# Patient Record
Sex: Male | Born: 1945 | Race: Black or African American | Hispanic: No | Marital: Married | Smoking: Former smoker
Health system: Southern US, Community
[De-identification: ages and names within clinical notes are randomized; demographics above are authoritative.]

## PROBLEM LIST (undated history)

## (undated) DIAGNOSIS — R569 Unspecified convulsions: Secondary | ICD-10-CM

## (undated) DIAGNOSIS — R64 Cachexia: Secondary | ICD-10-CM

## (undated) DIAGNOSIS — C3490 Malignant neoplasm of unspecified part of unspecified bronchus or lung: Secondary | ICD-10-CM

## (undated) DIAGNOSIS — Z720 Tobacco use: Secondary | ICD-10-CM

## (undated) DIAGNOSIS — Z77011 Contact with and (suspected) exposure to lead: Secondary | ICD-10-CM

## (undated) DIAGNOSIS — E278 Other specified disorders of adrenal gland: Secondary | ICD-10-CM

## (undated) DIAGNOSIS — E236 Other disorders of pituitary gland: Secondary | ICD-10-CM

## (undated) DIAGNOSIS — N183 Chronic kidney disease, stage 3 unspecified: Secondary | ICD-10-CM

## (undated) DIAGNOSIS — E871 Hypo-osmolality and hyponatremia: Secondary | ICD-10-CM

## (undated) DIAGNOSIS — J449 Chronic obstructive pulmonary disease, unspecified: Secondary | ICD-10-CM

## (undated) DIAGNOSIS — N400 Enlarged prostate without lower urinary tract symptoms: Secondary | ICD-10-CM

## (undated) DIAGNOSIS — N529 Male erectile dysfunction, unspecified: Secondary | ICD-10-CM

## (undated) DIAGNOSIS — I214 Non-ST elevation (NSTEMI) myocardial infarction: Secondary | ICD-10-CM

## (undated) DIAGNOSIS — D696 Thrombocytopenia, unspecified: Secondary | ICD-10-CM

## (undated) DIAGNOSIS — I10 Essential (primary) hypertension: Secondary | ICD-10-CM

## (undated) DIAGNOSIS — I200 Unstable angina: Secondary | ICD-10-CM

## (undated) DIAGNOSIS — I2510 Atherosclerotic heart disease of native coronary artery without angina pectoris: Secondary | ICD-10-CM

## (undated) HISTORY — PX: FINGER AMPUTATION: SHX636

---

## 1998-08-25 ENCOUNTER — Emergency Department (HOSPITAL_COMMUNITY): Admission: EM | Admit: 1998-08-25 | Discharge: 1998-08-25 | Payer: Self-pay | Admitting: Emergency Medicine

## 2004-06-19 ENCOUNTER — Ambulatory Visit: Payer: Self-pay | Admitting: Internal Medicine

## 2004-06-27 ENCOUNTER — Ambulatory Visit: Payer: Self-pay | Admitting: Internal Medicine

## 2006-08-14 ENCOUNTER — Emergency Department (HOSPITAL_COMMUNITY): Admission: EM | Admit: 2006-08-14 | Discharge: 2006-08-14 | Payer: Self-pay | Admitting: Emergency Medicine

## 2007-01-13 ENCOUNTER — Encounter: Admission: RE | Admit: 2007-01-13 | Discharge: 2007-01-13 | Payer: Self-pay | Admitting: Otolaryngology

## 2009-01-17 ENCOUNTER — Encounter: Admission: RE | Admit: 2009-01-17 | Discharge: 2009-01-17 | Payer: Self-pay | Admitting: Endocrinology

## 2009-07-20 ENCOUNTER — Inpatient Hospital Stay (HOSPITAL_COMMUNITY): Admission: EM | Admit: 2009-07-20 | Discharge: 2009-07-25 | Payer: Self-pay | Admitting: Internal Medicine

## 2009-09-21 ENCOUNTER — Emergency Department (HOSPITAL_COMMUNITY): Admission: EM | Admit: 2009-09-21 | Discharge: 2009-09-22 | Payer: Self-pay | Admitting: Emergency Medicine

## 2009-10-24 ENCOUNTER — Emergency Department (HOSPITAL_COMMUNITY): Admission: EM | Admit: 2009-10-24 | Discharge: 2009-10-24 | Payer: Self-pay | Admitting: Emergency Medicine

## 2009-11-24 DIAGNOSIS — I10 Essential (primary) hypertension: Secondary | ICD-10-CM | POA: Insufficient documentation

## 2009-11-24 DIAGNOSIS — N529 Male erectile dysfunction, unspecified: Secondary | ICD-10-CM | POA: Insufficient documentation

## 2009-11-24 HISTORY — DX: Male erectile dysfunction, unspecified: N52.9

## 2009-11-25 ENCOUNTER — Ambulatory Visit: Payer: Self-pay | Admitting: Pulmonary Disease

## 2009-11-25 DIAGNOSIS — G40909 Epilepsy, unspecified, not intractable, without status epilepticus: Secondary | ICD-10-CM

## 2009-11-25 DIAGNOSIS — R0602 Shortness of breath: Secondary | ICD-10-CM | POA: Insufficient documentation

## 2010-01-09 ENCOUNTER — Encounter: Payer: Self-pay | Admitting: Pulmonary Disease

## 2010-01-20 ENCOUNTER — Ambulatory Visit: Payer: Self-pay | Admitting: Pulmonary Disease

## 2010-02-08 ENCOUNTER — Ambulatory Visit: Payer: Self-pay | Admitting: Pulmonary Disease

## 2010-02-08 DIAGNOSIS — J438 Other emphysema: Secondary | ICD-10-CM

## 2010-04-16 ENCOUNTER — Encounter: Payer: Self-pay | Admitting: Otolaryngology

## 2010-04-27 NOTE — Letter (Signed)
Summary: Registered letter  Registered letter   Imported By: Lester Haughton 01/31/2010 07:29:28  _____________________________________________________________________  External Attachment:    Type:   Image     Comment:   External Document

## 2010-04-27 NOTE — Assessment & Plan Note (Signed)
Summary: consult for dyspnea   Copy to:  Cristian Moss Primary Provider/Referring Provider:  Adrian Moss  CC:  Pulmonary Consult.  History of Present Illness: The pt is a 65y/o male who I have been asked to see for dyspnea.  He is a lifelong smoker, and was hospitalized in April of this year with a LLL infiltrate most likely due to CAP.  He had a f/u cxr in June which showed clearing.  He has had chest congestion and doe for the last one year, and feels it may be getting worse.  He describes a 2 block doe on flat ground at a moderate pace, but denies getting winded bringing groceries in from the car or trash cans down to the street.  He has chronic am cough of white foamy mucus, and clears as the day goes on.  He denies any cardiac history, and has no issues with LE edema.    Preventive Screening-Counseling & Management  Alcohol-Tobacco     Smoking Status: current     Smoking Cessation Counseling: yes     Packs/Day: 1.0     Year Started: 65 y/o     Tobacco Counseling: to quit use of tobacco products  Current Medications (verified): 1)  Levetiracetam 750 Mg Tabs (Levetiracetam) .... Take 2 Tabs By Mouth Two Times A Day 2)  Depakote 500 Mg Tbec (Divalproex Sodium) .... Take 1 Tab By Mouth Each Morning and 2 Tabs By Mouth Each Evening 3)  Cabergoline 0.5 Mg Tabs (Cabergoline) .... Take 1 Tab By Mouth Twice Weekly 4)  Lasix 20 Mg Tabs (Furosemide) .... Take 1 Tablet By Mouth Once A Day As Needed 5)  Topamax 25 Mg Tabs (Topiramate) .... Take As Directed  Allergies (verified): No Known Drug Allergies  Past History:  Past Medical History:  SEIZURE DISORDER (ICD-780.39) HYPERTENSION (ICD-401.9) ERECTILE DYSFUNCTION, ORGANIC (ICD-607.84)    Past Surgical History: 5th finger surgery on L hand approx 1986  Family History: Reviewed history and no changes required. asthma: daughter  Social History: Reviewed history from 11/24/2009 and no changes required. pt is married pt has 3  children. pt is retired.  prev built batteries pt smoking 1 ppd since age 47.Smoking Status:  current Packs/Day:  1.0  Review of Systems       The patient complains of productive cough, nasal congestion/difficulty breathing through nose, sneezing, and hand/feet swelling.  The patient denies shortness of breath with activity, shortness of breath at rest, non-productive cough, coughing up blood, chest pain, irregular heartbeats, acid heartburn, indigestion, loss of appetite, weight change, abdominal pain, difficulty swallowing, sore throat, tooth/dental problems, headaches, itching, ear ache, anxiety, depression, joint stiffness or pain, rash, change in color of mucus, and fever.    Vital Signs:  Patient profile:   65 year old male Weight:      163.38 pounds O2 Sat:      93 % on Room air Temp:     98.6 degrees F oral Pulse rate:   79 / minute BP sitting:   118 / 62  (left arm) Cuff size:   regular  Vitals Entered By: Arman Filter LPN (November 25, 2009 11:27 AM)  O2 Flow:  Room air CC: Pulmonary Consult Comments Medications reviewed with patient Arman Filter LPN  November 25, 2009 11:27 AM    Physical Exam  General:  wd male in nad Eyes:  PERRLA and EOMI.   Nose:  patent without discharge Mouth:  clear  Neck:  no jvd, or  palpable LN Lungs:  decreased bs throughout, no wheezing or rhonchi  Heart:  rrr, no mrg Abdomen:  soft and nontender, bs+ Extremities:  mild ankle edema, no cyanosis  pulses intact distally Neurologic:  alert and oriented, moves all 4.   Impression & Recommendations:  Problem # 1:  DYSPNEA (ICD-786.05) the pt has doe that I suspect is secondary to underlying obstructive lung disease, but he will need pfts for documentation.  I have stressed to him the importance of smoking cessation, and how that will also help his other airway symptoms such as cough and congestion.  Will see him back after pfts to review.  Medications Added to Medication List This  Visit: 1)  Levetiracetam 750 Mg Tabs (Levetiracetam) .... Take 2 tabs by mouth two times a day 2)  Lasix 20 Mg Tabs (Furosemide) .... Take 1 tablet by mouth once a day as needed 3)  Topamax 25 Mg Tabs (Topiramate) .... Take as directed  Other Orders: Consultation Level IV (45409) Pulmonary Referral (Pulmonary) Tobacco use cessation intermediate 3-10 minutes (81191)  Patient Instructions: 1)  will schedule for breathing studies, and see you back in followup on the same day. 2)  stop smoking, it is the one thing that will make your breathing better.  Appended Document: consult for dyspnea pt needs ov to review pfts.Marland Kitchen.he was supposed to see me on same day as pfts.  Appended Document: consult for dyspnea pt coming in on 11/15/ at 10:15

## 2010-04-27 NOTE — Assessment & Plan Note (Signed)
Summary: Cristian Moss for emphysema   Copy to:  Adrian Prince Primary Provider/Referring Provider:  Adrian Prince  CC:  pt here to discuss pft results. pt c/o wheezing at night and cough with ocass clear phlem. pt states currently smokes 1ppd. .  History of Present Illness: the pt comes in today for f/u of his recent pfts.  He was found to have moderate to severe airflow obstruction, but a 23% improvement in FEV1 with bronchodilators.  He had no restriction, but did have airtrapping on lung volumes.  His DLCO was moderately reduced.  I have reviewed the study with him and his family, and answered all of his questions.  He continues to smoke excessively, and has ongoing breathing issues.  Preventive Screening-Counseling & Management  Alcohol-Tobacco     Smoking Status: current     Smoking Cessation Counseling: yes     Packs/Day: 1.0     Tobacco Counseling: to quit use of tobacco products  Comments: pt unable to be on wellbutrin due to seizure risk, or chantix due to ?med interaction.  Current Medications (verified): 1)  Depakote 500 Mg Tbec (Divalproex Sodium) .... Take 2 Tab By Mouth Each Morning and 2 Tabs By Mouth Each Evening 2)  Cabergoline 0.5 Mg Tabs (Cabergoline) .... Take 1 Tab By Mouth Twice Weekly 3)  Lasix 20 Mg Tabs (Furosemide) .... Take 1 Tablet By Mouth Once A Day As Needed 4)  Topamax 25 Mg Tabs (Topiramate) .... 2 Two Times A Day 5)  Mucinex 600 Mg Xr12h-Tab (Guaifenesin) .Marland Kitchen.. 1 Tablet Every 12 Hrs As Needed 6)  Tylenol Extra Strength 500 Mg Tabs (Acetaminophen) .Marland Kitchen.. 1-2 Tablets Every 6 Hrs As Needed  Allergies (verified): No Known Drug Allergies  Past History:  Past medical, surgical, family and social histories (including risk factors) reviewed, and no changes noted (except as noted below).  Past Medical History: Reviewed history from 11/25/2009 and no changes required.  SEIZURE DISORDER (ICD-780.39) HYPERTENSION (ICD-401.9) ERECTILE DYSFUNCTION, ORGANIC  (ICD-607.84)    Past Surgical History: Reviewed history from 11/25/2009 and no changes required. 5th finger surgery on L hand approx 1986  Family History: Reviewed history from 11/25/2009 and no changes required. asthma: daughter  Social History: Reviewed history from 11/25/2009 and no changes required. pt is married pt has 3 children. 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  Review of Systems       The patient complains of non-productive cough and nasal congestion/difficulty breathing through nose.  The patient denies shortness of breath with activity, shortness of breath at rest, productive cough, coughing up blood, chest pain, irregular heartbeats, acid heartburn, indigestion, loss of appetite, weight change, abdominal pain, difficulty swallowing, sore throat, tooth/dental problems, headaches, sneezing, itching, ear ache, anxiety, depression, hand/feet swelling, joint stiffness or pain, rash, change in color of mucus, and fever.    Vital Signs:  Patient profile:   65 year old male Height:      69 inches Weight:      164.13 pounds BMI:     24.33 O2 Sat:      99 % on Room air Pulse rate:   65 / minute BP sitting:   122 / 82  (left arm) Cuff size:   regular  Vitals Entered By: Carver Fila (February 08, 2010 4:05 PM)  O2 Flow:  Room air CC: pt here to discuss pft results. pt c/o wheezing at night, cough with ocass clear phlem. pt states currently smokes 1ppd.  Comments  meds and allergies updated Phone number updated Carver Fila  February 08, 2010 4:04 PM    Physical Exam  General:  wd male in nad Nose:  no purulence or discharge noted. Lungs:  rhonchi throughout with no wheezing Heart:  rrr, no mrg Extremities:  no significant edema or cyanosis Neurologic:  alert and oriented, moves all 4.   Impression & Recommendations:  Problem # 1:  EMPHYSEMA (ICD-492.8)  the pt has moderate to severe airflow obstruction on his pfts, but does have  a 23% improvement with bronchodilators.  I think he does have significant emphysema, but there is some reversibility related to airway inflammation from ongoing smoking.  I would like to start the pt on an aggressive bronchodilator regimen, and also have had a long discussion with him about smoking cessation.  He understands that if he does not quit smoking, he will not get better.  I have also given him the number to call about smoking cessation classes at the cancer center.  Medications Added to Medication List This Visit: 1)  Depakote 500 Mg Tbec (Divalproex sodium) .... Take 2 tab by mouth each morning and 2 tabs by mouth each evening 2)  Topamax 25 Mg Tabs (Topiramate) .... 2 two times a day 3)  Mucinex 600 Mg Xr12h-tab (Guaifenesin) .Marland Kitchen.. 1 tablet every 12 hrs as needed 4)  Tylenol Extra Strength 500 Mg Tabs (Acetaminophen) .Marland Kitchen.. 1-2 tablets every 6 hrs as needed 5)  Spiriva Handihaler 18 Mcg Caps (Tiotropium bromide monohydrate) .... One puff  in handihaler daily 6)  Symbicort 160-4.5 Mcg/act Aero (Budesonide-formoterol fumarate) .... Two puffs twice daily 7)  Proair Hfa 108 (90 Base) Mcg/act Aers (Albuterol sulfate) .... 2 puffs every 4-6 hours as needed  Other Orders: Est. Patient Level III (16109)  Patient Instructions: 1)  will start on a trial of spiriva one inhalation each am. 2)  trial of symbicort 160/4.5  2 inhalations am and pm....rinse mouth well 3)  albuterol inhaler for rescue only...2 puffs up to every 6hrs if needed for rescue. 4)  stop smoking 100%. 5)  followup with me in 3 mos.  Prescriptions: PROAIR HFA 108 (90 BASE) MCG/ACT  AERS (ALBUTEROL SULFATE) 2 puffs every 4-6 hours as needed  #1 x 6   Entered and Authorized by:   Barbaraann Share MD   Signed by:   Barbaraann Share MD on 02/08/2010   Method used:   Print then Give to Patient   RxID:   [id number] SYMBICORT 160-4.5 MCG/ACT  AERO (BUDESONIDE-FORMOTEROL FUMARATE) Two puffs twice daily  #1 x 6   Entered and  Authorized by:   Barbaraann Share MD   Signed by:   Barbaraann Share MD on 02/08/2010   Method used:   Print then Give to Patient   RxID:   [id number] SPIRIVA HANDIHALER 18 MCG  CAPS (TIOTROPIUM BROMIDE MONOHYDRATE) one puff  in handihaler daily  #30 x 6   Entered and Authorized by:   Barbaraann Share MD   Signed by:   Barbaraann Share MD on 02/08/2010   Method used:   Print then Give to Patient   RxID:   [id number]    Immunization History:  Influenza Immunization History:    Influenza:  historical (12/24/2008)

## 2010-04-27 NOTE — Letter (Signed)
SummaryScience writer Pulmonary Care Appointment Letter  [facility]  [address]   [place], [place] [address]   Phone: [phone]  Fax: [phone]    01/09/2010 MRN: [id number]  Cristian Moss [address] [place], [place]  [address]  Dear [name],   Our office is attempting to contact you about an appointment.  Please call our office at [phone] to schedule this appointment with Dr.________________.  Our registration staff is prepared to assist you with any questions you may have.    Thank you,   Nature conservation officer Pulmonary Division

## 2010-04-27 NOTE — Miscellaneous (Signed)
Summary: Orders Update pft charges  Clinical Lists Changes  Orders: Added new Service order of Carbon Monoxide diffusing w/capacity (94720) - Signed Added new Service order of Lung Volumes (94240) - Signed Added new Service order of Spirometry (Pre & Post) (94060) - Signed 

## 2010-05-10 ENCOUNTER — Ambulatory Visit: Payer: Self-pay | Admitting: Pulmonary Disease

## 2010-06-09 LAB — COMPREHENSIVE METABOLIC PANEL
Albumin: 3.2 g/dL — ABNORMAL LOW (ref 3.5–5.2)
Alkaline Phosphatase: 46 U/L (ref 39–117)
CO2: 28 mEq/L (ref 19–32)
Chloride: 94 mEq/L — ABNORMAL LOW (ref 96–112)
GFR calc Af Amer: >60 mL/min (ref >60)
GFR calc non Af Amer: >60 mL/min (ref >60)
Total Bilirubin: 0.5 mg/dL (ref 0.3–1.2)
Total Protein: 7 g/dL (ref 6.0–8.3)

## 2010-06-09 LAB — CBC
HCT: 31.7 % — ABNORMAL LOW (ref 39.0–52.0)
Hemoglobin: 11 g/dL — ABNORMAL LOW (ref 13.0–17.0)
MCH: 32.1 pg (ref 26.0–34.0)
WBC: 5.4 10*3/uL (ref 4.0–10.5)

## 2010-06-09 LAB — DIFFERENTIAL
Basophils Relative: 0 % (ref 0–1)
Eosinophils Absolute: 0.1 10*3/uL (ref 0.0–0.7)
Monocytes Absolute: 0.5 10*3/uL (ref 0.1–1.0)
Monocytes Relative: 9 % (ref 3–12)
Neutro Abs: 2.7 10*3/uL (ref 1.7–7.7)
Neutrophils Relative %: 51 % (ref 43–77)

## 2010-06-11 LAB — DIFFERENTIAL
Basophils Absolute: 0.1 10*3/uL (ref 0.0–0.1)
Basophils Relative: 1 % (ref 0–1)
Eosinophils Absolute: 0.1 10*3/uL (ref 0.0–0.7)
Eosinophils Relative: 2 % (ref 0–5)
Lymphocytes Relative: 31 % (ref 12–46)
Neutro Abs: 3.1 10*3/uL (ref 1.7–7.7)

## 2010-06-11 LAB — COMPREHENSIVE METABOLIC PANEL
AST: 18 U/L (ref 0–37)
Albumin: 3.2 g/dL — ABNORMAL LOW (ref 3.5–5.2)
Alkaline Phosphatase: 42 U/L (ref 39–117)
BUN: 15 mg/dL (ref 6–23)
CO2: 28 mEq/L (ref 19–32)
Calcium: 8.9 mg/dL (ref 8.4–10.5)
Chloride: 87 mEq/L — ABNORMAL LOW (ref 96–112)
GFR calc Af Amer: >60 mL/min (ref >60)
GFR calc non Af Amer: >60 mL/min (ref >60)
Glucose, Bld: 85 mg/dL (ref 70–99)
Total Bilirubin: 0.5 mg/dL (ref 0.3–1.2)
Total Protein: 6.8 g/dL (ref 6.0–8.3)

## 2010-06-11 LAB — CBC
Hemoglobin: 10.4 g/dL — ABNORMAL LOW (ref 13.0–17.0)
MCH: 32.5 pg (ref 26.0–34.0)
MCV: 94.1 fL (ref 78.0–100.0)

## 2010-06-11 LAB — URINALYSIS, ROUTINE W REFLEX MICROSCOPIC
Bilirubin Urine: NEGATIVE
Glucose, UA: NEGATIVE mg/dL
Hgb urine dipstick: NEGATIVE
Nitrite: NEGATIVE
Protein, ur: NEGATIVE mg/dL
Urobilinogen, UA: 1 mg/dL (ref 0.0–1.0)
pH: 6 (ref 5.0–8.0)

## 2010-06-11 LAB — VALPROIC ACID LEVEL: Valproic Acid Lvl: 65.2 ug/mL (ref 50.0–100.0)

## 2010-06-13 LAB — CLOSTRIDIUM DIFFICILE EIA: C difficile Toxins A+B, EIA: NEGATIVE

## 2010-06-13 LAB — CBC
HCT: 28.7 % — ABNORMAL LOW (ref 39.0–52.0)
HCT: 28.6 % — ABNORMAL LOW (ref 39.0–52.0)
HCT: 28.8 % — ABNORMAL LOW (ref 39.0–52.0)
HCT: 32.2 % — ABNORMAL LOW (ref 39.0–52.0)
Hemoglobin: 9.7 g/dL — ABNORMAL LOW (ref 13.0–17.0)
Hemoglobin: 9.5 g/dL — ABNORMAL LOW (ref 13.0–17.0)
MCHC: 33.7 g/dL (ref 30.0–36.0)
MCHC: 33.7 g/dL (ref 30.0–36.0)
MCV: 96.2 fL (ref 78.0–100.0)
Platelets: 157 10*3/uL (ref 150–400)
Platelets: 123 10*3/uL — ABNORMAL LOW (ref 150–400)
Platelets: 126 10*3/uL — ABNORMAL LOW (ref 150–400)
Platelets: 135 10*3/uL — ABNORMAL LOW (ref 150–400)
RBC: 2.93 MIL/uL — ABNORMAL LOW (ref 4.22–5.81)
RBC: 3.35 MIL/uL — ABNORMAL LOW (ref 4.22–5.81)
RBC: 3.85 MIL/uL — ABNORMAL LOW (ref 4.22–5.81)
RDW: 15.8 % — ABNORMAL HIGH (ref 11.5–15.5)
RDW: 16 % — ABNORMAL HIGH (ref 11.5–15.5)
RDW: 16.7 % — ABNORMAL HIGH (ref 11.5–15.5)
WBC: 8.1 10*3/uL (ref 4.0–10.5)
WBC: 7.3 10*3/uL (ref 4.0–10.5)
WBC: 9.3 10*3/uL (ref 4.0–10.5)

## 2010-06-13 LAB — LIPASE, BLOOD: Lipase: 24 U/L (ref 11–59)

## 2010-06-13 LAB — HEPATIC FUNCTION PANEL
Alkaline Phosphatase: 56 U/L (ref 39–117)
Bilirubin, Direct: 0.2 mg/dL (ref 0.0–0.3)
Indirect Bilirubin: 0.3 mg/dL (ref 0.3–0.9)
Total Protein: 6.1 g/dL (ref 6.0–8.3)

## 2010-06-13 LAB — COMPREHENSIVE METABOLIC PANEL
ALT: 15 U/L (ref 0–53)
ALT: 11 U/L (ref 0–53)
ALT: 13 U/L (ref 0–53)
ALT: 8 U/L (ref 0–53)
AST: 17 U/L (ref 0–37)
AST: 20 U/L (ref 0–37)
Albumin: 2.1 g/dL — ABNORMAL LOW (ref 3.5–5.2)
Albumin: 2 g/dL — ABNORMAL LOW (ref 3.5–5.2)
Albumin: 2.9 g/dL — ABNORMAL LOW (ref 3.5–5.2)
Alkaline Phosphatase: 53 U/L (ref 39–117)
Alkaline Phosphatase: 51 U/L (ref 39–117)
Alkaline Phosphatase: 58 U/L (ref 39–117)
BUN: 4 mg/dL — ABNORMAL LOW (ref 6–23)
BUN: 10 mg/dL (ref 6–23)
BUN: 16 mg/dL (ref 6–23)
CO2: 21 mEq/L (ref 19–32)
CO2: 27 mEq/L (ref 19–32)
Calcium: 9.2 mg/dL (ref 8.4–10.5)
Chloride: 110 mEq/L (ref 96–112)
Chloride: 114 mEq/L — ABNORMAL HIGH (ref 96–112)
Chloride: 116 mEq/L — ABNORMAL HIGH (ref 96–112)
GFR calc Af Amer: 45 mL/min — ABNORMAL LOW (ref >60)
Glucose, Bld: 84 mg/dL (ref 70–99)
Potassium: 4.5 mEq/L (ref 3.5–5.1)
Potassium: 4.8 mEq/L (ref 3.5–5.1)
Potassium: 5.1 mEq/L (ref 3.5–5.1)
Sodium: 138 mEq/L (ref 135–145)
Sodium: 141 mEq/L (ref 135–145)
Sodium: 144 mEq/L (ref 135–145)
Total Bilirubin: 0.5 mg/dL (ref 0.3–1.2)
Total Bilirubin: 0.3 mg/dL (ref 0.3–1.2)
Total Bilirubin: 0.4 mg/dL (ref 0.3–1.2)
Total Protein: 5.6 g/dL — ABNORMAL LOW (ref 6.0–8.3)
Total Protein: 5.6 g/dL — ABNORMAL LOW (ref 6.0–8.3)
Total Protein: 7.1 g/dL (ref 6.0–8.3)

## 2010-06-13 LAB — BASIC METABOLIC PANEL
BUN: 34 mg/dL — ABNORMAL HIGH (ref 6–23)
CO2: 33 mEq/L — ABNORMAL HIGH (ref 19–32)
Chloride: 109 mEq/L (ref 96–112)
Creatinine, Ser: 1.54 mg/dL — ABNORMAL HIGH (ref 0.4–1.5)
Potassium: 4.1 mEq/L (ref 3.5–5.1)

## 2010-06-13 LAB — DIFFERENTIAL
Eosinophils Absolute: 0 10*3/uL (ref 0.0–0.7)
Eosinophils Relative: 0 % (ref 0–5)
Lymphocytes Relative: 13 % (ref 12–46)
Lymphs Abs: 1.3 10*3/uL (ref 0.7–4.0)
Monocytes Absolute: 1.5 10*3/uL — ABNORMAL HIGH (ref 0.1–1.0)
Monocytes Relative: 14 % — ABNORMAL HIGH (ref 3–12)

## 2010-06-13 LAB — AMYLASE: Amylase: 64 U/L (ref 0–105)

## 2010-12-08 ENCOUNTER — Emergency Department (HOSPITAL_COMMUNITY)
Admission: EM | Admit: 2010-12-08 | Discharge: 2010-12-08 | Disposition: A | Discharge status: Home / Self Care | Payer: Medicare Other | Attending: Emergency Medicine | Admitting: Emergency Medicine

## 2010-12-08 DIAGNOSIS — I498 Other specified cardiac arrhythmias: Secondary | ICD-10-CM | POA: Insufficient documentation

## 2010-12-08 DIAGNOSIS — I10 Essential (primary) hypertension: Secondary | ICD-10-CM | POA: Insufficient documentation

## 2010-12-08 DIAGNOSIS — Z79899 Other long term (current) drug therapy: Secondary | ICD-10-CM | POA: Insufficient documentation

## 2010-12-08 DIAGNOSIS — G40909 Epilepsy, unspecified, not intractable, without status epilepticus: Secondary | ICD-10-CM | POA: Insufficient documentation

## 2010-12-08 LAB — CBC
HCT: 39.9 % (ref 39.0–52.0)
Hemoglobin: 13.2 g/dL (ref 13.0–17.0)
MCH: 29.2 pg (ref 26.0–34.0)
MCHC: 33.1 g/dL (ref 30.0–36.0)
MCV: 88.3 fL (ref 78.0–100.0)
Platelets: 140 10*3/uL — ABNORMAL LOW (ref 150–400)
RBC: 4.52 MIL/uL (ref 4.22–5.81)
RDW: 13.7 % (ref 11.5–15.5)
WBC: 5.6 10*3/uL (ref 4.0–10.5)

## 2010-12-08 LAB — BASIC METABOLIC PANEL
BUN: 12 mg/dL (ref 6–23)
CO2: 24 mEq/L (ref 19–32)
Calcium: 9.9 mg/dL (ref 8.4–10.5)
Chloride: 105 mEq/L (ref 96–112)
Creatinine, Ser: 1.01 mg/dL (ref 0.50–1.35)
GFR calc Af Amer: >60 mL/min (ref >60)
GFR calc non Af Amer: >60 mL/min (ref >60)
Glucose, Bld: 88 mg/dL (ref 70–99)
Potassium: 3.8 mEq/L (ref 3.5–5.1)
Sodium: 138 mEq/L (ref 135–145)

## 2010-12-08 LAB — URINALYSIS, ROUTINE W REFLEX MICROSCOPIC
Bilirubin Urine: NEGATIVE
Glucose, UA: NEGATIVE mg/dL
Hgb urine dipstick: NEGATIVE
Ketones, ur: NEGATIVE mg/dL
Leukocytes, UA: NEGATIVE
Nitrite: NEGATIVE
Protein, ur: NEGATIVE mg/dL
Specific Gravity, Urine: 1.014 (ref 1.005–1.030)
Urobilinogen, UA: 1 mg/dL (ref 0.0–1.0)
pH: 7 (ref 5.0–8.0)

## 2010-12-08 LAB — DIFFERENTIAL
Basophils Absolute: 0 10*3/uL (ref 0.0–0.1)
Basophils Relative: 0 % (ref 0–1)
Eosinophils Absolute: 0.1 10*3/uL (ref 0.0–0.7)
Eosinophils Relative: 2 % (ref 0–5)
Lymphocytes Relative: 40 % (ref 12–46)
Lymphs Abs: 2.2 10*3/uL (ref 0.7–4.0)
Monocytes Absolute: 0.5 10*3/uL (ref 0.1–1.0)
Monocytes Relative: 9 % (ref 3–12)
Neutro Abs: 2.8 10*3/uL (ref 1.7–7.7)
Neutrophils Relative %: 49 % (ref 43–77)

## 2010-12-14 ENCOUNTER — Other Ambulatory Visit: Payer: Self-pay | Admitting: Endocrinology

## 2010-12-14 DIAGNOSIS — G40909 Epilepsy, unspecified, not intractable, without status epilepticus: Secondary | ICD-10-CM

## 2010-12-19 ENCOUNTER — Ambulatory Visit
Admission: RE | Admit: 2010-12-19 | Discharge: 2010-12-19 | Disposition: A | Discharge status: Home / Self Care | Payer: Medicare Other | Source: Ambulatory Visit | Attending: Endocrinology | Admitting: Endocrinology

## 2010-12-19 DIAGNOSIS — G40909 Epilepsy, unspecified, not intractable, without status epilepticus: Secondary | ICD-10-CM

## 2010-12-19 MED ORDER — GADOBENATE DIMEGLUMINE 529 MG/ML IV SOLN
16.0000 mL | Freq: Once | INTRAVENOUS | Status: AC | PRN
  Administered 2010-12-19: 16 mL via INTRAVENOUS

## 2011-05-15 ENCOUNTER — Other Ambulatory Visit: Payer: Self-pay

## 2011-05-15 ENCOUNTER — Emergency Department (HOSPITAL_COMMUNITY)
Admission: EM | Admit: 2011-05-15 | Discharge: 2011-05-15 | Disposition: A | Discharge status: Home / Self Care | Payer: Medicare Other | Source: Home / Self Care | Attending: Emergency Medicine | Admitting: Emergency Medicine

## 2011-05-15 ENCOUNTER — Emergency Department (HOSPITAL_COMMUNITY): Payer: Medicare Other

## 2011-05-15 ENCOUNTER — Encounter (HOSPITAL_COMMUNITY): Payer: Self-pay | Admitting: *Deleted

## 2011-05-15 ENCOUNTER — Inpatient Hospital Stay (HOSPITAL_COMMUNITY)
Admission: EM | Admit: 2011-05-15 | Discharge: 2011-05-17 | DRG: 101 | Disposition: A | Discharge status: Home / Self Care | Payer: Medicare Other | Attending: Neurology | Admitting: Neurology

## 2011-05-15 DIAGNOSIS — G40909 Epilepsy, unspecified, not intractable, without status epilepticus: Secondary | ICD-10-CM | POA: Insufficient documentation

## 2011-05-15 DIAGNOSIS — R569 Unspecified convulsions: Secondary | ICD-10-CM

## 2011-05-15 DIAGNOSIS — R059 Cough, unspecified: Secondary | ICD-10-CM | POA: Insufficient documentation

## 2011-05-15 DIAGNOSIS — J3489 Other specified disorders of nose and nasal sinuses: Secondary | ICD-10-CM | POA: Insufficient documentation

## 2011-05-15 DIAGNOSIS — N400 Enlarged prostate without lower urinary tract symptoms: Secondary | ICD-10-CM | POA: Diagnosis present

## 2011-05-15 DIAGNOSIS — J4489 Other specified chronic obstructive pulmonary disease: Secondary | ICD-10-CM | POA: Diagnosis present

## 2011-05-15 DIAGNOSIS — J449 Chronic obstructive pulmonary disease, unspecified: Secondary | ICD-10-CM | POA: Diagnosis present

## 2011-05-15 DIAGNOSIS — R05 Cough: Secondary | ICD-10-CM | POA: Insufficient documentation

## 2011-05-15 DIAGNOSIS — Z79899 Other long term (current) drug therapy: Secondary | ICD-10-CM | POA: Insufficient documentation

## 2011-05-15 DIAGNOSIS — N189 Chronic kidney disease, unspecified: Secondary | ICD-10-CM | POA: Diagnosis present

## 2011-05-15 HISTORY — DX: Thrombocytopenia, unspecified: D69.6

## 2011-05-15 HISTORY — DX: Male erectile dysfunction, unspecified: N52.9

## 2011-05-15 HISTORY — DX: Unspecified convulsions: R56.9

## 2011-05-15 HISTORY — DX: Other disorders of pituitary gland: E23.6

## 2011-05-15 HISTORY — DX: Contact with and (suspected) exposure to lead: Z77.011

## 2011-05-15 HISTORY — DX: Hypo-osmolality and hyponatremia: E87.1

## 2011-05-15 HISTORY — DX: Benign prostatic hyperplasia without lower urinary tract symptoms: N40.0

## 2011-05-15 HISTORY — DX: Chronic obstructive pulmonary disease, unspecified: J44.9

## 2011-05-15 LAB — CBC
Hemoglobin: 12.9 g/dL — ABNORMAL LOW (ref 13.0–17.0)
MCH: 29.4 pg (ref 26.0–34.0)
MCHC: 32.7 g/dL (ref 30.0–36.0)
Platelets: 149 10*3/uL — ABNORMAL LOW (ref 150–400)
RBC: 4.39 MIL/uL (ref 4.22–5.81)

## 2011-05-15 LAB — POCT I-STAT, CHEM 8
BUN: 16 mg/dL (ref 6–23)
Creatinine, Ser: 1.1 mg/dL (ref 0.50–1.35)
Hemoglobin: 13.6 g/dL (ref 13.0–17.0)
Potassium: 3.9 mEq/L (ref 3.5–5.1)
Sodium: 141 mEq/L (ref 135–145)
TCO2: 22 mmol/L (ref 0–100)

## 2011-05-15 LAB — URINALYSIS, ROUTINE W REFLEX MICROSCOPIC
Hgb urine dipstick: NEGATIVE
Leukocytes, UA: NEGATIVE
Protein, ur: NEGATIVE mg/dL
Specific Gravity, Urine: 1.021 (ref 1.005–1.030)
Urobilinogen, UA: 1 mg/dL (ref 0.0–1.0)

## 2011-05-15 MED ORDER — TOPIRAMATE 100 MG PO TABS
100.0000 mg | ORAL_TABLET | Freq: Two times a day (BID) | ORAL | Status: DC
  Administered 2011-05-15 – 2011-05-16 (×2): 100 mg via ORAL
  Filled 2011-05-15 (×5): qty 1

## 2011-05-15 MED ORDER — OLMESARTAN MEDOXOMIL 20 MG PO TABS
20.0000 mg | ORAL_TABLET | Freq: Every day | ORAL | Status: DC
  Administered 2011-05-16 – 2011-05-17 (×2): 20 mg via ORAL
  Filled 2011-05-15 (×3): qty 1

## 2011-05-15 MED ORDER — LEVETIRACETAM 750 MG PO TABS
2000.0000 mg | ORAL_TABLET | Freq: Two times a day (BID) | ORAL | Status: DC
  Administered 2011-05-16 – 2011-05-17 (×3): 2000 mg via ORAL
  Filled 2011-05-15 (×6): qty 1

## 2011-05-15 MED ORDER — LEVETIRACETAM 750 MG PO TABS: 2000.0000 mg | ORAL_TABLET | Freq: Two times a day (BID) | ORAL | Status: DC

## 2011-05-15 MED ORDER — LORAZEPAM 2 MG/ML IJ SOLN
1.0000 mg | Freq: Once | INTRAMUSCULAR | Status: AC
  Administered 2011-05-15: 1 mg via INTRAVENOUS
  Filled 2011-05-15: qty 1

## 2011-05-15 MED ORDER — BROMOCRIPTINE MESYLATE 2.5 MG PO TABS
2.5000 mg | ORAL_TABLET | Freq: Every day | ORAL | Status: DC
  Administered 2011-05-17: 2.5 mg via ORAL
  Filled 2011-05-15 (×3): qty 1

## 2011-05-15 MED ORDER — LEVETIRACETAM 500 MG/5ML IV SOLN
2000.0000 mg | Freq: Once | INTRAVENOUS | Status: AC
  Administered 2011-05-15: 2000 mg via INTRAVENOUS
  Filled 2011-05-15: qty 20

## 2011-05-15 NOTE — H&P (Signed)
Reason for Consult: "seizure"  HPI: Cristian Moss is an 66 y.o. male with history of complex partial seizures who comes in with 4 seizures since 12:00 am this last night.  Patient's wife says that he has been compliant with his meds. Topamax 100 mg PO bid, Keppra 1500 mg PO bid. Each episode is his stereotypical event with staring with repetitive movements that is followed by confusion.  Past Medical History   Diagnosis  Date   .  Hypertension    .  Seizures    Medications: I have reviewed the patient's current medications.  History reviewed. No pertinent past surgical history.  No family history on file.  Social History: does not have a smoking history on file. He does not have any smokeless tobacco history on file. His alcohol and drug histories not on file.  Allergies: No Known Allergies  ROS: as above  Blood pressure 136/86, pulse 73, temperature 98.7 F (37.1 C), temperature source Oral, resp. rate 18.  Neurological exam: AAO*3. No aphasia. Followed complex commands. Cranial nerves: EOMI, PERRL. Visual fields were full. Sensation to V1 through V3 areas of the face was intact and symmetric throughout. There was no facial asymmetry. Shoulder shrug was 5/5 and symmetric bilaterally. Head rotation was 5/5 bilaterally. There was no dysarthria or palatal deviation. Motor: strength was 5/5 and symmetric throughout. Sensory: was intact throughout to light touch, pinprick. Coordination: finger-to-nose were intact and symmetric bilaterally. Reflexes: were 2+ in upper extremities and 2+ at the knees and 1+ at the ankles. Plantar response was downgoing bilaterally. Gait: deferred  Results for orders placed during the hospital encounter of 05/15/11 (from the past 48 hour(s))   CBC Status: Abnormal    Collection Time    05/15/11 3:13 AM   Component  Value  Range  Comment    WBC  4.9  4.0 - 10.5 (K/uL)     RBC  4.39  4.22 - 5.81 (MIL/uL)     Hemoglobin  12.9 (*)  13.0 - 17.0 (g/dL)     HCT  62.9  52.8 -  52.0 (%)     MCV  89.7  78.0 - 100.0 (fL)     MCH  29.4  26.0 - 34.0 (pg)     MCHC  32.7  30.0 - 36.0 (g/dL)     RDW  41.3  24.4 - 15.5 (%)     Platelets  149 (*)  150 - 400 (K/uL)    POCT I-STAT, CHEM 8 Status: Abnormal    Collection Time    05/15/11 3:30 AM   Component  Value  Range  Comment    Sodium  141  135 - 145 (mEq/L)     Potassium  3.9  3.5 - 5.1 (mEq/L)     Chloride  110  96 - 112 (mEq/L)     BUN  16  6 - 23 (mg/dL)     Creatinine, Ser  0.10  0.50 - 1.35 (mg/dL)     Glucose, Bld  272 (*)  70 - 99 (mg/dL)     Calcium, Ion  5.36  1.12 - 1.32 (mmol/L)     TCO2  22  0 - 100 (mmol/L)     Hemoglobin  13.6  13.0 - 17.0 (g/dL)     HCT  64.4  03.4 - 52.0 (%)    Dg Chest 2 View  05/15/2011 *RADIOLOGY REPORT* Clinical Data: Cough; seizures. CHEST - 2 VIEW Comparison: Chest radiograph performed 09/21/2009 Findings: The lungs are  well-aerated. Minimal lower lobe density on the lateral view is stable from 2011 and likely reflects atelectasis or scarring. An 8 mm nodule at the left midlung is stable in appearance from 2010. There is no evidence of pleural effusion or pneumothorax. Bilateral nipple shadows are noted. The heart is normal in size; the mediastinal contour is within normal limits. No acute osseous abnormalities are seen. The unusual appearance of the left hemidiaphragm is thought to reflect the superior position of the antrum of the stomach, as noted on prior CT of the chest from 01/17/2009. IMPRESSION: No acute cardiopulmonary process seen; chronic changes as described above. Original Report Authenticated By: Tonia Ghent, M.D.  Ct Head Wo Contrast  05/15/2011 *RADIOLOGY REPORT* Clinical Data: Possible seizures; apparent recent diagnosis of pituitary tumor. CT HEAD WITHOUT CONTRAST Technique: Contiguous axial images were obtained from the base of the skull through the vertex without contrast. Comparison: CT of the head performed 10/24/2009, and MRI of the brain performed 12/19/2010  Findings: There is no evidence of acute infarction, mass lesion, or intra- or extra-axial hemorrhage on CT. 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 Prominence of the sulci reflects mild cortical volume loss. Mild cerebellar atrophy is noted. Scattered periventricular white matter change likely reflects small vessel ischemic microangiopathy. 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 The posterior fossa, including the cerebellum, brainstem and fourth ventricle, is within normal limits. The cerebral hemispheres demonstrate grossly normal gray-white differentiation. No mass effect or midline shift is seen. There is no evidence of fracture; visualized osseous structures are unremarkable in appearance. The orbits are within normal limits. There is partial opacification of the left maxillary sinus; the remaining paranasal sinuses and mastoid air cells are well-aerated. No significant soft tissue abnormalities are seen. IMPRESSION: 1. No acute intracranial pathology seen on CT. 2. Mild cortical volume loss and scattered small vessel ischemic microangiopathy. 3. 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 4. 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 5. Partial opacification of the left maxillary sinus. Original Report Authenticated By: Tonia Ghent, M.D.  Assessment/Plan:  66 years old man who comes in with 4 partial complex seizures since 12 am today - patient is compliant with medication according to his wife  1) Will admit patient for observation  2) Increase Keppra to 2G bid  3) If patient has no significant side-effects from Keppra, will increase Topamax to 150 mg PO bid  4) Seizure precautions  5) Fall precautions  Yolonda Purtle  05/15/2011, 2:05 PM

## 2011-05-15 NOTE — ED Provider Notes (Addendum)
History     CSN: [id number]  Arrival date & time 05/15/11  1156   First MD Initiated Contact with Patient 05/15/11 1235      Chief Complaint  Patient presents with  . Seizures    (Consider location/radiation/quality/duration/timing/severity/associated sxs/prior treatment) Patient is a 66 y.o. male presenting with seizures. The history is provided by the patient and a relative.  Seizures  This is a recurrent problem. Pertinent negatives include no headaches, no chest pain, no nausea, no vomiting and no diarrhea.   patient returns for seizures. He was seen earlier today for same. He has episodes where he sits and becomes unresponsive. No tonic-clonic activities. Family states he sometimes scoots on the floor pushes away. States it lasted about 5 minutes and has had more episodes today. Family states they were at home calling the neurologist when he had another episode. No urinary incontinence. No postictal phase. Patient states does not remember any of this. She's had previous workup for the seizures and is on Keppra. He is on 1500 mg twice a day. He is also on bromocriptine. 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 They state that the nurse at the neurologist's office stated that he need to be admitted to the hospital and get an MRI.  Past Medical History  Diagnosis Date  . Hypertension   . Seizures   . COPD (chronic obstructive pulmonary disease)   . Thrombocytopenia   . 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 BPH (benign prostatic hypertrophy)   . Erectile dysfunction   . Empty sella syndrome     History of  . Chronic kidney disease   . Hyponatremia     history of    Past Surgical History  Procedure Date  . 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 No pertinent family history.  History  Substance Use Topics  . Smoking status: Current Everyday Smoker -- 1.0 packs/day for 47 years    Types: Cigarettes  . Smokeless tobacco: Never Used  . Alcohol Use: No      Review of  Systems  Constitutional: Negative for activity change and appetite change.  HENT: Negative for neck stiffness.   Eyes: Negative for pain.  Respiratory: Negative for chest tightness and shortness of breath.   Cardiovascular: Negative for chest pain and leg swelling.  Gastrointestinal: Negative for nausea, vomiting, abdominal pain and diarrhea.  Genitourinary: Negative for flank pain.  Musculoskeletal: Negative for back pain.  Skin: Negative for rash.  Neurological: Positive for seizures. Negative for syncope, weakness, numbness and headaches.  Psychiatric/Behavioral: Negative for behavioral problems.    Allergies  Review of patient's allergies indicates no known allergies.  Home Medications   Current Outpatient Rx  Name Route Sig Dispense Refill  . BROMOCRIPTINE MESYLATE 2.5 MG PO TABS Oral Take 2.5 mg by mouth daily.    Marland Kitchen LEVETIRACETAM 750 MG PO TABS Oral Take 1,500 mg by mouth 2 (two) times daily.     Marland Kitchen OLMESARTAN MEDOXOMIL 20 MG PO TABS Oral Take 20 mg by mouth daily.    . TOPIRAMATE 50 MG PO TABS Oral Take 100 mg by mouth 2 (two) times daily.      BP 136/86  Pulse 73  Temp(Src) 98.7 F (37.1 C) (Oral)  Resp 18  Physical Exam  Nursing note and vitals reviewed. Constitutional: He is oriented to person, place, and time. He appears well-developed and well-nourished.  HENT:  Head: Normocephalic and atraumatic.  Neck: Normal range of motion. Neck  supple.  Cardiovascular: Normal rate, regular rhythm and normal heart sounds.   No murmur heard. Pulmonary/Chest: Effort normal and breath sounds normal.  Abdominal: Soft. Bowel sounds are normal. He exhibits no distension and no mass. There is no tenderness. There is no rebound and no guarding.  Musculoskeletal: Normal range of motion. He exhibits no edema.  Neurological: He is alert and oriented to person, place, and time. No cranial nerve deficit.  Skin: Skin is warm and dry.  Psychiatric: He has a normal mood and affect.     ED Course  Procedures (including critical care time)   Labs Reviewed  URINALYSIS, ROUTINE W REFLEX MICROSCOPIC   Dg Chest 2 View  05/15/2011  *RADIOLOGY REPORT*  Clinical Data: Cough; seizures.  CHEST - 2 VIEW  Comparison: Chest radiograph performed 09/21/2009  Findings: The lungs are well-aerated.  Minimal lower lobe density on the lateral view is stable from 2011 and likely reflects atelectasis or scarring.  An 8 mm nodule at the left midlung is stable in appearance from 2010.  There is no evidence of pleural effusion or pneumothorax.  Bilateral nipple shadows are noted.  The heart is normal in size; the mediastinal contour is within normal limits.  No acute osseous abnormalities are seen.  The unusual appearance of the left hemidiaphragm is thought to reflect the superior position of the antrum of the stomach, as noted on prior CT of the chest from 01/17/2009.  IMPRESSION: No acute cardiopulmonary process seen; chronic changes as described above.  Original Report Authenticated By: Tonia Ghent, M.D.   Ct Head Wo Contrast  05/15/2011  *RADIOLOGY REPORT*  Clinical Data: Possible seizures; apparent recent diagnosis of pituitary tumor.  CT HEAD WITHOUT CONTRAST  Technique:  Contiguous axial images were obtained from the base of the skull through the vertex without contrast.  Comparison: CT of the head performed 10/24/2009, and MRI of the brain performed 12/19/2010  Findings: There is no evidence of acute infarction, mass lesion, or intra- or extra-axial hemorrhage on CT.  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  Prominence of the sulci reflects mild cortical volume loss. Mild cerebellar atrophy is noted.  Scattered periventricular white matter change likely reflects small vessel ischemic microangiopathy.  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  The posterior fossa,  including the cerebellum, brainstem and fourth ventricle, is within normal limits.  The cerebral hemispheres demonstrate grossly normal gray-white differentiation.  No mass effect or midline shift is seen.  There is no evidence of fracture; visualized osseous structures are unremarkable in appearance.  The orbits are within normal limits. There is partial opacification of the left maxillary sinus; the remaining paranasal sinuses and mastoid air cells are well-aerated. No significant soft tissue abnormalities are seen.  IMPRESSION:  1.  No acute intracranial pathology seen on CT. 2.  Mild cortical volume loss and scattered small vessel ischemic microangiopathy. 3.  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 4.  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 5.  Partial opacification of the left maxillary sinus.  Original Report Authenticated By: Tonia Ghent, M.D.     1. 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 Seen earlier today for the same. Already on Keppra and Topamax. Seen by Dr. Lyman Speller in the ED and he. He recommended medicine admission. The patient's primary care physician requested a neurology admission. He will be transferred to [facility] and admitted to Dr Lyman Speller.  Juliet Rude. Rubin Payor, MD 05/15/11 1544  Juliet Rude. Rubin Payor, MD 05/15/11 1545

## 2011-05-15 NOTE — ED Provider Notes (Signed)
History     CSN: [id number]  Arrival date & time 05/15/11  0221   First MD Initiated Contact with Patient 05/15/11 [phone]      Chief Complaint  Patient presents with  . Seizures     HPI  History provided by the patient and spouse. Patient is a 66 year old male with history of hypertension seizure disorder presents with symptoms of seizure earlier this evening. His wife reports that patient had 2 episodes of seizure activity while at home. She she reports first occurred while sitting and watching TV. Patient had typical seizure symptoms of unresponsiveness and a blank stare with no symptoms of shaking or tonic-clonic movements. There was no tongue biting or urinary or fecal incontinence. Symptoms lasted a short time. Patient's wife states she did call 911 and EMS checked over patient and his vital signs found to be normal. Patient was feeling well and went to bed. She is wife states that while in bed preparing for sleep patient had another episode that appeared to be another seizure but this time had associated tonic movements of upper arms.  Symptoms again lasted a brief moment. There was no associated incontinence. There was no postictal phase. She normally takes Keppra for seizures and has been taking this regularly as prescribed. She does report having some URI symptoms recently with nasal congestion and cough. He denies any fevers.    Past Medical History  Diagnosis Date  . Hypertension   . Seizures     History reviewed. No pertinent past surgical history.  History reviewed. No pertinent family history.  History  Substance Use Topics  . Smoking status: Not on file  . Smokeless tobacco: Not on file  . Alcohol Use:       Review of Systems  Constitutional: Negative for fever and chills.  HENT: Positive for congestion and rhinorrhea.   Respiratory: Positive for cough.   All other systems reviewed and are negative.    Allergies  Review of patient's allergies indicates no  known allergies.  Home Medications   Current Outpatient Rx  Name Route Sig Dispense Refill  . BROMOCRIPTINE MESYLATE 2.5 MG PO TABS Oral Take 2.5 mg by mouth daily.    Marland Kitchen LEVETIRACETAM 750 MG PO TABS Oral Take 750 mg by mouth 2 (two) times daily.    Marland Kitchen OLMESARTAN MEDOXOMIL 20 MG PO TABS Oral Take 20 mg by mouth daily.    . TOPIRAMATE 50 MG PO TABS Oral Take 100 mg by mouth 2 (two) times daily.      BP 143/77  Pulse 73  Temp(Src) 98.7 F (37.1 C) (Oral)  Resp 18  SpO2 100%  Physical Exam  Nursing note and vitals reviewed. Constitutional: He is oriented to person, place, and time. He appears well-developed and well-nourished. No distress.  HENT:  Head: Normocephalic and atraumatic.  Mouth/Throat: Oropharynx is clear and moist.       No bite marks to tongue  Neck: Normal range of motion. Neck supple.  Cardiovascular: Normal rate and regular rhythm.   Pulmonary/Chest: Effort normal and breath sounds normal. No respiratory distress.  Abdominal: Soft. He exhibits no distension. There is no tenderness. There is no rebound.  Neurological: He is alert and oriented to person, place, and time. He has normal strength. No cranial nerve deficit or sensory deficit. Coordination normal.  Skin: Skin is warm. No rash noted.  Psychiatric: He has a normal mood and affect. His behavior is normal.    ED Course  Procedures  Results for orders placed during the hospital encounter of 05/15/11  CBC      Component Value Range   WBC 4.9  4.0 - 10.5 (K/uL)   RBC 4.39  4.22 - 5.81 (MIL/uL)   Hemoglobin 12.9 (*) 13.0 - 17.0 (g/dL)   HCT 96.0  45.4 - 09.8 (%)   MCV 89.7  78.0 - 100.0 (fL)   MCH 29.4  26.0 - 34.0 (pg)   MCHC 32.7  30.0 - 36.0 (g/dL)   RDW 11.9  14.7 - 82.9 (%)   Platelets 149 (*) 150 - 400 (K/uL)  POCT I-STAT, CHEM 8      Component Value Range   Sodium 141  135 - 145 (mEq/L)   Potassium 3.9  3.5 - 5.1 (mEq/L)   Chloride 110  96 - 112 (mEq/L)   BUN 16  6 - 23 (mg/dL)    Creatinine, Ser 5.62  0.50 - 1.35 (mg/dL)   Glucose, Bld 130 (*) 70 - 99 (mg/dL)   Calcium, Ion 8.65  7.84 - 1.32 (mmol/L)   TCO2 22  0 - 100 (mmol/L)   Hemoglobin 13.6  13.0 - 17.0 (g/dL)   HCT 69.6  29.5 - 28.4 (%)      Dg Chest 2 View  05/15/2011  *RADIOLOGY REPORT*  Clinical Data: Cough; seizures.  CHEST - 2 VIEW  Comparison: Chest radiograph performed 09/21/2009  Findings: The lungs are well-aerated.  Minimal lower lobe density on the lateral view is stable from 2011 and likely reflects atelectasis or scarring.  An 8 mm nodule at the left midlung is stable in appearance from 2010.  There is no evidence of pleural effusion or pneumothorax.  Bilateral nipple shadows are noted.  The heart is normal in size; the mediastinal contour is within normal limits.  No acute osseous abnormalities are seen.  The unusual appearance of the left hemidiaphragm is thought to reflect the superior position of the antrum of the stomach, as noted on prior CT of the chest from 01/17/2009.  IMPRESSION: No acute cardiopulmonary process seen; chronic changes as described above.  Original Report Authenticated By: Tonia Ghent, M.D.   Ct Head Wo Contrast  05/15/2011  *RADIOLOGY REPORT*  Clinical Data: Possible seizures; apparent recent diagnosis of pituitary tumor.  CT HEAD WITHOUT CONTRAST  Technique:  Contiguous axial images were obtained from the base of the skull through the vertex without contrast.  Comparison: CT of the head performed 10/24/2009, and MRI of the brain performed 12/19/2010  Findings: There is no evidence of acute infarction, mass lesion, or intra- or extra-axial hemorrhage on CT.  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  Prominence of the sulci reflects mild cortical volume loss. Mild cerebellar atrophy is noted.  Scattered periventricular white matter change likely reflects small vessel ischemic  microangiopathy.  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  The posterior fossa, including the cerebellum, brainstem and fourth ventricle, is within normal limits.  The cerebral hemispheres demonstrate grossly normal gray-white differentiation.  No mass effect or midline shift is seen.  There is no evidence of fracture; visualized osseous structures are unremarkable in appearance.  The orbits are within normal limits. There is partial opacification of the left maxillary sinus; the remaining paranasal sinuses and mastoid air cells are well-aerated. No significant soft tissue abnormalities are seen.  IMPRESSION:  1.  No acute intracranial pathology  seen on CT. 2.  Mild cortical volume loss and scattered small vessel ischemic microangiopathy. 3.  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 4.  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 5.  Partial opacification of the left maxillary sinus.  Original Report Authenticated By: Tonia Ghent, M.D.     1. Seizure       MDM  3:40 AM patient seen and evaluated. Patient in no acute distress. Patient awake and alert with no focal neuro deficits.   Patient discussed with attending physician. He agrees with workup and treatment plan. Patient with unremarkable labs CT scan and chest x-ray. He has been compliant on meds. Patient will call neurology specialist later today for a followup appointment.       Angus Seller, [place] 05/15/11 [phone]  Medical screening examination/treatment/procedure(s) were conducted as a shared visit with non-physician practitioner(s) and myself.  I personally evaluated the patient during the encounter. No neuro deficits on exam and returning to baseline. No clinical meningismus or symptoms of infection. Plan close neurology followup no seizures in the emergency department.  Sunnie Nielsen,  MD 05/15/11 [phone]

## 2011-05-15 NOTE — ED Notes (Signed)
Pt back from Ct

## 2011-05-15 NOTE — ED Notes (Signed)
Tried to obtain urine sample. Patient unable at this time.

## 2011-05-15 NOTE — Discharge Instructions (Signed)
You were seen and evaluated today for your seizure symptoms. This time your lab tests have not shown any concerning cause her symptoms. Please continue to take your medications as prescribed. Please contact your neurology specialist later today for continued evaluation and treatment of your symptoms. Do not drive or operate machinery or put yourself in a place may be harmful should you have additional seizures.  Epilepsy A seizure (convulsion) is a sudden change in brain function that causes a change in behavior, muscle activity, or ability to remain awake and alert. If a person has recurring seizures, this is called epilepsy. CAUSES  Epilepsy is a disorder with many possible causes. Anything that disturbs the normal pattern of brain cell activity can lead to seizures. Seizure can be caused from illness to brain damage to abnormal brain development. Epilepsy may develop because of:  An abnormality in brain wiring.   An imbalance of nerve signaling chemicals (neurotransmitters).   Some combination of these factors.  Scientists are learning an increasing amount about genetic causes of seizures. SYMPTOMS  The symptoms of a seizure can vary greatly from one person to another. These may include:  An aura, or warning that tells a person they are about to have a seizure.   Abnormal sensations, such as abnormal smell or seeing flashing lights.   Sudden, general body stiffness.   Rhythmic jerking of the face, arm, or leg - on one or both sides.   Sudden change in consciousness.   The person may appear to be awake but not responding.   They may appear to be asleep but cannot be awakened.   Grimacing, chewing, lip smacking, or drooling.   Often there is a period of sleepiness after a seizure.  DIAGNOSIS  The description you give to your caregiver about what you experienced will help them understand your problems. Equally important is the description by any witnesses to your seizure. A physical  exam, including a detailed neurological exam, is necessary. An EEG (electroencephalogram) is a painless test of your brain waves. In this test a diagram is created of your brain waves. These diagrams can be interpreted by a specialist. Pictures of your brain are usually taken with:  An MRI.   A CT scan.  Lab tests may be done to look for:  Signs of infection.   Abnormal blood chemistry.  PREVENTION  There is no way to prevent the development of epilepsy. If you have seizures that are typically triggered by an event (such as flashing lights), try to avoid the trigger. This can help you avoid a seizure.  PROGNOSIS  Most people with epilepsy lead outwardly normal lives. While epilepsy cannot currently be cured, for some people it does eventually go away. Most seizures do not cause brain damage. It is not uncommon for people with epilepsy, especially children, to develop behavioral and emotional problems. These problems are sometimes the consequence of medicine for seizures or social stress. For some people with epilepsy, the risk of seizures restricts their independence and recreational activities. For example, some states refuse drivers licenses to people with epilepsy. Most women with epilepsy can become pregnant. They should discuss their epilepsy and the medicine they are taking with their caregivers. Women with epilepsy have a 90 percent or better chance of having a normal, healthy baby. RISKS AND COMPLICATIONS  People with epilepsy are at increased risk of falls, accidents, and injuries. People with epilepsy are at special risk for two life-threatening conditions. These are status epilepticus and sudden  unexplained death (extremely rare). Status epilepticus is a long lasting, continuous seizure that is a medical emergency. TREATMENT  Once epilepsy is diagnosed, it is important to begin treatment as soon as possible. For about 80 percent of those diagnosed with epilepsy, seizures can be  controlled with modern medicines and surgical techniques. Some antiepileptic drugs can interfere with the effectiveness of oral contraceptives. In 1997, the FDA approved a pacemaker for the brain the (vagus nerve stimulator). This stimulator can be used for people with seizures that are not well-controlled by medicine. Studies have shown that in some cases, children may experience fewer seizures if they maintain a strict diet. The strict diet is called the ketogenic diet. This diet is rich in fats and low in carbohydrates. HOME CARE INSTRUCTIONS   Your caregiver will make recommendations about driving and safety in normal activities. Follow these carefully.   Take any medicine prescribed exactly as directed.   Do any blood tests requested to monitor the levels of your medicine.   The people you live and work with should know that you are prone to seizures. They should receive instructions on how to help you. In general, a witness to a seizure should:   Cushion your head and body.   Turn you on your side.   Avoid unnecessarily restraining you.   Not place anything inside your mouth.   Call for local emergency medical help if there is any question about what has occurred.   Keep a seizure diary. Record what you recall about any seizure, especially any possible trigger.   If your caregiver has given you a follow-up appointment, it is very important to keep that appointment. Not keeping the appointment could result in permanent injury and disability. If there is any problem keeping the appointment, you must call back to this facility for assistance.  SEEK MEDICAL CARE IF:   You develop signs of infection or other illness. This might increase the risk of a seizure.   You seem to be having more frequent seizures.   Your seizure pattern is changing.  SEEK IMMEDIATE MEDICAL CARE IF:   A seizure does not stop after a few moments.   A seizure causes any difficulty in breathing.   A seizure  results in a very severe headache.   A seizure leaves you with the inability to speak or use a part of your body.  MAKE SURE YOU:   Understand these instructions.   Will watch your condition.   Will get help right away if you are not doing well or get worse.  Document Released: 03/12/2005 Document Revised: 11/22/2010 Document Reviewed: 10/17/2007 [facility] Patient Information 2012 [place], [place].

## 2011-05-15 NOTE — ED Notes (Signed)
WUX:[id number]> Expected date:<BR> Expected time:<BR> Means of arrival:<BR> Comments:<BR> EMS/seizure

## 2011-05-15 NOTE — ED Notes (Signed)
Pt to be admitted to 3016. Bed is ready.

## 2011-05-15 NOTE — ED Notes (Signed)
Neurology at bedside seeing pt.

## 2011-05-15 NOTE — ED Notes (Signed)
EMS called to pt's home twice by pt's wife. Wife states he "goes off into space." EMS states pt is A&O x4, grips equal bilaterally, good stregnth in both arms and legs. EMS reports all neuro assessments negative. cbg within normal limits. Pt has recent dx of a brain mass. Unknown if mass is malignant or benign.

## 2011-05-15 NOTE — ED Notes (Signed)
Patient transported to CT 

## 2011-05-15 NOTE — Consult Note (Signed)
Reason for Consult: "seizure"  HPI: Cristian Moss is an 65 y.o. male with history of complex partial seizures who comes in with 4 seizures since 12:00 am this last night.  Patient's wife says that he has been compliant with his meds. Topamax 100 mg PO bid, Keppra 1500 mg PO bid. Each episode is his stereotypical event with staring with repetitive movements that is followed by confusion.  Past Medical History   Diagnosis  Date   .  Hypertension    .  Seizures    Medications: I have reviewed the patient's current medications.  History reviewed. No pertinent past surgical history.  No family history on file.  Social History: does not have a smoking history on file. He does not have any smokeless tobacco history on file. His alcohol and drug histories not on file.  Allergies: No Known Allergies  ROS: as above  Blood pressure 136/86, pulse 73, temperature 98.7 F (37.1 C), temperature source Oral, resp. rate 18.  Neurological exam: AAO*3. No aphasia. Followed complex commands. Cranial nerves: EOMI, PERRL. Visual fields were full. Sensation to V1 through V3 areas of the face was intact and symmetric throughout. There was no facial asymmetry. Shoulder shrug was 5/5 and symmetric bilaterally. Head rotation was 5/5 bilaterally. There was no dysarthria or palatal deviation. Motor: strength was 5/5 and symmetric throughout. Sensory: was intact throughout to light touch, pinprick. Coordination: finger-to-nose were intact and symmetric bilaterally. Reflexes: were 2+ in upper extremities and 2+ at the knees and 1+ at the ankles. Plantar response was downgoing bilaterally. Gait: deferred  Results for orders placed during the hospital encounter of 05/15/11 (from the past 48 hour(s))   CBC Status: Abnormal    Collection Time    05/15/11 3:13 AM   Component  Value  Range  Comment    WBC  4.9  4.0 - 10.5 (K/uL)     RBC  4.39  4.22 - 5.81 (MIL/uL)     Hemoglobin  12.9 (*)  13.0 - 17.0 (g/dL)     HCT  39.4  39.0 -  52.0 (%)     MCV  89.7  78.0 - 100.0 (fL)     MCH  29.4  26.0 - 34.0 (pg)     MCHC  32.7  30.0 - 36.0 (g/dL)     RDW  14.2  11.5 - 15.5 (%)     Platelets  149 (*)  150 - 400 (K/uL)    POCT I-STAT, CHEM 8 Status: Abnormal    Collection Time    05/15/11 3:30 AM   Component  Value  Range  Comment    Sodium  141  135 - 145 (mEq/L)     Potassium  3.9  3.5 - 5.1 (mEq/L)     Chloride  110  96 - 112 (mEq/L)     BUN  16  6 - 23 (mg/dL)     Creatinine, Ser  1.10  0.50 - 1.35 (mg/dL)     Glucose, Bld  100 (*)  70 - 99 (mg/dL)     Calcium, Ion  1.26  1.12 - 1.32 (mmol/L)     TCO2  22  0 - 100 (mmol/L)     Hemoglobin  13.6  13.0 - 17.0 (g/dL)     HCT  40.0  39.0 - 52.0 (%)    Dg Chest 2 View  05/15/2011 *RADIOLOGY REPORT* Clinical Data: Cough; seizures. CHEST - 2 VIEW Comparison: Chest radiograph performed 09/21/2009 Findings: The lungs are   well-aerated. Minimal lower lobe density on the lateral view is stable from 2011 and likely reflects atelectasis or scarring. An 8 mm nodule at the left midlung is stable in appearance from 2010. There is no evidence of pleural effusion or pneumothorax. Bilateral nipple shadows are noted. The heart is normal in size; the mediastinal contour is within normal limits. No acute osseous abnormalities are seen. The unusual appearance of the left hemidiaphragm is thought to reflect the superior position of the antrum of the stomach, as noted on prior CT of the chest from 01/17/2009. IMPRESSION: No acute cardiopulmonary process seen; chronic changes as described above. Original Report Authenticated By: JEFFREY CHANG, M.D.  Ct Head Wo Contrast  05/15/2011 *RADIOLOGY REPORT* Clinical Data: Possible seizures; apparent recent diagnosis of pituitary tumor. CT HEAD WITHOUT CONTRAST Technique: Contiguous axial images were obtained from the base of the skull through the vertex without contrast. Comparison: CT of the head performed 10/24/2009, and MRI of the brain performed 12/19/2010  Findings: There is no evidence of acute infarction, mass lesion, or intra- or extra-axial hemorrhage on CT. 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 Prominence of the sulci reflects mild cortical volume loss. Mild cerebellar atrophy is noted. Scattered periventricular white matter change likely reflects small vessel ischemic microangiopathy. 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 The posterior fossa, including the cerebellum, brainstem and fourth ventricle, is within normal limits. The cerebral hemispheres demonstrate grossly normal gray-white differentiation. No mass effect or midline shift is seen. There is no evidence of fracture; visualized osseous structures are unremarkable in appearance. The orbits are within normal limits. There is partial opacification of the left maxillary sinus; the remaining paranasal sinuses and mastoid air cells are well-aerated. No significant soft tissue abnormalities are seen. IMPRESSION: 1. No acute intracranial pathology seen on CT. 2. Mild cortical volume loss and scattered small vessel ischemic microangiopathy. 3. 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 4. 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 5. Partial opacification of the left maxillary sinus. Original Report Authenticated By: JEFFREY CHANG, M.D.  Assessment/Plan:  65-years-old man who comes in with 4 partial complex seizures since 12 am today - patient is compliant with medication according to his wife  1) Will admit patient for observation  2) Increase Keppra to 2G bid  3) If patient has no significant side-effects from Keppra, will increase Topamax to 150 mg PO bid  4) Seizure precautions  5) Fall precautions  Cristian Moss  05/15/2011, 2:05 PM     

## 2011-05-15 NOTE — ED Notes (Signed)
Pt and family report pt was discharged this am from [facility] ED for seizures and was told to follow up with MD and neurologist. Family states pt had seizure after leaving ED this am and was instructed by MD to come back to ED. Pt alert and oriented at this time x 4. Family states pt had blank stared and was "unresponsive", for approx 5 minutes. Pt has been taking meds as prescribed.

## 2011-05-15 NOTE — ED Notes (Signed)
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

## 2011-05-16 ENCOUNTER — Inpatient Hospital Stay (HOSPITAL_COMMUNITY): Payer: Medicare Other

## 2011-05-16 MED ORDER — GADOBENATE DIMEGLUMINE 529 MG/ML IV SOLN
15.0000 mL | Freq: Once | INTRAVENOUS | Status: AC
  Administered 2011-05-16: 15 mL via INTRAVENOUS

## 2011-05-16 MED ORDER — LORAZEPAM 2 MG/ML IJ SOLN
INTRAMUSCULAR | Status: AC
  Administered 2011-05-16: 1 mg via INTRAVENOUS
  Filled 2011-05-16: qty 1

## 2011-05-16 MED ORDER — PHENYTOIN SODIUM EXTENDED 100 MG PO CAPS
300.0000 mg | ORAL_CAPSULE | Freq: Every day | ORAL | Status: DC
  Administered 2011-05-17: 300 mg via ORAL
  Filled 2011-05-16: qty 3

## 2011-05-16 MED ORDER — POTASSIUM CHLORIDE 20 MEQ PO PACK: 40.0000 meq | PACK | Freq: Once | ORAL | Status: DC

## 2011-05-16 MED ORDER — SODIUM CHLORIDE 0.9 % IV SOLN
1200.0000 mg | Freq: Once | INTRAVENOUS | Status: AC
  Administered 2011-05-16: 1200 mg via INTRAVENOUS
  Filled 2011-05-16 (×2): qty 24

## 2011-05-16 MED ORDER — LORAZEPAM 2 MG/ML IJ SOLN
INTRAMUSCULAR | Status: AC
  Administered 2011-05-16: 1 mg
  Filled 2011-05-16: qty 1

## 2011-05-16 MED ORDER — POTASSIUM CHLORIDE CRYS ER 20 MEQ PO TBCR
40.0000 meq | EXTENDED_RELEASE_TABLET | Freq: Once | ORAL | Status: AC
  Administered 2011-05-16: 40 meq via ORAL
  Filled 2011-05-16: qty 2

## 2011-05-16 MED ORDER — PHENYTOIN SODIUM EXTENDED 100 MG PO CAPS: 300.0000 mg | ORAL_CAPSULE | Freq: Every day | ORAL | Status: DC

## 2011-05-16 MED ORDER — LORAZEPAM 2 MG/ML IJ SOLN
2.0000 mg | Freq: Once | INTRAMUSCULAR | Status: AC
  Administered 2011-05-16: 1 mg via INTRAVENOUS

## 2011-05-16 MED ORDER — SODIUM CHLORIDE 0.9 % IV SOLN: 1000.0000 mg | Freq: Once | INTRAVENOUS | Status: DC

## 2011-05-16 MED ORDER — PHENYTOIN SODIUM EXTENDED 100 MG PO CAPS
300.0000 mg | ORAL_CAPSULE | Freq: Every day | ORAL | Status: AC
  Administered 2011-05-16: 300 mg via ORAL
  Filled 2011-05-16: qty 3

## 2011-05-16 NOTE — Progress Notes (Signed)
EEG completed at bedside w/ video. 

## 2011-05-16 NOTE — Consult Note (Signed)
Subjective: Patient had urinary incontinence last night that he does not recall. It may have been another seizure event.   Objective: Vital signs in last 24 hours: Temp:  [97.3 F (36.3 C)-99 F (37.2 C)] 97.3 F (36.3 C) (02/20 0600) Pulse Rate:  [59-80] 80  (02/20 0600) Resp:  [15-20] 20  (02/20 0600) BP: (103-154)/(67-89) 103/76 mmHg (02/20 0600) SpO2:  [97 %-100 %] 97 % (02/20 0600) FiO2 (%):  [2 %] 2 % (02/19 1732) Weight:  [74.449 kg (164 lb 2.1 oz)] 74.449 kg (164 lb 2.1 oz) (02/19 1900)   Nutritional status: Cardiac  Neurological exam: AAO*3. No aphasia. Followed complex commands. Cranial nerves: EOMI, PERRL. Visual fields were full. Sensation to V1 through V3 areas of the face was intact and symmetric throughout. There was no facial asymmetry. Shoulder shrug was 5/5 and symmetric bilaterally. Head rotation was 5/5 bilaterally. There was no dysarthria or palatal deviation. Motor: strength was 5/5 and symmetric throughout. Sensory: was intact throughout to light touch, pinprick. Coordination: finger-to-nose were intact and symmetric bilaterally. Reflexes: were 2+ in upper extremities and 2+ at the knees and 1+ at the ankles. Plantar response was downgoing bilaterally. Gait: deferred   Lab Results:  Basename 05/15/11 0330 05/15/11 0313  WBC -- 4.9  HGB 13.6 12.9*  HCT 40.0 39.4  PLT -- 149*  NA 141 --  K 3.9 --  CL 110 --  CO2 -- --  GLUCOSE 100* --  BUN 16 --  CREATININE 1.10 --  CALCIUM -- --  LABA1C -- --   Dg Chest 2 View  05/15/2011  *RADIOLOGY REPORT*  Clinical Data: Cough; seizures.  CHEST - 2 VIEW  Comparison: Chest radiograph performed 09/21/2009  Findings: The lungs are well-aerated.  Minimal lower lobe density on the lateral view is stable from 2011 and likely reflects atelectasis or scarring.  An 8 mm nodule at the left midlung is stable in appearance from 2010.  There is no evidence of pleural effusion or pneumothorax.  Bilateral nipple shadows are noted.  The  heart is normal in size; the mediastinal contour is within normal limits.  No acute osseous abnormalities are seen.  The unusual appearance of the left hemidiaphragm is thought to reflect the superior position of the antrum of the stomach, as noted on prior CT of the chest from 01/17/2009.  IMPRESSION: No acute cardiopulmonary process seen; chronic changes as described above.  Original Report Authenticated By: Tonia Ghent, M.D.   Ct Head Wo Contrast  05/15/2011  *RADIOLOGY REPORT*  Clinical Data: Possible seizures; apparent recent diagnosis of pituitary tumor.  CT HEAD WITHOUT CONTRAST  Technique:  Contiguous axial images were obtained from the base of the skull through the vertex without contrast.  Comparison: CT of the head performed 10/24/2009, and MRI of the brain performed 12/19/2010  Findings: There is no evidence of acute infarction, mass lesion, or intra- or extra-axial hemorrhage on CT.  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  Prominence of the sulci reflects mild cortical volume loss. Mild cerebellar atrophy is noted.  Scattered periventricular white matter change likely reflects small vessel ischemic microangiopathy.  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  The posterior fossa, including the cerebellum, brainstem and fourth ventricle, is within normal limits.  The cerebral hemispheres demonstrate grossly normal gray-white differentiation.  No mass effect or midline shift is seen.  There  is no evidence of fracture; visualized osseous structures are unremarkable in appearance.  The orbits are within normal limits. There is partial opacification of the left maxillary sinus; the remaining paranasal sinuses and mastoid air cells are well-aerated. No significant soft tissue abnormalities are seen.  IMPRESSION:  1.  No acute intracranial pathology seen on CT. 2.  Mild  cortical volume loss and scattered small vessel ischemic microangiopathy. 3.  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 4.  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 5.  Partial opacification of the left maxillary sinus.  Original Report Authenticated By: Tonia Ghent, M.D.   Medications: I have reviewed the patient's current medications.  Assessment/Plan: 66 years old man with complex partial seizures that comes in with 5 seizures in the last 36 hours. 1) Will stop Topamax and switch to Dilantin with load of 1200 mg IV * 1 followed by 300 ER tonight and 300 ER daily from tomorrow 2) Cont. Keppra at 2000 mg PO bid 3) EEG 4) MRI brain with contrast  LOS: 1 day   Gerhart Ruggieri

## 2011-05-17 LAB — BASIC METABOLIC PANEL
BUN: 13 mg/dL (ref 6–23)
Creatinine, Ser: 1.08 mg/dL (ref 0.50–1.35)
GFR calc Af Amer: 80 mL/min — ABNORMAL LOW (ref >90)
GFR calc non Af Amer: 69 mL/min — ABNORMAL LOW (ref >90)
Potassium: 3.7 mEq/L (ref 3.5–5.1)

## 2011-05-17 LAB — CBC
HCT: 35.4 % — ABNORMAL LOW (ref 39.0–52.0)
MCHC: 32.8 g/dL (ref 30.0–36.0)
Platelets: 131 10*3/uL — ABNORMAL LOW (ref 150–400)
RDW: 14.2 % (ref 11.5–15.5)

## 2011-05-17 MED ORDER — PHENYTOIN SODIUM EXTENDED 300 MG PO CAPS: 300.0000 mg | ORAL_CAPSULE | Freq: Every day | ORAL | Status: DC

## 2011-05-17 MED ORDER — LEVETIRACETAM 1000 MG PO TABS: 2000.0000 mg | ORAL_TABLET | Freq: Two times a day (BID) | ORAL | Status: DC

## 2011-05-17 NOTE — Procedures (Signed)
EEG NUMBER:  [id number].  HISTORY:  This is a 66 year old man with history of partial complex seizures.  MEDICATIONS:  Topamax, Keppra, and Dilantin.  CONDITION OF RECORDING:  This 16-lead EEG was recorded with the patient in an awake and drowsy states.  Background rhythms; background patterns in wakefulness were well organized with a well-sustained posterior dominant rhythm of 12 Hz, symmetrical and reactive to eye opening and closing. Drowsiness was associated with mild attenuation of voltage and slowing Of frequencies.  Abnormal potentials: no epileptiform activity or focal slowing was noted.  ACTIVATION PROCEDURES:  Hyperventilation was not performed.  Photic stimulation did not activate tracing.  EKG:  Single-channel of EKG monitoring was unremarkable.  IMPRESSION:  This is a normal awake and drowsy EEG.  A normal EEG does not rule out the clinical diagnosis of epilepsy.  If clinically warranted, a repeat extended EEG or ambulatory recording may be obtained for prolonged recording times and sleep capture, which may increase The diagnostic yield.  Clinical correlation is suggested.          ______________________________ Carmell Austria, MD    [id number] D:  05/16/2011 13:39:10  T:  05/16/2011 21:05:41  Job #:  [id number]

## 2011-05-17 NOTE — Discharge Summary (Signed)
Physician Discharge Summary   Patient ID: Cristian Moss [id number] 65 y.o. 01/09/1946  Admit date: 05/15/2011  Discharge date and time: 05/17/2011, 6:07 PM  Admitting Physician: Carmell Austria, MD   Discharge Physician: Thana Farr, MD  Admission Diagnoses: Seizure [780.39] SEZIURE  Discharge Diagnoses: Seizure  Admission Condition: fair  Discharged Condition: good  Indication for Admission: No return to baseline status  Hospital Course: Patient admitted secondary to recurrent seizures without return to baseline mental status.  Was on Topamax and Keppra.  Reportedly was compliant.  Topamax discontinued and patient started on Dilantin.  IV load given.  Patient maintained on 300mg  a day.  Discharge level of 14.8.  Patient without complaints and seizure free for greater than 24 hours.    Consults: None  Significant Diagnostic Studies: MRI HEAD WITHOUT AND WITH CONTRAST-IMPRESSION:  Markedly motion degraded exam without acute infarct or obvious  intracranial mass.      Treatments: IV hydration  Discharge Exam:   Disposition: 01-Home or Self Care  Patient Instructions:  Medication List  As of 05/17/2011  6:07 PM   STOP taking these medications         topiramate 50 MG tablet         TAKE these medications         bromocriptine 2.5 MG tablet   Commonly known as: PARLODEL   Take 2.5 mg by mouth daily.      levETIRAcetam 1000 MG tablet   Commonly known as: KEPPRA   Take 2 tablets (2,000 mg total) by mouth every 12 (twelve) hours.      olmesartan 20 MG tablet   Commonly known as: BENICAR   Take 20 mg by mouth daily.      phenytoin 300 MG ER capsule   Commonly known as: DILANTIN   Take 1 capsule (300 mg total) by mouth daily.           Activity: Patient unable to drive, operate heavy machinery, perform activities at heights and participate in water activities until release by outpatient physician. Diet: regular diet Wound Care: none needed  Follow-up  with outpatient neurologist in 2 weeks.  Patient to make appointment.  Signed: Thana Farr, MD [facility] [phone] 05/17/2011 6:07 PM

## 2011-05-17 NOTE — Progress Notes (Signed)
Utilization review completed. Paisley Grajeda, RN, BSN. 05/17/11 

## 2011-05-17 NOTE — Progress Notes (Addendum)
[facility] PROGRESS NOTE    SUBJECTIVE   No further seizure activity over night. Doing well on Dilantin and Keppra.   OBJECTIVE   Vital signs in last 24 hours: Temp:  [97.5 F (36.4 C)-99.4 F (37.4 C)] 98.3 F (36.8 C) (02/21 0531) Pulse Rate:  [74-78] 75  (02/21 0531) Resp:  [18] 18  (02/21 0531) BP: (105-144)/(63-95) 116/77 mmHg (02/21 0531) SpO2:  [96 %-100 %] 100 % (02/21 0531)  Intake/Output from previous day: 02/20 0701 - 02/21 0700 In: 250 [P.O.:250] Out: -  Intake/Output this shift:   Nutritional status: Cardiac  Past Medical History  Diagnosis Date  . Hypertension   . Seizures   . COPD (chronic obstructive pulmonary disease)   . Thrombocytopenia   . 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 BPH (benign prostatic hypertrophy)   . Erectile dysfunction   . Empty sella syndrome     History of  . Chronic kidney disease   . Hyponatremia     history of    Neurologic Exam:  Mental Status: Alert, oriented, thought content appropriate.  Speech fluent without evidence of aphasia. Able to follow 3 step commands without difficulty. Cranial Nerves: II-Visual fields grossly intact. III/IV/VI-Extraocular movements intact.  Pupils reactive bilaterally. V/VII-Smile symmetric VIII-grossly intact IX/X-normal gag XI-bilateral shoulder shrug XII-midline tongue extension Motor: 5/5 bilaterally with normal tone and bulk Sensory: Pinprick and light touch intact throughout, bilaterally Deep Tendon Reflexes: 2+ and symmetric throughout Plantars: Downgoing bilaterally Cerebellar: Normal finger-to-nose,normal heel-to-shin test.    Lab Results: No results found for this basename: cbc, bmp, coags, chol, tri, ldl, hga1c   Lipid Panel No results found for this basename: CHOL,TRIG,HDL,CHOLHDL,VLDL,LDLCALC in the last 72 hours  Studies/Results: Mr Cristian Moss Contrast  05/16/2011  *RADIOLOGY REPORT*  Clinical Data: History of seizures.   Altered mental status questionable history of pituitary tumor.  MRI HEAD WITHOUT AND WITH CONTRAST  Technique:  Multiplanar, multiecho pulse sequences of the brain and surrounding structures were obtained according to standard protocol without and with intravenous contrast  Contrast: 15mL MULTIHANCE GADOBENATE DIMEGLUMINE 529 MG/ML IV SOLN  Comparison: 05/15/2011 head CT.  12/19/2010 brain MR.  Findings: Markedly motion degraded exam. The patient refused complete imaging.  Partially empty sella once again noted.  No discrete pituitary mass identified on this motion degraded exam.  No acute infarct.  Blood breakdown products superior right lenticular nucleus/anterior right corona radiata unchanged.  This may be related to prior hemorrhagic infarct or bleed.  No other evidence of intracranial hemorrhage.  Cerebrospinal fluid spaces with bony remodeling anterior right middle cranial fossa unchanged from 2008 CT.  Etiology/significance indeterminate.  Prominent small vessel disease type changes.  Global atrophy without hydrocephalus.  No obvious mass detected on the significantly motion degraded postcontrast images.  Poor delineation of the right vertebral artery.  Ectatic left vertebral artery and basilar artery.  Internal carotid arteries are patent.  Major dural sinuses appear patent.  Mild exophthalmos.  Opacification left maxillary sinus which may contain central inspissated material or fungal disease.  IMPRESSION: Markedly motion degraded exam without acute infarct or obvious intracranial mass.  Please see above.  Original Report Authenticated By: Fuller Canada, M.D.    Medications:     Scheduled:   . bromocriptine  2.5  mg Oral Daily  . gadobenate dimeglumine  15 mL Intravenous Once  . levETIRAcetam  2,000 mg Oral Q12H  . LORazepam      . LORazepam  2 mg Intravenous Once  . olmesartan  20 mg Oral Daily  . phenytoin (DILANTIN) IV  1,200 mg Intravenous Once  . phenytoin  300 mg Oral QHS  .  phenytoin  300 mg Oral Daily  . potassium chloride  40 mEq Oral Once    Assessment/Plan:    66 years old man with complex partial seizures that comes in with 5 seizures 1) Stopped Topamax and switched to Dilantin 300 ER daily with recent level 14.8 doing well on Dilantin 2) Cont. Keppra at 2000 mg PO bid  3) EEG showed normal awake and drowsy no seizure activity 4) MRI brain with contrast showed Blood breakdown products superior right lenticular nucleus/anterior  right corona radiata unchanged. No acute infarct. 5) I have had discussion with family about switched medication, SE of Dilantin, and that he is to Follow up with his out patient neurologist after D/C    Felicie Morn PA-C [facility] [phone]  05/17/2011, 9:55 AM    I agree with the above.  Thana Farr, MD [facility] [phone]  05/17/2011  2:43 PM

## 2012-02-11 ENCOUNTER — Emergency Department (HOSPITAL_COMMUNITY)
Admission: EM | Admit: 2012-02-11 | Discharge: 2012-02-11 | Disposition: A | Discharge status: Home / Self Care | Payer: Medicare Other | Attending: Emergency Medicine | Admitting: Emergency Medicine

## 2012-02-11 ENCOUNTER — Encounter (HOSPITAL_COMMUNITY): Payer: Self-pay | Admitting: *Deleted

## 2012-02-11 DIAGNOSIS — E871 Hypo-osmolality and hyponatremia: Secondary | ICD-10-CM | POA: Insufficient documentation

## 2012-02-11 DIAGNOSIS — I10 Essential (primary) hypertension: Secondary | ICD-10-CM | POA: Insufficient documentation

## 2012-02-11 DIAGNOSIS — N189 Chronic kidney disease, unspecified: Secondary | ICD-10-CM | POA: Insufficient documentation

## 2012-02-11 DIAGNOSIS — I129 Hypertensive chronic kidney disease with stage 1 through stage 4 chronic kidney disease, or unspecified chronic kidney disease: Secondary | ICD-10-CM | POA: Insufficient documentation

## 2012-02-11 DIAGNOSIS — F17200 Nicotine dependence, unspecified, uncomplicated: Secondary | ICD-10-CM | POA: Insufficient documentation

## 2012-02-11 DIAGNOSIS — Z87448 Personal history of other diseases of urinary system: Secondary | ICD-10-CM | POA: Insufficient documentation

## 2012-02-11 DIAGNOSIS — R569 Unspecified convulsions: Secondary | ICD-10-CM

## 2012-02-11 DIAGNOSIS — Z862 Personal history of diseases of the blood and blood-forming organs and certain disorders involving the immune mechanism: Secondary | ICD-10-CM | POA: Insufficient documentation

## 2012-02-11 DIAGNOSIS — Z79899 Other long term (current) drug therapy: Secondary | ICD-10-CM | POA: Insufficient documentation

## 2012-02-11 LAB — CBC WITH DIFFERENTIAL/PLATELET
Basophils Relative: 0 % (ref 0–1)
Eosinophils Absolute: 0.1 10*3/uL (ref 0.0–0.7)
HCT: 33.9 % — ABNORMAL LOW (ref 39.0–52.0)
Hemoglobin: 11.4 g/dL — ABNORMAL LOW (ref 13.0–17.0)
MCH: 30.4 pg (ref 26.0–34.0)
MCHC: 33.6 g/dL (ref 30.0–36.0)
Monocytes Absolute: 0.9 10*3/uL (ref 0.1–1.0)
Neutro Abs: 4.4 10*3/uL (ref 1.7–7.7)

## 2012-02-11 LAB — BASIC METABOLIC PANEL
BUN: 16 mg/dL (ref 6–23)
Calcium: 9.5 mg/dL (ref 8.4–10.5)
GFR calc non Af Amer: 62 mL/min — ABNORMAL LOW (ref >90)
Glucose, Bld: 91 mg/dL (ref 70–99)

## 2012-02-11 LAB — URINALYSIS, ROUTINE W REFLEX MICROSCOPIC
Glucose, UA: NEGATIVE mg/dL
Hgb urine dipstick: NEGATIVE
Ketones, ur: NEGATIVE mg/dL
Protein, ur: NEGATIVE mg/dL

## 2012-02-11 MED ORDER — SODIUM CHLORIDE 0.9 % IV SOLN
1000.0000 mg | Freq: Once | INTRAVENOUS | Status: AC
  Administered 2012-02-11: 1000 mg via INTRAVENOUS
  Filled 2012-02-11: qty 20

## 2012-02-11 NOTE — ED Notes (Signed)
Pt c/o 2/10 headache with "ringing in his ears" that is constant. Hx of seizures since 5 years ago. Pt comes to ER d/t seizure x3 since yesterday. Pt A&Ox4, ambulatory.

## 2012-02-11 NOTE — ED Notes (Signed)
Pt has history of seizures and takes dilantin and states that seizures have been controlled until last nite.  Pt has had 3 seizures since last nite.  Think that stress has precipitated the seizures

## 2012-02-11 NOTE — ED Provider Notes (Signed)
History     CSN: [id number]  Arrival date & time 02/11/12  1520   First MD Initiated Contact with Patient 02/11/12 1743      Chief Complaint  Patient presents with  . Seizures    (Consider location/radiation/quality/duration/timing/severity/associated sxs/prior treatment) HPI Comments: Patient with a history of Seizure Disorder presents today with a chief complaint of seizure.  He reports that he had three seizures since last evening.  Seizures witnessed by his wife.  Each seizure lasted 1-2 minutes and then resolved.  First seizure occurred while patient was in bed and the other two seizures occurred while the patient was in a recliner.  Patient did not fall or hit his head when he seized.   No bowel or bladder incontinence.  Patient is currently on Dilantin for his seizures and reports that he has been taking his medication as directed.  He denies any recent illnesses.  No fever or chills.  Wife states that he has been under increased stress recently, which has been a trigger for his seizures in the past.  He denies headache or confusion at this time.  He reports that he is back to baseline at this time.  He states that he is feeling fine and is requesting to be discharged.  His Neurologist is Dr. Antonietta Barcelona with [facility].    Patient is a 66 y.o. male presenting with seizures. The history is provided by the patient.  Seizures  Pertinent negatives include no confusion, no headaches, no visual disturbance, no chest pain, no nausea and no vomiting.    Past Medical History  Diagnosis Date  . Hypertension   . Seizures   . COPD (chronic obstructive pulmonary disease)   . Thrombocytopenia   . 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 BPH (benign prostatic hypertrophy)   . Erectile dysfunction   . Empty sella syndrome     History of  . Chronic kidney disease   . Hyponatremia     history of    Past Surgical History  Procedure Date  . 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  History  Substance Use Topics  . Smoking status: Current Every Day Smoker -- 1.0 packs/day for 47 years    Types: Cigarettes  . Smokeless tobacco: Never Used  . Alcohol Use: No      Review of Systems  Constitutional: Negative for fever and chills.  Eyes: Negative for visual disturbance.  Respiratory: Negative for shortness of breath.   Cardiovascular: Negative for chest pain.  Gastrointestinal: Negative for nausea and vomiting.  Neurological: Positive for seizures. Negative for dizziness, weakness, light-headedness and headaches.  Psychiatric/Behavioral: Negative for confusion.  All other systems reviewed and are negative.    Allergies  Review of patient's allergies indicates no known allergies.  Home Medications   Current Outpatient Rx  Name  Route  Sig  Dispense  Refill  . BROMOCRIPTINE MESYLATE 2.5 MG PO TABS   Oral   Take 2.5 mg by mouth daily.         Marland Kitchen LEVETIRACETAM 1000 MG PO TABS   Oral   Take 2 tablets (2,000 mg total) by mouth every 12 (twelve) hours.   120 tablet   2   . OLMESARTAN MEDOXOMIL 20 MG PO TABS   Oral   Take 20 mg by mouth daily.         Marland Kitchen PHENYTOIN SODIUM EXTENDED 300 MG PO CAPS   Oral  Take 1 capsule (300 mg total) by mouth daily.   30 capsule   2     BP 142/72  Pulse 70  Temp 99.3 F (37.4 C) (Oral)  Resp 18  SpO2 99%  Physical Exam  Nursing note and vitals reviewed. Constitutional: He is oriented to person, place, and time. He appears well-developed and well-nourished. No distress.  HENT:  Head: Normocephalic and atraumatic.  Mouth/Throat: Oropharynx is clear and moist.  Eyes: EOM are normal. Pupils are equal, round, and reactive to light.  Neck: Normal range of motion. Neck supple.  Cardiovascular: Normal rate, regular rhythm, normal heart sounds and intact distal pulses.   No murmur heard. Pulmonary/Chest: Effort normal and breath sounds normal. No respiratory distress. He has no wheezes.  Abdominal:  Soft. There is no tenderness.  Musculoskeletal: Normal range of motion.  Neurological: He is alert and oriented to person, place, and time. He has normal strength. No cranial nerve deficit or sensory deficit. Gait normal.  Skin: Skin is warm and dry. No rash noted. He is not diaphoretic.  Psychiatric: He has a normal mood and affect.    ED Course  Procedures (including critical care time)  Labs Reviewed  PHENYTOIN LEVEL, TOTAL - Abnormal; Notable for the following:    Phenytoin Lvl 4.1 (*)     All other components within normal limits  CBC WITH DIFFERENTIAL - Abnormal; Notable for the following:    RBC 3.75 (*)     Hemoglobin 11.4 (*)     HCT 33.9 (*)     All other components within normal limits  BASIC METABOLIC PANEL - Abnormal; Notable for the following:    GFR calc non Af Amer 62 (*)     GFR calc Af Amer 71 (*)     All other components within normal limits  URINALYSIS, ROUTINE W REFLEX MICROSCOPIC   No results found.   No diagnosis found.    MDM  Patient with a history of seizure disorder presents today with a chief complaint of seizure.  Labs unremarkable.  He was back to baseline upon arrival in the ED.  Phenytoin level was found to be low at 4.1.  Therefore, patient given IV Dilantin while in the ED.  Patient then discharged home.  Patient instructed to take medications as prescribed and to follow up with his Neurologist.          Magnus Sinning, PA-C 02/12/12 1404

## 2012-02-12 NOTE — ED Provider Notes (Signed)
Medical screening examination/treatment/procedure(s) were performed by non-physician practitioner and as supervising physician I was immediately available for consultation/collaboration.  Chirsty Armistead T Omaira Mellen, MD 02/12/12 2240 

## 2012-06-25 ENCOUNTER — Ambulatory Visit: Payer: Medicare Other | Admitting: Neurology

## 2012-07-28 ENCOUNTER — Encounter: Payer: Self-pay | Admitting: Neurology

## 2012-07-28 ENCOUNTER — Ambulatory Visit (INDEPENDENT_AMBULATORY_CARE_PROVIDER_SITE_OTHER): Payer: Medicare Other | Admitting: Neurology

## 2012-07-28 VITALS — BP 100/60 | HR 70 | Temp 97.8°F | Resp 12 | Ht 70.0 in | Wt 186.0 lb

## 2012-07-28 DIAGNOSIS — G40909 Epilepsy, unspecified, not intractable, without status epilepticus: Secondary | ICD-10-CM

## 2012-07-28 NOTE — Patient Instructions (Addendum)
Follow up in 4 months in our office.

## 2012-07-28 NOTE — Progress Notes (Signed)
Cristian Moss is a 67 year old male with a history of seizure disorder for the past 4 or 5 years.  His wife reports that he used to work from midnight until the early morning.  He came home after work and got into bed and then experienced a seizure.  He went stiff in his body and clenched his teeth and had some saliva at the mouth.  He did not bite his tongue and he did not have clonic jerking activity.  On that occasion, he did not lose continence of urine, but subsequently he has had some episodes where he did.  For the time his seizures were hard to control and she estimates that he may have had 30 seizures overall.  He had some while sitting in a chair and he fell onto the floor and he sometimes had him on the couch and he would go stiff but he was laying down he would not fall off the couch.  Initially he was given Dilantin and at one point he was put on Keppra. Is not clear to me if this was a switched to Keppra ORIF was added to the Dilantin. Most recently, he had a seizure in October which they state was his last seizure ( this complex slightly with a record from [facility] which may or may not be correct this as he had a seizure about a month before his last visit in February) and at that time his Dilantin was increased back to 3 pills at night and he was continuing on the Keppra 1500 mg twice a day.  He is not sure if he has any side effects but he wonders about the ringing in his ears.  He doesn't like taking that much medicine for the seizure disorder, but he is reluctantly willing to continue on his current regimen.  He also has restless leg syndrome is taking opening agonists for that. He has high blood pressure. 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  He sees Dr. Evlyn Kanner about every 6 months for medical checkups.  At this point he is not very active and leads a somewhat sedentary lifestyle. He smokes and he watches a  lot of TV he and his wife takes care of the cooking and cleaning.  As far the review of systems, he states that he feels pretty well, he sleeps well, has a good appetite, he doesn't complain of body pains or aches or digestive trouble. Review of systems is basically unremarkable other than the ringing in the ears and fatigue.  Past Medical History  Diagnosis Date  . Hypertension   . Seizures   . COPD (chronic obstructive pulmonary disease)   . Thrombocytopenia   . 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 BPH (benign prostatic hypertrophy)   . Erectile dysfunction   . Empty sella syndrome     History of  . Chronic kidney disease   . Hyponatremia     history of    Current Outpatient Prescriptions on File Prior to Visit  Medication Sig Dispense Refill  . bromocriptine (PARLODEL) 2.5 MG tablet Take 2.5 mg by mouth daily.      . clopidogrel (PLAVIX) 75 MG tablet Take 75 mg by mouth daily.      Marland Kitchen levETIRAcetam (KEPPRA) 750 MG tablet Take 1,500 mg by mouth every 12 (twelve) hours.      Marland Kitchen olmesartan (BENICAR) 20 MG tablet Take 20 mg by mouth  daily.      . phenytoin (DILANTIN) 100 MG ER capsule Take 300 mg by mouth at bedtime.        No current facility-administered medications on file prior to visit.   Review of patient's allergies indicates no known allergies.  History   Social History  . Marital Status: Married    Spouse Name: N/A    Number of Children: N/A  . Years of Education: N/A   Occupational History  . Not on file.   Social History Main Topics  . Smoking status: Current Every Day Smoker -- 1.00 packs/day for 47 years    Types: Cigarettes  . Smokeless tobacco: Never Used  . Alcohol Use: No  . Drug Use: No  . Sexually Active: No   Other Topics Concern  . Not on file   Social History Narrative  . No narrative on file   No family history on file.  BP 100/60  Pulse 70  Temp(Src) 97.8 F (36.6 C)  Resp 12  Ht 5\' 10"  (1.778 m)  Wt 186 lb (84.369 kg)  BMI 26.69 kg/m2    Alert and oriented x 3.  Memory function appears to be intact.  Concentration and attention are normal for educational level and background.  Speech is fluent and without significant word finding difficulty.  Is aware of current events.  No carotid bruits detected.  Cranial nerve II through XII are within normal limits.  This includes normal optic discs and acuity, EOMI, PERLA, facial movement and sensation intact, hearing grossly intact, gag intact,Uvula raises symmetrically and tongue protrudes evenly. Motor strength is 5 over 5 throughout all limbs.  No atrophy, abnormal tone or tremors. Reflexes are 2+ and symmetric in the upper and lower extremities Sensory exam is intact. Coordination is intact for fine movements and rapid alternating movements in all limbs Gait and station are normal.   Impression: 1. Generalized tonic seizures for the past 4-5 years with improved control on 2 drug regimen of Keppra 1500 b.i.d. Plus Dilantin generic 300 h.s..  Last recorded seizure about 6 months ago and they report that this is the only seizure he has had in the last 12 months. 2. Restless leg syndrome on medications.  Plan: As his seizure control as is good now as a time since the onset of seizures, I would recommend that they continue the current seizure drug regimen.  If he should develop increased side effects or increased seizures, then we can discuss changing his regimen. Return here in 4-6 months for followup.

## 2012-12-02 ENCOUNTER — Telehealth: Payer: Self-pay | Admitting: Neurology

## 2012-12-02 NOTE — Telephone Encounter (Signed)
I called the attorney, and I left a message. I have not seen this patient since August of 2011. The patient was seen more recently by the [facility] neurology group. If they still need to contact me, they are to call back.

## 2012-12-19 ENCOUNTER — Ambulatory Visit (INDEPENDENT_AMBULATORY_CARE_PROVIDER_SITE_OTHER): Payer: Medicare Other | Admitting: Neurology

## 2012-12-19 ENCOUNTER — Encounter: Payer: Self-pay | Admitting: Neurology

## 2012-12-19 VITALS — BP 126/76 | HR 80 | Temp 98.3°F | Ht 70.5 in | Wt 188.0 lb

## 2012-12-19 DIAGNOSIS — R569 Unspecified convulsions: Secondary | ICD-10-CM

## 2012-12-19 LAB — CBC WITH DIFFERENTIAL/PLATELET
Basophils Absolute: 0 10*3/uL (ref 0.0–0.1)
Lymphocytes Relative: 30.4 % (ref 12.0–46.0)
Monocytes Relative: 10.4 % (ref 3.0–12.0)
Platelets: 185 10*3/uL (ref 150.0–400.0)
RDW: 14.7 % — ABNORMAL HIGH (ref 11.5–14.6)

## 2012-12-19 LAB — COMPREHENSIVE METABOLIC PANEL
ALT: 22 U/L (ref 0–53)
CO2: 28 mEq/L (ref 19–32)
Chloride: 105 mEq/L (ref 96–112)
GFR: 77.23 mL/min (ref >60.00)
Sodium: 138 mEq/L (ref 135–145)
Total Bilirubin: 0.4 mg/dL (ref 0.3–1.2)
Total Protein: 7 g/dL (ref 6.0–8.3)

## 2012-12-19 NOTE — Progress Notes (Signed)
NEUROLOGY FOLLOW UP OFFICE NOTE  Lucion Dilger [id number]  HISTORY OF PRESENT ILLNESS: Cristian Moss is a 67 year old right-handed man with restless leg syndrome, hypertension, COPD, chronic kidney disease, and history of lead poisoning and thrombocytopenia who presents for follow up regarding seizure disorder.  He is accompanied by his wife.  He was previously followed by Dr. Murriel Hopper, who has since left [facility].  Records and images were personally reviewed where available.  Smitty Ackerley is a 67 year old right-handed man with restless leg syndrome, hypertension, COPD, chronic kidney disease, and history of lead poisoning and thrombocytopenia who presents for follow up regarding seizure disorder.  He is accompanied by his wife.  He was previously followed by Dr. Murriel Hopper, who has since left [facility].  Onset was 5 years ago.  His seizures are described as body stiffening, clenching of teeth, saliva at mouth and urinary incontinence.  They last up to a couple of minutes with a postictal period of lethargy for one day (he is responsive and alert).  There were no associated tongue biting or clonic activity.  They have been difficult to control and perhaps had about 30 seizures over the last 5 years.    He takes Dilantin and Keppra.  As per the chart, he has also taken Depakote and Topamax in the past.  His last seizure was last October 2013, when he was started on his current regimen.  His wife notes that at times, he will moan in his sleep and he seems restless.  However, he does not exhibit any posturing or urinary incontinence.  He saw Dr. Smiley Houseman in May, it was recommended to continue current regimen of Keppra 1500mg  BID and Dilantin ER 300mg  qhs, as his seizure control had been good.  02/11/12: phenytoin level 4.1, WBC 7.1, Hgb 11.4, Hct 33.9, MCV 90.4, PLT 165, Na 139, K 4.1, Cl 104, CO2 27, glucose 91, BUN 16, Cr 1.18, Ca 9.5 05/16/11 MRI Brain w/wo (limited due to motion  artifact) partially empty sella, global atrophy, small vessel disease 04/2011 EEG normal.  PAST MEDICAL HISTORY: Past Medical History  Diagnosis Date  . Hypertension   . Seizures   . COPD (chronic obstructive pulmonary disease)   . Thrombocytopenia   . 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 BPH (benign prostatic hypertrophy)   . Erectile dysfunction   . Empty sella syndrome     History of  . Chronic kidney disease   . Hyponatremia     history of    MEDICATIONS: Current Outpatient Prescriptions on File Prior to Visit  Medication Sig Dispense Refill  . Azilsartan Medoxomil (EDARBI) 40 MG TABS Take 40 mg by mouth daily.      . bromocriptine (PARLODEL) 2.5 MG tablet Take 2.5 mg by mouth daily.      . clopidogrel (PLAVIX) 75 MG tablet Take 75 mg by mouth daily.      Marland Kitchen guaiFENesin (MUCINEX) 600 MG 12 hr tablet Take 1,200 mg by mouth 2 (two) times daily.      Marland Kitchen levETIRAcetam (KEPPRA) 750 MG tablet Take 1,500 mg by mouth every 12 (twelve) hours.      Marland Kitchen olmesartan (BENICAR) 20 MG tablet Take 20 mg by mouth daily.      . phenytoin (DILANTIN) 100 MG ER capsule Take 300 mg by mouth at bedtime.        No current facility-administered medications on file prior to visit.    ALLERGIES: No Known Allergies  FAMILY  HISTORY: No family history on file.  SOCIAL HISTORY: History   Social History  . Marital Status: Married    Spouse Name: N/A    Number of Children: N/A  . Years of Education: N/A   Occupational History  . Not on file.   Social History Main Topics  . Smoking status: Current Every Day Smoker -- 1.00 packs/day for 47 years    Types: Cigarettes  . Smokeless tobacco: Never Used  . Alcohol Use: No  . Drug Use: No  . Sexual Activity: No   Other Topics Concern  . Not on file   Social History Narrative  . No narrative on file    PHYSICAL EXAM: Filed Vitals:   12/19/12 0917  BP: 126/76  Pulse: 80  Temp: 98.3 F (36.8 C)   General: No acute distress Head:   Normocephalic/atraumatic Neck: supple, no paraspinal tenderness, full range of motion Back: No paraspinal tenderness Neurological Exam: alert and oriented to person, place, and time, speech fluent and not dysarthric, language intact.  CN II-XII intact, bulk and tone normal, muscle strength 5/5 throughout, sensation to light touch, temperature and vibration intact, deep tendon reflexes 2+ throughout, toes downgoing, finger to nose and heel to shin testing intact, gait without ataxia, difficulty walking in tandem, Romberg negative.  IMPRESSION & PLAN: Seizure disorder.  Doing well.  The spells in his sleep do not clearly seem like seizure activity.  No changes to management at this time. 1.  Continue Dilantin ER 300mg  qhs and Keppra 1500mg  BID 2.  Check baseline phenytoin level, CBC and CMP 3.  Follow up in 6 months.  30 minutes spent with patient, over 50% spent counseling and coordinating care.  Shon Millet, DO  CC:  Adrian Prince, MD

## 2012-12-19 NOTE — Patient Instructions (Addendum)
1.  Continue Dilantin ER 300mg  at bedtime, as well as the Keppra 1500mg  twice daily 2.  We will check dilantin level, and other blood work 3.  Follow up in 6 months.

## 2013-01-09 ENCOUNTER — Other Ambulatory Visit: Payer: Self-pay | Admitting: Endocrinology

## 2013-01-09 DIAGNOSIS — E230 Hypopituitarism: Secondary | ICD-10-CM

## 2013-01-13 ENCOUNTER — Ambulatory Visit
Admission: RE | Admit: 2013-01-13 | Discharge: 2013-01-13 | Disposition: A | Discharge status: Home / Self Care | Payer: Medicare Other | Source: Ambulatory Visit | Attending: Endocrinology | Admitting: Endocrinology

## 2013-01-13 DIAGNOSIS — E049 Nontoxic goiter, unspecified: Secondary | ICD-10-CM

## 2013-01-13 DIAGNOSIS — E230 Hypopituitarism: Secondary | ICD-10-CM

## 2013-06-19 ENCOUNTER — Ambulatory Visit: Payer: Medicare Other | Admitting: Neurology

## 2013-06-23 ENCOUNTER — Encounter: Payer: Self-pay | Admitting: Neurology

## 2013-06-23 ENCOUNTER — Telehealth: Payer: Self-pay | Admitting: Neurology

## 2013-06-23 NOTE — Telephone Encounter (Signed)
Pt no showed 6 month follow up appt with Dr. Metta Clines 06/19/13. No show letter mailed to pt / Sherri S.

## 2013-12-22 ENCOUNTER — Ambulatory Visit (INDEPENDENT_AMBULATORY_CARE_PROVIDER_SITE_OTHER): Payer: Medicare HMO | Admitting: Neurology

## 2013-12-22 ENCOUNTER — Encounter: Payer: Self-pay | Admitting: Neurology

## 2013-12-22 VITALS — BP 118/70 | HR 100 | Resp 20 | Wt 166.4 lb

## 2013-12-22 DIAGNOSIS — G4089 Other seizures: Secondary | ICD-10-CM

## 2013-12-22 DIAGNOSIS — Z79899 Other long term (current) drug therapy: Secondary | ICD-10-CM

## 2013-12-22 DIAGNOSIS — R569 Unspecified convulsions: Secondary | ICD-10-CM

## 2013-12-22 NOTE — Progress Notes (Signed)
NEUROLOGY FOLLOW UP OFFICE NOTE  Cristian Moss [id number]  HISTORY OF PRESENT ILLNESS: Cristian Moss is a 68 year old right-handed man with restless leg syndrome, hypertension, COPD, chronic kidney disease, and history of lead poisoning and thrombocytopenia who presents for follow up regarding seizure disorder.  He is accompanied by his wife.  Records and images were personally reviewed where available.   UPDATE: 12/19/12:  WBC 7.4, HGB 12.1, HCT 35.4, PLT 185, Na 138, K 4.1, BUN 13, Cr 1.2, AP 93, TB 0.4, AST 28, ALT 22, Phenytoin level 20.5.  He had a seizure at the beginning of September.  That was his first seizure in 6 months.  He had 3 seizures over the weekend.  They were his typical seizures. He reports compliance with medication, but apparently he missed his morning dose of Keppra and ran out of the Dilantin two nights ago.  He denies any new medication or recent illness.  He has been under increased stress since having to adjust to his daughter moving back in the house.   HISTORY: Onset was 6 and a half years ago.  They started after he developed lead poisoning.  His seizures are described as body stiffening, clenching of teeth, saliva at mouth and urinary incontinence.  They last up to a couple of minutes with a postictal period of lethargy for one day (he is responsive and alert).  There were no associated tongue biting or clonic activity.  They have been difficult to control and perhaps had about 30 seizures over the last 5 years.    He takes Dilantin and Keppra.  As per the chart, he has also taken Depakote and Topamax in the past.  He has history of thrombocytopenia.  His wife notes that at times, he will moan in his sleep and he seems restless.  However, he does not exhibit any posturing or urinary incontinence.  He does not drive.  His current regimen of Keppra 1500mg  BID and Dilantin ER 300mg  qhs.  02/11/12: phenytoin level 4.1, WBC 7.1, Hgb 11.4, Hct 33.9, MCV 90.4, PLT 165,  Na 139, K 4.1, Cl 104, CO2 27, glucose 91, BUN 16, Cr 1.18, Ca 9.5 05/16/11 MRI Brain w/wo (limited due to motion artifact) partially empty sella, global atrophy, small vessel disease 04/2011 EEG normal.  PAST MEDICAL HISTORY: Past Medical History  Diagnosis Date  . Hypertension   . Seizures   . COPD (chronic obstructive pulmonary disease)   . Thrombocytopenia   . 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 BPH (benign prostatic hypertrophy)   . Erectile dysfunction   . Empty sella syndrome     History of  . Chronic kidney disease   . Hyponatremia     history of    MEDICATIONS: Current Outpatient Prescriptions on File Prior to Visit  Medication Sig Dispense Refill  . Azilsartan Medoxomil (EDARBI) 40 MG TABS Take 40 mg by mouth daily.      . bromocriptine (PARLODEL) 2.5 MG tablet Take 2.5 mg by mouth daily.      . clopidogrel (PLAVIX) 75 MG tablet Take 75 mg by mouth daily.      Marland Kitchen guaiFENesin (MUCINEX) 600 MG 12 hr tablet Take 1,200 mg by mouth 2 (two) times daily.      Marland Kitchen levETIRAcetam (KEPPRA) 750 MG tablet Take 1,500 mg by mouth every 12 (twelve) hours.      Marland Kitchen olmesartan (BENICAR) 20 MG tablet Take 20 mg by mouth daily.      . phenytoin (  DILANTIN) 100 MG ER capsule Take 300 mg by mouth at bedtime.        No current facility-administered medications on file prior to visit.    ALLERGIES: No Known Allergies  FAMILY HISTORY: No family history on file.  SOCIAL HISTORY: History   Social History  . Marital Status: Married    Spouse Name: N/A    Number of Children: N/A  . Years of Education: N/A   Occupational History  . Not on file.   Social History Main Topics  . Smoking status: Current Every Day Smoker -- 1.00 packs/day for 47 years    Types: Cigarettes  . Smokeless tobacco: Never Used  . Alcohol Use: No  . Drug Use: No  . Sexual Activity: No   Other Topics Concern  . Not on file   Social History Narrative  . No narrative on file    REVIEW OF SYSTEMS: Constitutional:  No fevers, chills, or sweats, no generalized fatigue, change in appetite Eyes: No visual changes, double vision, eye pain Ear, nose and throat: No hearing loss, ear pain, nasal congestion, sore throat Cardiovascular: No chest pain, palpitations Respiratory:  No shortness of breath at rest or with exertion, wheezes GastrointestinaI: No nausea, vomiting, diarrhea, abdominal pain, fecal incontinence Genitourinary:  No dysuria, urinary retention or frequency Musculoskeletal:  No neck pain, back pain Integumentary: No rash, pruritus, skin lesions Neurological: as above Psychiatric: No depression, insomnia, anxiety Endocrine: No palpitations, fatigue, diaphoresis, mood swings, change in appetite, change in weight, increased thirst Hematologic/Lymphatic:  No anemia, purpura, petechiae. Allergic/Immunologic: no itchy/runny eyes, nasal congestion, recent allergic reactions, rashes  PHYSICAL EXAM: Filed Vitals:   12/22/13 1329  BP: 118/70  Pulse: 100  Resp: 20   General: No acute distress Head:  Normocephalic/atraumatic Neck: supple, no paraspinal tenderness, full range of motion Heart:  Regular rate and rhythm Lungs:  Clear to auscultation bilaterally Back: No paraspinal tenderness Neurological Exam: alert and oriented to person, place, and time. Attention span and concentration intact, recent and remote memory intact, fund of knowledge intact.  Speech fluent and not dysarthric, language intact.  CN II-XII intact, bulk and tone normal, muscle strength 5/5 throughout, sensation to light touch, temperature and vibration intact, deep tendon reflexes 2+ throughout, toes downgoing, finger to nose and heel to shin testing intact, gait without ataxia, difficulty walking in tandem, Romberg negative.  IMPRESSION: Generalized tonic seizures.  I do question compliance.  PLAN: 1.  We will restart him on Keppra 1500mg  twice daily and phenytoin ER 300mg  daily.  He will take in as directed for 1 week and  then we will check trough levels of both.  Medication adjustment will be made accordingly. 2.  Follow up in 3 months  Metta Clines, DO  CC:  Cristian Bowen, MD

## 2013-12-22 NOTE — Patient Instructions (Signed)
1.  Start the phenytoin 300mg  at bedtime and levetiracetam 1500mg  twice daily for one week.  At that time, we will check blood levels for the medications.  We will adjust the medications as needed. 2.  Follow up in 3 months.

## 2013-12-29 LAB — PHENYTOIN LEVEL, TOTAL: PHENYTOIN LVL: 12.6 ug/mL (ref 10.0–20.0)

## 2014-01-01 ENCOUNTER — Telehealth: Payer: Self-pay | Admitting: Neurology

## 2014-01-01 LAB — LEVETIRACETAM LEVEL: KEPPRA (LEVETIRACETAM): 82.8 ug/mL

## 2014-01-01 MED ORDER — PHENYTOIN SODIUM EXTENDED 100 MG PO CAPS: 400.0000 mg | ORAL_CAPSULE | Freq: Every day | ORAL | Status: DC

## 2014-01-01 NOTE — Telephone Encounter (Signed)
Patient made aware to increase Dilantin to 400 mg. Refill sent to pharmacy to reflect new dosage.

## 2014-01-01 NOTE — Telephone Encounter (Signed)
Message copied by Annamaria Helling on Fri Jan 01, 2014  9:56 AM ------      Message from: JAFFE, ADAM R      Created: Fri Jan 01, 2014  7:36 AM       Based on medication levels, I would increase the Dilantin ER to 400mg  at bedtime.      ----- Message -----         From: [facility] Interface         Sent: 01/01/2014   7:14 AM           To: Dudley Major, DO                   ------

## 2014-01-06 ENCOUNTER — Telehealth: Payer: Self-pay | Admitting: Neurology

## 2014-01-06 NOTE — Telephone Encounter (Signed)
request for records recv'd by fax from M.Denman George, Brooke Bonito. Attorney at [employer]. Request forwarded to HIM @ [facility] via courier for processing / Sherri

## 2014-01-12 ENCOUNTER — Emergency Department (HOSPITAL_COMMUNITY)
Admission: EM | Admit: 2014-01-12 | Discharge: 2014-01-12 | Disposition: A | Discharge status: Home / Self Care | Payer: Medicare HMO | Attending: Emergency Medicine | Admitting: Emergency Medicine

## 2014-01-12 ENCOUNTER — Emergency Department (HOSPITAL_COMMUNITY): Payer: Medicare HMO

## 2014-01-12 ENCOUNTER — Encounter (HOSPITAL_COMMUNITY): Payer: Self-pay | Admitting: Emergency Medicine

## 2014-01-12 DIAGNOSIS — R42 Dizziness and giddiness: Secondary | ICD-10-CM | POA: Insufficient documentation

## 2014-01-12 DIAGNOSIS — Z8673 Personal history of transient ischemic attack (TIA), and cerebral infarction without residual deficits: Secondary | ICD-10-CM | POA: Insufficient documentation

## 2014-01-12 DIAGNOSIS — J449 Chronic obstructive pulmonary disease, unspecified: Secondary | ICD-10-CM | POA: Insufficient documentation

## 2014-01-12 DIAGNOSIS — Z79899 Other long term (current) drug therapy: Secondary | ICD-10-CM | POA: Insufficient documentation

## 2014-01-12 DIAGNOSIS — T420X5A Adverse effect of hydantoin derivatives, initial encounter: Secondary | ICD-10-CM

## 2014-01-12 DIAGNOSIS — G40909 Epilepsy, unspecified, not intractable, without status epilepticus: Secondary | ICD-10-CM | POA: Diagnosis not present

## 2014-01-12 DIAGNOSIS — I129 Hypertensive chronic kidney disease with stage 1 through stage 4 chronic kidney disease, or unspecified chronic kidney disease: Secondary | ICD-10-CM | POA: Diagnosis not present

## 2014-01-12 DIAGNOSIS — Z862 Personal history of diseases of the blood and blood-forming organs and certain disorders involving the immune mechanism: Secondary | ICD-10-CM | POA: Diagnosis not present

## 2014-01-12 DIAGNOSIS — N189 Chronic kidney disease, unspecified: Secondary | ICD-10-CM | POA: Insufficient documentation

## 2014-01-12 DIAGNOSIS — Z8639 Personal history of other endocrine, nutritional and metabolic disease: Secondary | ICD-10-CM | POA: Diagnosis not present

## 2014-01-12 DIAGNOSIS — Z720 Tobacco use: Secondary | ICD-10-CM | POA: Diagnosis not present

## 2014-01-12 DIAGNOSIS — Z87438 Personal history of other diseases of male genital organs: Secondary | ICD-10-CM | POA: Insufficient documentation

## 2014-01-12 LAB — URINALYSIS, ROUTINE W REFLEX MICROSCOPIC
Bilirubin Urine: NEGATIVE
Glucose, UA: NEGATIVE mg/dL
Hgb urine dipstick: NEGATIVE
KETONES UR: NEGATIVE mg/dL
LEUKOCYTES UA: NEGATIVE
NITRITE: NEGATIVE
PROTEIN: NEGATIVE mg/dL
Specific Gravity, Urine: 1.019 (ref 1.005–1.030)
UROBILINOGEN UA: 0.2 mg/dL (ref 0.0–1.0)
pH: 5.5 (ref 5.0–8.0)

## 2014-01-12 LAB — CBC
HCT: 36.5 % — ABNORMAL LOW (ref 39.0–52.0)
Hemoglobin: 12.2 g/dL — ABNORMAL LOW (ref 13.0–17.0)
MCH: 30.4 pg (ref 26.0–34.0)
MCHC: 33.4 g/dL (ref 30.0–36.0)
MCV: 91 fL (ref 78.0–100.0)
PLATELETS: 190 10*3/uL (ref 150–400)
RBC: 4.01 MIL/uL — AB (ref 4.22–5.81)
RDW: 13.8 % (ref 11.5–15.5)
WBC: 5.3 10*3/uL (ref 4.0–10.5)

## 2014-01-12 LAB — BASIC METABOLIC PANEL
ANION GAP: 10 (ref 5–15)
BUN: 11 mg/dL (ref 6–23)
CALCIUM: 9.1 mg/dL (ref 8.4–10.5)
CO2: 25 mEq/L (ref 19–32)
Chloride: 100 mEq/L (ref 96–112)
Creatinine, Ser: 0.98 mg/dL (ref 0.50–1.35)
GFR calc Af Amer: >90 mL/min (ref >90)
GFR, EST NON AFRICAN AMERICAN: 82 mL/min — AB (ref >90)
Glucose, Bld: 81 mg/dL (ref 70–99)
POTASSIUM: 4.3 meq/L (ref 3.7–5.3)
SODIUM: 135 meq/L — AB (ref 137–147)

## 2014-01-12 LAB — RAPID URINE DRUG SCREEN, HOSP PERFORMED
AMPHETAMINES: NOT DETECTED
BARBITURATES: NOT DETECTED
BENZODIAZEPINES: NOT DETECTED
Cocaine: NOT DETECTED
Opiates: NOT DETECTED
TETRAHYDROCANNABINOL: POSITIVE — AB

## 2014-01-12 LAB — PROTIME-INR
INR: 1.17 (ref 0.00–1.49)
Prothrombin Time: 15 seconds (ref 11.6–15.2)

## 2014-01-12 LAB — I-STAT TROPONIN, ED: TROPONIN I, POC: 0.01 ng/mL (ref 0.00–0.08)

## 2014-01-12 LAB — APTT: aPTT: 34 seconds (ref 24–37)

## 2014-01-12 LAB — PHENYTOIN LEVEL, TOTAL: Phenytoin Lvl: 27.3 ug/mL — ABNORMAL HIGH (ref 10.0–20.0)

## 2014-01-12 MED ORDER — LORAZEPAM 2 MG/ML IJ SOLN
2.0000 mg | Freq: Once | INTRAMUSCULAR | Status: AC
  Administered 2014-01-12: 2 mg via INTRAVENOUS
  Filled 2014-01-12: qty 1

## 2014-01-12 NOTE — Discharge Instructions (Signed)
Your symptoms are likely due to your Dilantin level being too high. Skip your Dilantin today. Please get your Dilantin level checked tomorrow morning at your primary care physician office. Discussed with your primary care physician possibly reducing the dose of your Dilantin. Please return to the emergency department with new or worsening symptoms.  Dizziness  Dizziness means you feel unsteady or lightheaded. You might feel like you are going to pass out (faint). HOME CARE   Drink enough fluids to keep your pee (urine) clear or pale yellow.  Take your medicines exactly as told by your doctor. If you take blood pressure medicine, always stand up slowly from the lying or sitting position. Hold on to something to steady yourself.  If you need to stand in one place for a long time, move your legs often. Tighten and relax your leg muscles.  Have someone stay with you until you feel okay.  Do not drive or use heavy machinery if you feel dizzy.  Do not drink alcohol. GET HELP RIGHT AWAY IF:   You feel dizzy or lightheaded and it gets worse.  You feel sick to your stomach (nauseous), or you throw up (vomit).  You have trouble talking or walking.  You feel weak or have trouble using your arms, hands, or legs.  You cannot think clearly or have trouble forming sentences.  You have chest pain, belly (abdominal) pain, sweating, or you are short of breath.  Your vision changes.  You are bleeding.  You have problems from your medicine that seem to be getting worse. MAKE SURE YOU:   Understand these instructions.  Will watch your condition.  Will get help right away if you are not doing well or get worse. Document Released: 03/01/2011 Document Revised: 06/04/2011 Document Reviewed: 03/01/2011 [facility] Patient Information 2015 [place], [place]. This information is not intended to replace advice given to you by your health care provider. Make sure you discuss any questions you have with your  health care provider.  Phenytoin Toxicity Phenytoin is a medicine that is used to help control many seizure problems. Phenytoin toxicity means the amount of phenytoin in the blood is high enough to cause problems. Your caregiver will carefully and frequently monitor the amount of phenytoin in the blood to make sure that just the right dose is given. How you feel and your blood test information help guide your caregiver in making sure that a specific dose is best for you.  CAUSES  Changes in other prescription medicines can directly influence the amount of phenytoin in the blood, even though the amount of phenytoin taken remains unchanged. The following medicines may raise the level of phenytoin in the blood:   Amiodarone.  Chlordiazepoxide.  Diazepam.  Dicumarol (blood thinner).  Disulfiram.  Estrogens (hormone replacement).  Aspirin and medicines containing salicylates.  Sulfonamides.  Tolbutamide (used for diabetes management).  Famotidine (used for ulcer disease).  Isoniazid (antibiotic).  Methylphenidate (used for attention deficit disorders).  Phenothiazines (used for nausea).  Trazodone (used for depression and sleep problems).  Primidone.  Fluconazole (used for yeast infections).  Valproic acid (used for seizures). Patients taking many medicines for a variety of ailments may accidentally take too much phenytoin. A change in metabolism or heavy, binge drinking of alcohol can also raise the level of phenytoin. In rare cases, problems develop when phenytoin is given directly into a vein, and it is given too fast. Emergency department and hospital staff giving phenytoin this way avoid this problem by the use  of intravenous (IV) pumps and careful monitoring. SYMPTOMS  In mild cases, symptoms may not be pronounced. They may develop slowly over weeks or months. They can include:   Poor balance or weakness when walking.  Slow movement with all activity.  Tremors or  shaking.  Irritability.  Confusion.  Loss of control of urine.  Trouble speaking or swallowing.  Double vision.  Tender and swollen gums.  Unusual pains in the arms or legs.  Abnormal hair growth.  Nausea or vomiting.  Problems with functioning of the thyroid gland. In severe cases there may be:  Disorientation.  Severe confusion.  Coma.  Uncontrolled movement of arms or legs.  Trouble breathing.  Yellowing of the skin or eyes (jaundice).  Skin rashes.  Swelling of the face.  Abdominal pain with possible enlargement of the liver or spleen. In some cases, too much phenytoin is taken intentionally in a suicide attempt. This can be very serious and requires emergency care and hospitalization. DIAGNOSIS  Your caregiver will ask for several blood tests, such as:  A blood count.  Kidney or liver tests.  Levels of glucose (sugar) and normal salts in the blood.  Phenytoin and other medicine levels.  Pregnancy test, when appropriate. Other tests may also be needed such as:  Urine test (urinalysis).  A test that records the electrical activity of the heart (electrocardiography, ECG).  X-rays. If you are experiencing confusion or disorientation, a computerized X-ray scan (CT or CAT scan) of the head may be needed.  TREATMENT  In mild cases, the phenytoin is stopped for a period of time as directed by your caregiver. The time period might be as short as only 1 or 2 days and will be followed by careful checking of symptoms and repeat blood tests. A review of all medicines (prescription, nonprescription, and herbal) should be done at this time. In more severe cases, hospitalization is mandatory and may include monitoring in an intensive care unit. If there is severe disorientation or coma, or if problems are found in the liver or heart, help is obtained from medical specialists. HOME CARE INSTRUCTIONS   Be careful when taking your daily medicines. It is very  important to ask for help if you need it. The same problems can develop again if medicine dosages change.  Follow through with blood tests and office visits as directed by your caregiver. SEEK MEDICAL CARE IF:   You are confused about how to take your medicines.  You plan to adjust dosages of any of your prescription or nonprescription medicines. This may affect the level of phenytoin in your blood.  You develop any of the "mild case" symptoms described above.  You have episodes of falling or have problems keeping your balance. SEEK IMMEDIATE MEDICAL CARE IF:   You fall and are injured.  You develop any of the "severe case" symptoms described above. MAKE SURE YOU:   Understand these instructions.  Will watch your condition.  Will get help right away if you are not doing well or get worse. Document Released: 07/12/2006 Document Revised: 07/27/2013 Document Reviewed: 02/28/2007 [facility] Patient Information 2015 [place], [place]. This information is not intended to replace advice given to you by your health care provider. Make sure you discuss any questions you have with your health care provider.

## 2014-01-12 NOTE — ED Notes (Signed)
Pt returned from MRI at present time.

## 2014-01-12 NOTE — ED Notes (Addendum)
Pt c/o dizziness w/ ambulation and unsteady gait x 2 days.  Denies pain and weakness.  Pt's wife reports Hx of seizures and TIAs.

## 2014-01-12 NOTE — ED Provider Notes (Signed)
CSN: [id number]     Arrival date & time 01/12/14  1303 History   First MD Initiated Contact with Patient 01/12/14 1457     Chief Complaint  Patient presents with  . Dizziness  . Gait Problem     (Consider location/radiation/quality/duration/timing/severity/associated sxs/prior Treatment) HPI Comments: Patient is a 68 year old male with history of hypertension, seizures, COPD, thrombocytopenia, BPH, erectile dysfunction, empty sella syndrome, chronic kidney disease, hyponatremia who presents to the ED today for evaluation of dizziness. He reports that he has been intermittently dizzy while he is walking around for the past 3 days. He reports that he can sit down and feel better. Today while he was in [place] he needed to hold on to his wife so he would not fall. He has history of TIA and seizure. He takes Keppra and Dilantin and has been compliant with his meds. He denies numbness, weakness, speech difficulty, chest pain, shortness of breath.   Patient is a 68 y.o. male presenting with dizziness. The history is provided by the patient. No language interpreter was used.  Dizziness Associated symptoms: no chest pain, no nausea, no shortness of breath and no vomiting     Past Medical History  Diagnosis Date  . Hypertension   . Seizures   . COPD (chronic obstructive pulmonary disease)   . Thrombocytopenia   . 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 BPH (benign prostatic hypertrophy)   . Erectile dysfunction   . Empty sella syndrome     History of  . Chronic kidney disease   . Hyponatremia     history of   Past Surgical History  Procedure Laterality Date  . 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 No pertinent family history. History  Substance Use Topics  . Smoking status: Current Every Day Smoker -- 1.00 packs/day for 47 years    Types: Cigarettes  . Smokeless tobacco: Never Used  . Alcohol Use: No    Review of Systems  Constitutional: Negative for fever and chills.   Respiratory: Negative for shortness of breath.   Cardiovascular: Negative for chest pain.  Gastrointestinal: Negative for nausea, vomiting and abdominal pain.  Musculoskeletal: Positive for gait problem.  Neurological: Positive for dizziness.  All other systems reviewed and are negative.     Allergies  Review of patient's allergies indicates no known allergies.  Home Medications   Prior to Admission medications   Medication Sig Start Date End Date Taking? Authorizing Provider  bromocriptine (PARLODEL) 2.5 MG tablet Take 2.5 mg by mouth 2 (two) times daily.    Yes Historical Provider, MD  levETIRAcetam (KEPPRA) 750 MG tablet Take 1,500 mg by mouth every 12 (twelve) hours.   Yes Historical Provider, MD  olmesartan (BENICAR) 20 MG tablet Take 10 mg by mouth every other day.    Yes Historical Provider, MD  phenytoin (DILANTIN) 100 MG ER capsule Take 4 capsules (400 mg total) by mouth at bedtime. 01/01/14  Yes Adam Melvern Sample, DO   BP 177/89  Pulse 66  Temp(Src) 97.6 F (36.4 C) (Oral)  Resp 16  SpO2 97% Physical Exam  Nursing note and vitals reviewed. Constitutional: He is oriented to person, place, and time. He appears well-developed and well-nourished. No distress.  HENT:  Head: Normocephalic and atraumatic.  Right Ear: Tympanic membrane, external ear and ear canal normal.  Left Ear: Tympanic membrane, external ear and ear canal normal.  Nose: Nose normal.  No facial droop  Eyes: Conjunctivae and  EOM are normal. Pupils are equal, round, and reactive to light.  Neck: Normal range of motion. No spinous process tenderness and no muscular tenderness present. No tracheal deviation present.  Cardiovascular: Normal rate, regular rhythm, normal heart sounds, intact distal pulses and normal pulses.   Pulses:      Radial pulses are 2+ on the right side, and 2+ on the left side.       Posterior tibial pulses are 2+ on the right side, and 2+ on the left side.  Pulmonary/Chest: Effort  normal and breath sounds normal. No stridor.  Abdominal: Soft. He exhibits no distension. There is no tenderness.  Musculoskeletal: Normal range of motion.  Neurological: He is alert and oriented to person, place, and time. He has normal strength. No sensory deficit. Gait abnormal. GCS eye subscore is 4. GCS verbal subscore is 5. GCS motor subscore is 6.  Ataxic gait Finger nose finger normal, rapid alternating movements normal, heel knee shin normal Strength 5/5 in all extremities  Skin: Skin is warm and dry. He is not diaphoretic.  Psychiatric: He has a normal mood and affect. His behavior is normal.    ED Course  Procedures (including critical care time) Labs Review Labs Reviewed  CBC - Abnormal; Notable for the following:    RBC 4.01 (*)    Hemoglobin 12.2 (*)    HCT 36.5 (*)    All other components within normal limits  BASIC METABOLIC PANEL - Abnormal; Notable for the following:    Sodium 135 (*)    GFR calc non Af Amer 82 (*)    All other components within normal limits  URINE RAPID DRUG SCREEN (HOSP PERFORMED) - Abnormal; Notable for the following:    Tetrahydrocannabinol POSITIVE (*)    All other components within normal limits  URINALYSIS, ROUTINE W REFLEX MICROSCOPIC - Abnormal; Notable for the following:    APPearance CLOUDY (*)    All other components within normal limits  PHENYTOIN LEVEL, TOTAL - Abnormal; Notable for the following:    Phenytoin Lvl 27.3 (*)    All other components within normal limits  PROTIME-INR  APTT  I-STAT TROPOININ, ED    Imaging Review Mr Brain Wo Contrast  01/12/2014   CLINICAL DATA:  Dizziness with ambulation and unsteady gait for 2 days. History of seizures, altered mental status, and lead poisoning.  EXAM: MRI HEAD WITHOUT CONTRAST  TECHNIQUE: Multiplanar, multiecho pulse sequences of the brain and surrounding structures were obtained without intravenous contrast.  COMPARISON:  05/16/2011  FINDINGS: Images are mildly to moderately  degraded by motion artifact.  Incidental note is again made of a partially empty sella, unchanged. There is no evidence of acute infarct, mass, midline shift, or extra-axial fluid collection. Old hemorrhage is again noted in the right basal ganglia. Dedicated thin section coronal oblique images through the temporal lobes are moderately to severely degraded by motion artifact, precluding detailed evaluation of the hippocampi. Patchy and confluent T2 hyperintensities in the subcortical and deep cerebral white matter have mildly progressed from the prior study and are nonspecific but compatible with moderate chronic small vessel ischemic disease. There is moderate cerebral atrophy, unchanged. Mildly prominent CSF space in the anterior right middle cranial fossa is unchanged. 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  Orbits are unremarkable. Left maxillary sinus is hypoplastic largely opacified, unchanged. Mastoid air cells are clear. Right vertebral artery flow void is again not well seen. Other major intracranial vascular flow voids are  grossly preserved.  IMPRESSION: 1. No evidence of acute intracranial abnormality or mass. 2. Moderate chronic small vessel ischemic disease, mildly progressed from prior.   Electronically Signed   By: Logan Bores   On: 01/12/2014 17:28     EKG Interpretation   Date/Time:  Tuesday January 12 2014 13:21:01 EDT Ventricular Rate:  69 PR Interval:  216 QRS Duration: 120 QT Interval:  418 QTC Calculation: 447 R Axis:   -64 Text Interpretation:  Sinus rhythm with 1st degree A-V block Left axis  deviation Septal infarct , age undetermined Abnormal ECG No significant  change since 2013 Confirmed by GOLDSTON  MD, SCOTT ([id number]) on 01/12/2014  3:52:50 PM      MDM   Final diagnoses:  Dizziness  Phenytoin adverse reaction, initial encounter   6:30 PM Discussed case with Dr. Aram Beecham who recommends skipping Dilantin today and following up with  PCP for recheck of Dilantin level tomorrow morning. Restart tomorrow, likely needing lower dose.   Patient presents to ED for evaluation of dizziness and ataxic gait. Initial concern for stroke. MRI brain shows no acute intracranial abnormality or mass. Phenytoin level elevated. Patient did recently increase his Dilantin from 300 mg to 400 mg at bedtime. Discussed case with Dr. Aram Beecham who reports patient's sx could be attributed to this supra therapeutic dilantin level. Explained skipping Dilantin tonight and f/u with Dr. Forde Dandy. Patient and family voice understanding. Discussed reasons to return to ED immediately. Vital signs stable for discharge. Dr. Regenia Skeeter evaluated patient and agrees with plan. Patient / Family / Caregiver informed of clinical course, understand medical decision-making process, and agree with plan.   Elwyn Lade, PA-C 01/13/14 1224

## 2014-01-12 NOTE — ED Notes (Signed)
Pt reports unsteady on feet. Pt denies CP, SOB, dizziness, or pain. Pt hx of TIA and seizures. Pt reports takes all medication as prescribed.

## 2014-01-13 ENCOUNTER — Telehealth: Payer: Self-pay | Admitting: Neurology

## 2014-01-13 ENCOUNTER — Telehealth: Payer: Self-pay | Admitting: *Deleted

## 2014-01-13 ENCOUNTER — Other Ambulatory Visit: Payer: Self-pay | Admitting: *Deleted

## 2014-01-13 NOTE — Telephone Encounter (Signed)
Hold dose for 2 days, then Lets do Dilantin 300mg  with additional 30mg  at bedtime then. Recheck level on Monday. Thanks!

## 2014-01-13 NOTE — Telephone Encounter (Signed)
30 mg dilantin ER # 30 1 po with 300 mg qd until levels are in normal range patient's wife is aware

## 2014-01-13 NOTE — Telephone Encounter (Signed)
Mardene Celeste, pt's spouse called requesting to speak to a nurse regarding pt's meds. She states that pt went to the hospital and  Did blood work and it showed that pt's meds dosage was too high. Please call Mardene Celeste C/B [phone]

## 2014-01-13 NOTE — Telephone Encounter (Signed)
Pharmacy does not have a 50 mg Dilantin at all they have a 30 ER they no longer make 50 ER please advise

## 2014-01-13 NOTE — Telephone Encounter (Signed)
Pt wife called and really wants to speak with someone please call her at home patricia is his wifes name

## 2014-01-14 NOTE — ED Provider Notes (Signed)
Medical screening examination/treatment/procedure(s) were conducted as a shared visit with non-physician practitioner(s) and myself.  I personally evaluated the patient during the encounter.   EKG Interpretation   Date/Time:  Tuesday January 12 2014 13:21:01 EDT Ventricular Rate:  69 PR Interval:  216 QRS Duration: 120 QT Interval:  418 QTC Calculation: 447 R Axis:   -64 Text Interpretation:  Sinus rhythm with 1st degree A-V block Left axis  deviation Septal infarct , age undetermined Abnormal ECG No significant  change since 2013 Confirmed by Solmon Bohr  MD, Kadelyn Dimascio ([id number]) on 01/12/2014  3:52:50 PM       Patient with ataxia. No AMS. MRI negative. Dilantin moderately elevated, could be causing his symptoms. Neuro recommends med changes and will d/c home  Ephraim Hamburger, MD 01/14/14 1505

## 2014-03-15 ENCOUNTER — Ambulatory Visit: Payer: Medicare HMO | Admitting: Neurology

## 2014-03-15 ENCOUNTER — Telehealth: Payer: Self-pay | Admitting: Neurology

## 2014-03-15 NOTE — Telephone Encounter (Signed)
Pt resch from 04-20-14 to 03-31-14

## 2014-03-16 NOTE — Telephone Encounter (Signed)
Pt called the day of his 03/15/14 appt to r/s as he did not have a ride. Appt has been r/s but 03/15/14 appt is considered a no show since the patient called the day of. A no show letter does not need to be sent / Sherri S.

## 2014-03-23 ENCOUNTER — Ambulatory Visit: Payer: Medicare HMO | Admitting: Neurology

## 2014-03-31 ENCOUNTER — Ambulatory Visit (INDEPENDENT_AMBULATORY_CARE_PROVIDER_SITE_OTHER): Payer: PPO | Admitting: Neurology

## 2014-03-31 ENCOUNTER — Encounter: Payer: Self-pay | Admitting: Neurology

## 2014-03-31 ENCOUNTER — Ambulatory Visit: Payer: Medicare HMO | Admitting: Neurology

## 2014-03-31 VITALS — BP 132/80 | HR 92 | Ht 70.0 in | Wt 175.0 lb

## 2014-03-31 DIAGNOSIS — G40409 Other generalized epilepsy and epileptic syndromes, not intractable, without status epilepticus: Secondary | ICD-10-CM

## 2014-03-31 DIAGNOSIS — Z5181 Encounter for therapeutic drug level monitoring: Secondary | ICD-10-CM

## 2014-03-31 MED ORDER — LACOSAMIDE 50 MG PO TABS: 50.0000 mg | ORAL_TABLET | Freq: Two times a day (BID) | ORAL | Status: DC

## 2014-03-31 NOTE — Patient Instructions (Signed)
1.  Continue the phenytoin ER 300mg  at bedtime and levetiracetam 1500mg  twice daily. 2.  We will start you on a third medication called Vimpat.  We will give you a starter back.  Take as directed.  Take 50mg  twice daily for 7 days, then 100mg  twice daily.  Call next week and we can send in a refill. 3.  We will check phenytoin level 4.  Take calcium 600mg  twice daily and vitamin D 800 units daily 5.  No driving 6.  Follow up in 3 months.

## 2014-03-31 NOTE — Progress Notes (Addendum)
NEUROLOGY FOLLOW UP OFFICE NOTE  Cristian Moss [id number]  HISTORY OF PRESENT ILLNESS: Cristian Moss is a 69 year old right-handed man with restless leg syndrome, hypertension, COPD, chronic kidney disease, and history of lead poisoning and thrombocytopenia who presents for follow up regarding seizure disorder.  He is accompanied by his wife who provides some history.  ED records and MRI of brain were personally reviewed.   UPDATE: He had a seizure in September.  Phenytoin level was 12, so Dilantin ER was increased to 400mg .  He developed dizziness and went to the ED on 01/12/14.  MRI of the brain revealed no acute abnormality or mass.  Phenytoin level was 27.3.  He held the Dilantin for 2 days and then decreased the dose to 330mg  at bedtime, however he reports that he has been taking only 300mg  at bedtime.  He continues to take Keppra 1500mg  twice daily as well.    He reports that he had a seizure last month.  He woke up having wet the bed.  He said that his wife said he had a seizure in his sleep.  01/12/14 WBC 5.3, HGB 12.2, HCT 36.5, PLT 190, Na 135, K 4.3, BUN 11, Cr 0.98.  Urine drug screen was positive for THC.  HISTORY: Onset was 6 and a half years ago.  They started after he developed lead poisoning.  His seizures are described as body stiffening, clenching of teeth, saliva at mouth and urinary incontinence.  They last up to a couple of minutes with a postictal period of lethargy for one day (he is responsive and alert).  There were no associated tongue biting or clonic activity.  They have been difficult to control and perhaps had about 30 seizures over the last 5 years.    He takes Dilantin and Keppra.  As per the chart, he has also taken Depakote and Topamax in the past.  He has history of thrombocytopenia.  His wife notes that at times, he will moan in his sleep and he seems restless.  However, he does not exhibit any posturing or urinary incontinence.  He does not drive.  05/16/11  MRI Brain w/wo (limited due to motion artifact) partially empty sella, global atrophy, small vessel disease 04/2011 EEG normal.  PAST MEDICAL HISTORY: Past Medical History  Diagnosis Date  . Hypertension   . Seizures   . COPD (chronic obstructive pulmonary disease)   . Thrombocytopenia   . 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 BPH (benign prostatic hypertrophy)   . Erectile dysfunction   . Empty sella syndrome     History of  . Chronic kidney disease   . Hyponatremia     history of    MEDICATIONS: Current Outpatient Prescriptions on File Prior to Visit  Medication Sig Dispense Refill  . bromocriptine (PARLODEL) 2.5 MG tablet Take 2.5 mg by mouth 2 (two) times daily.     Marland Kitchen levETIRAcetam (KEPPRA) 750 MG tablet Take 1,500 mg by mouth every 12 (twelve) hours.    Marland Kitchen olmesartan (BENICAR) 20 MG tablet Take 10 mg by mouth every other day.     . phenytoin (DILANTIN) 100 MG ER capsule Take 4 capsules (400 mg total) by mouth at bedtime. 120 capsule 2   No current facility-administered medications on file prior to visit.    ALLERGIES: No Known Allergies  FAMILY HISTORY: No family history on file.  SOCIAL HISTORY: History   Social History  . Marital Status: Married    Spouse Name: N/A  Number of Children: N/A  . Years of Education: N/A   Occupational History  . Not on file.   Social History Main Topics  . Smoking status: Current Every Day Smoker -- 1.00 packs/day for 47 years    Types: Cigarettes  . Smokeless tobacco: Never Used  . Alcohol Use: No  . Drug Use: No  . Sexual Activity: No   Other Topics Concern  . Not on file   Social History Narrative    REVIEW OF SYSTEMS: Constitutional: No fevers, chills, or sweats, no generalized fatigue, change in appetite Eyes: No visual changes, double vision, eye pain Ear, nose and throat: No hearing loss, ear pain, nasal congestion, sore throat Cardiovascular: No chest pain, palpitations Respiratory:  No shortness of breath at rest  or with exertion, wheezes GastrointestinaI: No nausea, vomiting, diarrhea, abdominal pain, fecal incontinence Genitourinary:  No dysuria, urinary retention or frequency Musculoskeletal:  No neck pain, back pain Integumentary: No rash, pruritus, skin lesions Neurological: as above Psychiatric: No depression, insomnia, anxiety Endocrine: No palpitations, fatigue, diaphoresis, mood swings, change in appetite, change in weight, increased thirst Hematologic/Lymphatic:  No anemia, purpura, petechiae. Allergic/Immunologic: no itchy/runny eyes, nasal congestion, recent allergic reactions, rashes  PHYSICAL EXAM: Filed Vitals:   03/31/14 0804  BP: 132/80  Pulse: 92   General: No acute distress Head:  Normocephalic/atraumatic Eyes:  Fundoscopic exam unremarkable without vessel changes, exudates, hemorrhages or papilledema. Neck: supple, no paraspinal tenderness, full range of motion Heart:  Regular rate and rhythm Lungs:  Clear to auscultation bilaterally Back: No paraspinal tenderness Neurological Exam: alert and oriented to person, place, and time. Attention span and concentration intact, recent and remote memory intact, fund of knowledge intact.  Speech fluent and not dysarthric, language intact.  CN II-XII intact, bulk and tone normal, muscle strength 5/5 throughout, sensation to light touch intact, deep tendon reflexes 2+ throughout, toes downgoing, finger to nose intact, gait without ataxia, difficulty walking in tandem, Romberg negative.  IMPRESSION: Generalized tonic seizures.  PLAN: Recommend starting a third agent, Vimpat.  Titration pack provided.  He is instructed to call next week for refill of maintenance dose of 100mg  twice daily. Keppra 1500mg  twice daily and phenytoin ER 300mg  at bedtime Calcium 600mg  twice daily and vitamin D 800 units daily Check phenytoin level No driving Follow up in 3 months.  He repeatedly asked me if his seizures were related to lead poisoning from  several years ago.  I answered that it can cause seizures acutely, but I cannot say if his current seizures are related since he no longer has lead poisoning.  It is unlikely that prior lead poisoning from several years ago would be the cause of chronic seizures over the next several years.  I tried explaining to him to the best of my ability.  I told him that often we do not know the specific cause for seizures.  However, regardless we need to treat him to try and prevent further seizures from occuring.  He was not satisfied with this answer and he left the office disgruntled.  30 minutes spent with patient, over 50% spent discussing diagnosis and coordinating plan.  Metta Clines, DO  CC: Reynold Bowen, MD

## 2014-04-02 ENCOUNTER — Telehealth: Payer: Self-pay | Admitting: *Deleted

## 2014-04-02 LAB — PHENYTOIN LEVEL, TOTAL: PHENYTOIN LVL: 18.3 ug/mL (ref 10.0–20.0)

## 2014-04-02 NOTE — Telephone Encounter (Signed)
-----   Message from Dudley Major, DO sent at 04/02/2014  8:09 AM EST ----- Level is okay ----- Message -----    From: [facility] Interface    Sent: 04/02/2014   7:54 AM      To: Dudley Major, DO

## 2014-04-02 NOTE — Telephone Encounter (Signed)
Patient is aware of normal lab work  

## 2014-04-20 ENCOUNTER — Ambulatory Visit: Payer: Medicare HMO | Admitting: Neurology

## 2014-05-27 ENCOUNTER — Other Ambulatory Visit: Payer: Self-pay | Admitting: Neurology

## 2014-06-19 ENCOUNTER — Encounter (HOSPITAL_COMMUNITY): Payer: Self-pay | Admitting: Emergency Medicine

## 2014-06-19 ENCOUNTER — Emergency Department (HOSPITAL_COMMUNITY): Payer: PPO

## 2014-06-19 ENCOUNTER — Emergency Department (HOSPITAL_COMMUNITY)
Admission: EM | Admit: 2014-06-19 | Discharge: 2014-06-19 | Disposition: A | Discharge status: Home / Self Care | Payer: PPO | Attending: Emergency Medicine | Admitting: Emergency Medicine

## 2014-06-19 DIAGNOSIS — I129 Hypertensive chronic kidney disease with stage 1 through stage 4 chronic kidney disease, or unspecified chronic kidney disease: Secondary | ICD-10-CM | POA: Insufficient documentation

## 2014-06-19 DIAGNOSIS — Z862 Personal history of diseases of the blood and blood-forming organs and certain disorders involving the immune mechanism: Secondary | ICD-10-CM | POA: Insufficient documentation

## 2014-06-19 DIAGNOSIS — Z87448 Personal history of other diseases of urinary system: Secondary | ICD-10-CM | POA: Insufficient documentation

## 2014-06-19 DIAGNOSIS — R42 Dizziness and giddiness: Secondary | ICD-10-CM | POA: Insufficient documentation

## 2014-06-19 DIAGNOSIS — N189 Chronic kidney disease, unspecified: Secondary | ICD-10-CM | POA: Diagnosis not present

## 2014-06-19 DIAGNOSIS — Z720 Tobacco use: Secondary | ICD-10-CM | POA: Insufficient documentation

## 2014-06-19 DIAGNOSIS — J449 Chronic obstructive pulmonary disease, unspecified: Secondary | ICD-10-CM | POA: Insufficient documentation

## 2014-06-19 DIAGNOSIS — G40909 Epilepsy, unspecified, not intractable, without status epilepticus: Secondary | ICD-10-CM | POA: Diagnosis not present

## 2014-06-19 DIAGNOSIS — T420X1A Poisoning by hydantoin derivatives, accidental (unintentional), initial encounter: Secondary | ICD-10-CM

## 2014-06-19 DIAGNOSIS — R270 Ataxia, unspecified: Secondary | ICD-10-CM

## 2014-06-19 DIAGNOSIS — R260 Ataxic gait: Secondary | ICD-10-CM | POA: Diagnosis not present

## 2014-06-19 DIAGNOSIS — R4789 Other speech disturbances: Secondary | ICD-10-CM | POA: Diagnosis not present

## 2014-06-19 DIAGNOSIS — Y9289 Other specified places as the place of occurrence of the external cause: Secondary | ICD-10-CM | POA: Insufficient documentation

## 2014-06-19 DIAGNOSIS — Z8639 Personal history of other endocrine, nutritional and metabolic disease: Secondary | ICD-10-CM | POA: Diagnosis not present

## 2014-06-19 DIAGNOSIS — Y9389 Activity, other specified: Secondary | ICD-10-CM | POA: Diagnosis not present

## 2014-06-19 DIAGNOSIS — R531 Weakness: Secondary | ICD-10-CM | POA: Diagnosis present

## 2014-06-19 LAB — RAPID URINE DRUG SCREEN, HOSP PERFORMED
AMPHETAMINES: NOT DETECTED
BARBITURATES: NOT DETECTED
Benzodiazepines: NOT DETECTED
Cocaine: NOT DETECTED
OPIATES: NOT DETECTED
Tetrahydrocannabinol: NOT DETECTED

## 2014-06-19 LAB — I-STAT CHEM 8, ED
BUN: 19 mg/dL (ref 6–23)
CHLORIDE: 104 mmol/L (ref 96–112)
Calcium, Ion: 1.19 mmol/L (ref 1.13–1.30)
Creatinine, Ser: 1.3 mg/dL (ref 0.50–1.35)
Glucose, Bld: 85 mg/dL (ref 70–99)
HCT: 40 % (ref 39.0–52.0)
Hemoglobin: 13.6 g/dL (ref 13.0–17.0)
Potassium: 4.3 mmol/L (ref 3.5–5.1)
SODIUM: 140 mmol/L (ref 135–145)
TCO2: 22 mmol/L (ref 0–100)

## 2014-06-19 LAB — URINALYSIS, ROUTINE W REFLEX MICROSCOPIC
Bilirubin Urine: NEGATIVE
Glucose, UA: NEGATIVE mg/dL
Hgb urine dipstick: NEGATIVE
Ketones, ur: 15 mg/dL — AB
LEUKOCYTES UA: NEGATIVE
NITRITE: NEGATIVE
PROTEIN: NEGATIVE mg/dL
SPECIFIC GRAVITY, URINE: 1.023 (ref 1.005–1.030)
Urobilinogen, UA: 0.2 mg/dL (ref 0.0–1.0)
pH: 5 (ref 5.0–8.0)

## 2014-06-19 LAB — COMPREHENSIVE METABOLIC PANEL
ALT: 12 U/L (ref 0–53)
AST: 20 U/L (ref 0–37)
Albumin: 3.4 g/dL — ABNORMAL LOW (ref 3.5–5.2)
Alkaline Phosphatase: 126 U/L — ABNORMAL HIGH (ref 39–117)
Anion gap: 9 (ref 5–15)
BILIRUBIN TOTAL: 0.5 mg/dL (ref 0.3–1.2)
BUN: 18 mg/dL (ref 6–23)
CALCIUM: 9.2 mg/dL (ref 8.4–10.5)
CO2: 26 mmol/L (ref 19–32)
CREATININE: 1.35 mg/dL (ref 0.50–1.35)
Chloride: 103 mmol/L (ref 96–112)
GFR calc non Af Amer: 52 mL/min — ABNORMAL LOW (ref >90)
GFR, EST AFRICAN AMERICAN: 60 mL/min — AB (ref >90)
Glucose, Bld: 86 mg/dL (ref 70–99)
Potassium: 4.4 mmol/L (ref 3.5–5.1)
Sodium: 138 mmol/L (ref 135–145)
TOTAL PROTEIN: 6.7 g/dL (ref 6.0–8.3)

## 2014-06-19 LAB — CBC
HEMATOCRIT: 37.4 % — AB (ref 39.0–52.0)
Hemoglobin: 12.4 g/dL — ABNORMAL LOW (ref 13.0–17.0)
MCH: 30.2 pg (ref 26.0–34.0)
MCHC: 33.2 g/dL (ref 30.0–36.0)
MCV: 91 fL (ref 78.0–100.0)
Platelets: 159 10*3/uL (ref 150–400)
RBC: 4.11 MIL/uL — AB (ref 4.22–5.81)
RDW: 13.9 % (ref 11.5–15.5)
WBC: 5.5 10*3/uL (ref 4.0–10.5)

## 2014-06-19 LAB — DIFFERENTIAL
BASOS PCT: 1 % (ref 0–1)
Basophils Absolute: 0 10*3/uL (ref 0.0–0.1)
Eosinophils Absolute: 0.1 10*3/uL (ref 0.0–0.7)
Eosinophils Relative: 2 % (ref 0–5)
LYMPHS PCT: 40 % (ref 12–46)
Lymphs Abs: 2.2 10*3/uL (ref 0.7–4.0)
MONO ABS: 0.6 10*3/uL (ref 0.1–1.0)
Monocytes Relative: 10 % (ref 3–12)
NEUTROS PCT: 47 % (ref 43–77)
Neutro Abs: 2.6 10*3/uL (ref 1.7–7.7)

## 2014-06-19 LAB — PHENYTOIN LEVEL, TOTAL: PHENYTOIN LVL: 37.6 ug/mL — AB (ref 10.0–20.0)

## 2014-06-19 LAB — APTT: aPTT: 33 seconds (ref 24–37)

## 2014-06-19 LAB — PROTIME-INR
INR: 1.18 (ref 0.00–1.49)
PROTHROMBIN TIME: 15.2 s (ref 11.6–15.2)

## 2014-06-19 LAB — I-STAT TROPONIN, ED: Troponin i, poc: 0 ng/mL (ref 0.00–0.08)

## 2014-06-19 NOTE — ED Notes (Signed)
Patient transported to CT 

## 2014-06-19 NOTE — ED Notes (Signed)
CRITICAL VALUE ALERT  Critical value received: Dilantin 37.6  Date of notification:  06/19/14  Time of notification:  1826  Critical value read back: yes  Nurse who received alert:  Julieanne Manson, RN  MD notified (1st page):  Colin Rhein, MD

## 2014-06-19 NOTE — Discharge Instructions (Signed)
Do not take your phenytoin until instructed to by your physician.  You should call your neurologist ASAP.  Do not attempt to ambulate unassisted.  Phenytoin Toxicity Phenytoin is a medicine that is used to help control many seizure problems. Phenytoin toxicity means the amount of phenytoin in the blood is high enough to cause problems. Your caregiver will carefully and frequently monitor the amount of phenytoin in the blood to make sure that just the right dose is given. How you feel and your blood test information help guide your caregiver in making sure that a specific dose is best for you.  CAUSES  Changes in other prescription medicines can directly influence the amount of phenytoin in the blood, even though the amount of phenytoin taken remains unchanged. The following medicines may raise the level of phenytoin in the blood:   Amiodarone.  Chlordiazepoxide.  Diazepam.  Dicumarol (blood thinner).  Disulfiram.  Estrogens (hormone replacement).  Aspirin and medicines containing salicylates.  Sulfonamides.  Tolbutamide (used for diabetes management).  Famotidine (used for ulcer disease).  Isoniazid (antibiotic).  Methylphenidate (used for attention deficit disorders).  Phenothiazines (used for nausea).  Trazodone (used for depression and sleep problems).  Primidone.  Fluconazole (used for yeast infections).  Valproic acid (used for seizures). Patients taking many medicines for a variety of ailments may accidentally take too much phenytoin. A change in metabolism or heavy, binge drinking of alcohol can also raise the level of phenytoin. In rare cases, problems develop when phenytoin is given directly into a vein, and it is given too fast. Emergency department and hospital staff giving phenytoin this way avoid this problem by the use of intravenous (IV) pumps and careful monitoring. SYMPTOMS  In mild cases, symptoms may not be pronounced. They may develop slowly over weeks or  months. They can include:   Poor balance or weakness when walking.  Slow movement with all activity.  Tremors or shaking.  Irritability.  Confusion.  Loss of control of urine.  Trouble speaking or swallowing.  Double vision.  Tender and swollen gums.  Unusual pains in the arms or legs.  Abnormal hair growth.  Nausea or vomiting.  Problems with functioning of the thyroid gland. In severe cases there may be:  Disorientation.  Severe confusion.  Coma.  Uncontrolled movement of arms or legs.  Trouble breathing.  Yellowing of the skin or eyes (jaundice).  Skin rashes.  Swelling of the face.  Abdominal pain with possible enlargement of the liver or spleen. In some cases, too much phenytoin is taken intentionally in a suicide attempt. This can be very serious and requires emergency care and hospitalization. DIAGNOSIS  Your caregiver will ask for several blood tests, such as:  A blood count.  Kidney or liver tests.  Levels of glucose (sugar) and normal salts in the blood.  Phenytoin and other medicine levels.  Pregnancy test, when appropriate. Other tests may also be needed such as:  Urine test (urinalysis).  A test that records the electrical activity of the heart (electrocardiography, ECG).  X-rays. If you are experiencing confusion or disorientation, a computerized X-ray scan (CT or CAT scan) of the head may be needed.  TREATMENT  In mild cases, the phenytoin is stopped for a period of time as directed by your caregiver. The time period might be as short as only 1 or 2 days and will be followed by careful checking of symptoms and repeat blood tests. A review of all medicines (prescription, nonprescription, and herbal) should be done  at this time. In more severe cases, hospitalization is mandatory and may include monitoring in an intensive care unit. If there is severe disorientation or coma, or if problems are found in the liver or heart, help is  obtained from medical specialists. HOME CARE INSTRUCTIONS   Be careful when taking your daily medicines. It is very important to ask for help if you need it. The same problems can develop again if medicine dosages change.  Follow through with blood tests and office visits as directed by your caregiver. SEEK MEDICAL CARE IF:   You are confused about how to take your medicines.  You plan to adjust dosages of any of your prescription or nonprescription medicines. This may affect the level of phenytoin in your blood.  You develop any of the "mild case" symptoms described above.  You have episodes of falling or have problems keeping your balance. SEEK IMMEDIATE MEDICAL CARE IF:   You fall and are injured.  You develop any of the "severe case" symptoms described above. MAKE SURE YOU:   Understand these instructions.  Will watch your condition.  Will get help right away if you are not doing well or get worse. Document Released: 07/12/2006 Document Revised: 07/27/2013 Document Reviewed: 02/28/2007 [facility] Patient Information 2015 [place], [place]. This information is not intended to replace advice given to you by your health care provider. Make sure you discuss any questions you have with your health care provider.

## 2014-06-19 NOTE — ED Provider Notes (Signed)
Assumed care of pt from Dr. Rogene Houston pending dilantin level.  Pt with ataxia, slurred speech.  Has a ho similar symptoms with dilantin toxicity.  Wu with elevated dilantin level, which is likely the source of his symptoms.  I discussed that the pt had ongoing ataxia with both he and his family, and unfortunately, despite some concern for the patient's ability to care for himself at home, the pt refused admission.  The pt is able to ambulate, and doubt CVA as etiology given clearly elevated dilantin level.  I feel he is competent to make the decision to return home.  DC home in stable condition to fu with PCP.  Debby Freiberg, MD 06/20/14 0111

## 2014-06-19 NOTE — ED Provider Notes (Signed)
CSN: [id number]     Arrival date & time 06/19/14  1444 History   First MD Initiated Contact with Patient 06/19/14 1457     Chief Complaint  Patient presents with  . Weakness  . Aphasia     (Consider location/radiation/quality/duration/timing/severity/associated sxs/prior Treatment) Patient is a 69 y.o. male presenting with weakness. The history is provided by the patient.  Weakness Pertinent negatives include no chest pain, no abdominal pain, no headaches and no shortness of breath.   patient brought in by EMS. Patient's been having difficulty walking and falling since Thursday. Patient has a known history of seizures for which she is on Keppra and Dilantin. Patient also has a history of COPD. Family noted some slurred speech today. Patient had similar symptoms but milder back in the fall when his Dilantin level was elevated. Patient denies headache any new or worse shortness of breath does have sort of a chronic cough.  Past Medical History  Diagnosis Date  . Hypertension   . Seizures   . COPD (chronic obstructive pulmonary disease)   . Thrombocytopenia   . 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 BPH (benign prostatic hypertrophy)   . Erectile dysfunction   . Empty sella syndrome     History of  . Chronic kidney disease   . Hyponatremia     history of   Past Surgical History  Procedure Laterality Date  . 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 No pertinent family history. History  Substance Use Topics  . Smoking status: Current Every Day Smoker -- 1.00 packs/day for 47 years    Types: Cigarettes  . Smokeless tobacco: Never Used  . Alcohol Use: No    Review of Systems  Constitutional: Negative for fever.  HENT: Negative for congestion.   Eyes: Negative for visual disturbance.  Respiratory: Negative for shortness of breath.   Cardiovascular: Negative for chest pain.  Gastrointestinal: Negative for abdominal pain.  Genitourinary: Negative for dysuria.   Musculoskeletal: Negative for back pain.  Skin: Negative for rash.  Neurological: Positive for dizziness, speech difficulty and weakness. Negative for syncope, facial asymmetry, numbness and headaches.  Hematological: Does not bruise/bleed easily.  Psychiatric/Behavioral: Negative for confusion.      Allergies  Review of patient's allergies indicates no known allergies.  Home Medications   Prior to Admission medications   Medication Sig Start Date End Date Taking? Authorizing Provider  bromocriptine (PARLODEL) 2.5 MG tablet Take 2.5 mg by mouth 2 (two) times daily.     Historical Provider, MD  lacosamide (VIMPAT) 50 MG TABS tablet Take 1 tablet (50 mg total) by mouth 2 (two) times daily. 03/31/14   Pieter Partridge, DO  levETIRAcetam (KEPPRA) 750 MG tablet Take 1,500 mg by mouth every 12 (twelve) hours.    Historical Provider, MD  olmesartan (BENICAR) 20 MG tablet Take 10 mg by mouth every other day.     Historical Provider, MD  phenytoin (DILANTIN) 100 MG ER capsule take 4 capsules by mouth once daily 05/27/14   Pieter Partridge, DO   BP 152/79 mmHg  Pulse 66  Temp(Src) 98.4 F (36.9 C) (Oral)  Resp 17  Ht 5' 9.5" (1.765 m)  Wt 177 lb (80.287 kg)  BMI 25.77 kg/m2  SpO2 96% Physical Exam  Constitutional: He is oriented to person, place, and time. He appears well-developed and well-nourished. No distress.  HENT:  Head: Normocephalic and atraumatic.  Mouth/Throat: Oropharynx is clear and moist.  Eyes: Conjunctivae and  EOM are normal. Pupils are equal, round, and reactive to light.  Neck: Normal range of motion. Neck supple.  Cardiovascular: Normal rate, regular rhythm and normal heart sounds.   Pulmonary/Chest: Effort normal and breath sounds normal.  Abdominal: Soft. Bowel sounds are normal.  Musculoskeletal: Normal range of motion.  Neurological: He is alert and oriented to person, place, and time. No cranial nerve deficit. He exhibits normal muscle tone. Coordination normal.  Skin:  Skin is warm. No rash noted.  Nursing note and vitals reviewed.   ED Course  Procedures (including critical care time) Labs Review Labs Reviewed  CBC - Abnormal; Notable for the following:    RBC 4.11 (*)    Hemoglobin 12.4 (*)    HCT 37.4 (*)    All other components within normal limits  COMPREHENSIVE METABOLIC PANEL - Abnormal; Notable for the following:    Albumin 3.4 (*)    Alkaline Phosphatase 126 (*)    GFR calc non Af Amer 52 (*)    GFR calc Af Amer 60 (*)    All other components within normal limits  URINALYSIS, ROUTINE W REFLEX MICROSCOPIC - Abnormal; Notable for the following:    Ketones, ur 15 (*)    All other components within normal limits  PROTIME-INR  APTT  DIFFERENTIAL  URINE RAPID DRUG SCREEN (HOSP PERFORMED)  ETHANOL  PHENYTOIN LEVEL, TOTAL  I-STAT CHEM 8, ED  I-STAT TROPOININ, ED  I-STAT TROPOININ, ED   Results for orders placed or performed during the hospital encounter of 06/19/14  Protime-INR  Result Value Ref Range   Prothrombin Time 15.2 11.6 - 15.2 seconds   INR 1.18 0.00 - 1.49  APTT  Result Value Ref Range   aPTT 33 24 - 37 seconds  CBC  Result Value Ref Range   WBC 5.5 4.0 - 10.5 K/uL   RBC 4.11 (L) 4.22 - 5.81 MIL/uL   Hemoglobin 12.4 (L) 13.0 - 17.0 g/dL   HCT 37.4 (L) 39.0 - 52.0 %   MCV 91.0 78.0 - 100.0 fL   MCH 30.2 26.0 - 34.0 pg   MCHC 33.2 30.0 - 36.0 g/dL   RDW 13.9 11.5 - 15.5 %   Platelets 159 150 - 400 K/uL  Differential  Result Value Ref Range   Neutrophils Relative % 47 43 - 77 %   Neutro Abs 2.6 1.7 - 7.7 K/uL   Lymphocytes Relative 40 12 - 46 %   Lymphs Abs 2.2 0.7 - 4.0 K/uL   Monocytes Relative 10 3 - 12 %   Monocytes Absolute 0.6 0.1 - 1.0 K/uL   Eosinophils Relative 2 0 - 5 %   Eosinophils Absolute 0.1 0.0 - 0.7 K/uL   Basophils Relative 1 0 - 1 %   Basophils Absolute 0.0 0.0 - 0.1 K/uL  Comprehensive metabolic panel  Result Value Ref Range   Sodium 138 135 - 145 mmol/L   Potassium 4.4 3.5 - 5.1 mmol/L    Chloride 103 96 - 112 mmol/L   CO2 26 19 - 32 mmol/L   Glucose, Bld 86 70 - 99 mg/dL   BUN 18 6 - 23 mg/dL   Creatinine, Ser 1.35 0.50 - 1.35 mg/dL   Calcium 9.2 8.4 - 10.5 mg/dL   Total Protein 6.7 6.0 - 8.3 g/dL   Albumin 3.4 (L) 3.5 - 5.2 g/dL   AST 20 0 - 37 U/L   ALT 12 0 - 53 U/L   Alkaline Phosphatase 126 (H) 39 - 117  U/L   Total Bilirubin 0.5 0.3 - 1.2 mg/dL   GFR calc non Af Amer 52 (L) >90 mL/min   GFR calc Af Amer 60 (L) >90 mL/min   Anion gap 9 5 - 15  Urine Drug Screen  Result Value Ref Range   Opiates NONE DETECTED NONE DETECTED   Cocaine NONE DETECTED NONE DETECTED   Benzodiazepines NONE DETECTED NONE DETECTED   Amphetamines NONE DETECTED NONE DETECTED   Tetrahydrocannabinol NONE DETECTED NONE DETECTED   Barbiturates NONE DETECTED NONE DETECTED  Urinalysis, Routine w reflex microscopic  Result Value Ref Range   Color, Urine YELLOW YELLOW   APPearance CLEAR CLEAR   Specific Gravity, Urine 1.023 1.005 - 1.030   pH 5.0 5.0 - 8.0   Glucose, UA NEGATIVE NEGATIVE mg/dL   Hgb urine dipstick NEGATIVE NEGATIVE   Bilirubin Urine NEGATIVE NEGATIVE   Ketones, ur 15 (A) NEGATIVE mg/dL   Protein, ur NEGATIVE NEGATIVE mg/dL   Urobilinogen, UA 0.2 0.0 - 1.0 mg/dL   Nitrite NEGATIVE NEGATIVE   Leukocytes, UA NEGATIVE NEGATIVE  I-Stat Chem 8, ED  Result Value Ref Range   Sodium 140 135 - 145 mmol/L   Potassium 4.3 3.5 - 5.1 mmol/L   Chloride 104 96 - 112 mmol/L   BUN 19 6 - 23 mg/dL   Creatinine, Ser 1.30 0.50 - 1.35 mg/dL   Glucose, Bld 85 70 - 99 mg/dL   Calcium, Ion 1.19 1.13 - 1.30 mmol/L   TCO2 22 0 - 100 mmol/L   Hemoglobin 13.6 13.0 - 17.0 g/dL   HCT 40.0 39.0 - 52.0 %  I-stat troponin, ED (not at [facility])  Result Value Ref Range   Troponin i, poc 0.00 0.00 - 0.08 ng/mL   Comment 3             Imaging Review Ct Head Wo Contrast  06/19/2014   CLINICAL DATA:  Intermittent slurred speech, weakness starting Thursday  EXAM: CT HEAD WITHOUT CONTRAST  TECHNIQUE:  Contiguous axial images were obtained from the base of the skull through the vertex without intravenous contrast.  COMPARISON:  Brain MRI 01/12/2014  FINDINGS: No skull fracture is noted. There is mucosal thickening with partial opacification left maxillary sinus.  No intracranial hemorrhage, mass effect or midline shift. Moderate cerebral atrophy. Periventricular and patchy subcortical white matter decreased attenuation probable due to chronic small vessel ischemic changes. No definite acute cortical infarction. No mass lesion is noted on this unenhanced scan. The gray and white-matter differentiation is preserved.  IMPRESSION: No acute intracranial abnormality. Moderate cerebral atrophy. Periventricular and patchy subcortical white matter decreased attenuation probable due to chronic small vessel ischemic changes. Mucosal thickening with partial opacification left maxillary sinus.   Electronically Signed   By: Lahoma Crocker M.D.   On: 06/19/2014 17:21     EKG Interpretation   Date/Time:  Saturday June 19 2014 14:45:24 EDT Ventricular Rate:  75 PR Interval:  186 QRS Duration: 132 QT Interval:  421 QTC Calculation: 470 R Axis:   -53 Text Interpretation:  Sinus rhythm Nonspecific IVCD with LAD Left  ventricular hypertrophy Anterior Q waves, possibly due to LVH No  significant change since last tracing prolonged QT compared to last time  Confirmed by Karysa Heft  MD, Delwyn Scoggin [phone]) on 06/19/2014 4:43:56 PM      MDM   Final diagnoses:  Ataxia    Patient to be turned over to the evening physician Dr. Colin Rhein. Clinically very concerned about the ataxia be in due to  Dilantin toxicity. Dilantin level is pending. Head CT negative EKG without significant abnormalities. Labs without significant abnormalities other than some mild liver function test abnormalities. Patient symptoms are of difficulty with walking fell a couple times symptoms started on Thursday. If Dilantin level is not elevated that MRI will  be required to rule out stroke. As mentioned head CT is negative.  The patient neuro exam without any significant focal deficits.  Once Dilantin level is known if it is elevated patient can be re-walk to see if they are significant asymptomatic enough to go home and can be discharged home otherwise patient may require admission. Patient is followed by [facility] medical.    Fredia Sorrow, MD 06/19/14 [phone]

## 2014-06-19 NOTE — ED Notes (Signed)
Patient returned from CT

## 2014-06-19 NOTE — ED Notes (Signed)
Intermittent episodes of slurred speech and walking difficulties since Thursday; no change in severity today, but wife convinced him to come today. The ambulation difficulty is due to a weakness issue, not a balance issue he states. History of TIA, HTN, Seizures. Denies pain.

## 2014-06-21 ENCOUNTER — Telehealth: Payer: Self-pay | Admitting: Neurology

## 2014-06-21 NOTE — Telephone Encounter (Signed)
Please see below message I spoke with the patient spouse she is asking is there something else beside Dilantin he can take ? Please advise

## 2014-06-21 NOTE — Telephone Encounter (Signed)
Pt wife Mardene Celeste called and states that he went to the ER on Saturday. The Er states that the Dilantin is to high and to stop taking it. His speech is slurred and that he did not have a stroke. Please call her at [phone]

## 2014-06-21 NOTE — Telephone Encounter (Signed)
I spoke with patient and spouse they both state patient is taking dilantin 400 mg daily  Keppra 1500 mg bid and he has NOT taken any of the Vimpat starter kit . Patient states he does not want to take 14 pills  He is not going to take the Vimpat it is too many pills for his body .

## 2014-06-21 NOTE — Telephone Encounter (Signed)
Expand All Collapse All   I spoke with patient and spouse they both state patient is taking dilantin 400 mg daily Keppra 1500 mg bid and he has NOT taken any of the Vimpat starter kit . Patient states he does not want to take 14 pills He is not going to take the Vimpat it is too many pills for his body .

## 2014-06-21 NOTE — Telephone Encounter (Signed)
Have him drop the Dilantin ER to 300mg  at bedtime.  We will hold off on the Vimpat.  He should continue the Keppra 1500mg  twice daily.  I want to check a Keppra level.  I want to recheck a Dilantin level in one week.  He needs to follow up with me after that.

## 2014-06-21 NOTE — Telephone Encounter (Signed)
First of all, when was his last seizure?  Also, to clarify, he should have been taking dilantin 300mg  daily, Keppra 1500mg  twice daily and Vimpat 100mg  twice daily.  I would decrease dilantin to 200mg  daily and increase Vimpat to 150mg  twice daily.  Continue Keppra at current dose.  I would like to see how he is doing on this for two weeks.  He should follow up with me at that time.  The goal would be to taper him off the Dilantin

## 2014-06-21 NOTE — Telephone Encounter (Signed)
I spoke with patient and advised him per Dr Tomi Likens Have him drop the Dilantin ER to 300mg  at bedtime. We will hold off on the Vimpat. He should continue the Keppra 1500mg  twice daily. I want to check a Keppra level. I want to recheck a Dilantin level in one week. He needs to follow up with me after that. Patients states he cannot come in and have Keppra levels drawn now he has had a death his brother or brother in law died and he has to get his self together. He said he would call me when he has things together . I explained to him the importance to making sure the levels were not to high or low and that this could cause some serious issues if not watched very careful. He stated he would call when he got himself together . I also had him to repeat to me how to take him medication  Dilantin ER 300 mg (at bedtime only ) and to continue the Keppra 1500bid .and NO vimpat at this time . This was repeated 2 times to him and his spouse repeated it as well .

## 2014-10-12 ENCOUNTER — Other Ambulatory Visit: Payer: Self-pay | Admitting: Neurology

## 2015-02-10 ENCOUNTER — Other Ambulatory Visit: Payer: Self-pay | Admitting: Neurology

## 2015-02-10 NOTE — Telephone Encounter (Signed)
Last OV: 03/31/14 Next OV: Not scheduled.  3 month f/u was requested.  1 month prescription sent in w/ note to schedule.

## 2015-03-16 ENCOUNTER — Other Ambulatory Visit: Payer: Self-pay | Admitting: Neurology

## 2015-03-16 NOTE — Telephone Encounter (Signed)
Pt not seen since first of year. Scheduled for 07/21/15. Wife aware that no more refills will be given.

## 2015-05-03 ENCOUNTER — Emergency Department (HOSPITAL_COMMUNITY): Payer: PPO

## 2015-05-03 ENCOUNTER — Inpatient Hospital Stay (HOSPITAL_COMMUNITY)
Admission: EM | Admit: 2015-05-03 | Discharge: 2015-05-09 | DRG: 246 | Disposition: A | Discharge status: Home / Self Care | Payer: PPO | Attending: Internal Medicine | Admitting: Internal Medicine

## 2015-05-03 ENCOUNTER — Encounter (HOSPITAL_COMMUNITY): Payer: Self-pay | Admitting: Nurse Practitioner

## 2015-05-03 DIAGNOSIS — I25110 Atherosclerotic heart disease of native coronary artery with unstable angina pectoris: Secondary | ICD-10-CM | POA: Diagnosis present

## 2015-05-03 DIAGNOSIS — D638 Anemia in other chronic diseases classified elsewhere: Secondary | ICD-10-CM | POA: Diagnosis present

## 2015-05-03 DIAGNOSIS — R042 Hemoptysis: Secondary | ICD-10-CM | POA: Diagnosis present

## 2015-05-03 DIAGNOSIS — F17210 Nicotine dependence, cigarettes, uncomplicated: Secondary | ICD-10-CM | POA: Diagnosis present

## 2015-05-03 DIAGNOSIS — I200 Unstable angina: Secondary | ICD-10-CM | POA: Diagnosis present

## 2015-05-03 DIAGNOSIS — J984 Other disorders of lung: Secondary | ICD-10-CM

## 2015-05-03 DIAGNOSIS — R918 Other nonspecific abnormal finding of lung field: Secondary | ICD-10-CM | POA: Diagnosis present

## 2015-05-03 DIAGNOSIS — Z66 Do not resuscitate: Secondary | ICD-10-CM | POA: Diagnosis present

## 2015-05-03 DIAGNOSIS — J440 Chronic obstructive pulmonary disease with acute lower respiratory infection: Secondary | ICD-10-CM | POA: Diagnosis present

## 2015-05-03 DIAGNOSIS — R569 Unspecified convulsions: Secondary | ICD-10-CM | POA: Diagnosis not present

## 2015-05-03 DIAGNOSIS — R197 Diarrhea, unspecified: Secondary | ICD-10-CM | POA: Diagnosis present

## 2015-05-03 DIAGNOSIS — C3490 Malignant neoplasm of unspecified part of unspecified bronchus or lung: Secondary | ICD-10-CM | POA: Diagnosis present

## 2015-05-03 DIAGNOSIS — C801 Malignant (primary) neoplasm, unspecified: Secondary | ICD-10-CM | POA: Insufficient documentation

## 2015-05-03 DIAGNOSIS — R198 Other specified symptoms and signs involving the digestive system and abdomen: Secondary | ICD-10-CM | POA: Diagnosis present

## 2015-05-03 DIAGNOSIS — G40909 Epilepsy, unspecified, not intractable, without status epilepticus: Secondary | ICD-10-CM | POA: Diagnosis present

## 2015-05-03 DIAGNOSIS — I130 Hypertensive heart and chronic kidney disease with heart failure and stage 1 through stage 4 chronic kidney disease, or unspecified chronic kidney disease: Secondary | ICD-10-CM | POA: Diagnosis present

## 2015-05-03 DIAGNOSIS — N183 Chronic kidney disease, stage 3 unspecified: Secondary | ICD-10-CM | POA: Diagnosis present

## 2015-05-03 DIAGNOSIS — I5022 Chronic systolic (congestive) heart failure: Secondary | ICD-10-CM | POA: Diagnosis present

## 2015-05-03 DIAGNOSIS — R931 Abnormal findings on diagnostic imaging of heart and coronary circulation: Secondary | ICD-10-CM | POA: Clinically undetermined

## 2015-05-03 DIAGNOSIS — J441 Chronic obstructive pulmonary disease with (acute) exacerbation: Secondary | ICD-10-CM | POA: Diagnosis present

## 2015-05-03 DIAGNOSIS — J9601 Acute respiratory failure with hypoxia: Secondary | ICD-10-CM | POA: Diagnosis present

## 2015-05-03 DIAGNOSIS — R0600 Dyspnea, unspecified: Secondary | ICD-10-CM

## 2015-05-03 DIAGNOSIS — C7970 Secondary malignant neoplasm of unspecified adrenal gland: Secondary | ICD-10-CM | POA: Diagnosis present

## 2015-05-03 DIAGNOSIS — I214 Non-ST elevation (NSTEMI) myocardial infarction: Principal | ICD-10-CM | POA: Diagnosis present

## 2015-05-03 DIAGNOSIS — E278 Other specified disorders of adrenal gland: Secondary | ICD-10-CM | POA: Clinically undetermined

## 2015-05-03 DIAGNOSIS — R0902 Hypoxemia: Secondary | ICD-10-CM

## 2015-05-03 DIAGNOSIS — J189 Pneumonia, unspecified organism: Secondary | ICD-10-CM | POA: Diagnosis present

## 2015-05-03 DIAGNOSIS — Z955 Presence of coronary angioplasty implant and graft: Secondary | ICD-10-CM

## 2015-05-03 DIAGNOSIS — N400 Enlarged prostate without lower urinary tract symptoms: Secondary | ICD-10-CM | POA: Diagnosis present

## 2015-05-03 DIAGNOSIS — J438 Other emphysema: Secondary | ICD-10-CM | POA: Diagnosis present

## 2015-05-03 DIAGNOSIS — R7989 Other specified abnormal findings of blood chemistry: Secondary | ICD-10-CM

## 2015-05-03 DIAGNOSIS — Z720 Tobacco use: Secondary | ICD-10-CM | POA: Diagnosis present

## 2015-05-03 DIAGNOSIS — R9431 Abnormal electrocardiogram [ECG] [EKG]: Secondary | ICD-10-CM | POA: Diagnosis present

## 2015-05-03 DIAGNOSIS — Z77011 Contact with and (suspected) exposure to lead: Secondary | ICD-10-CM | POA: Diagnosis present

## 2015-05-03 DIAGNOSIS — I2510 Atherosclerotic heart disease of native coronary artery without angina pectoris: Secondary | ICD-10-CM | POA: Diagnosis present

## 2015-05-03 DIAGNOSIS — D649 Anemia, unspecified: Secondary | ICD-10-CM | POA: Diagnosis present

## 2015-05-03 DIAGNOSIS — Z79899 Other long term (current) drug therapy: Secondary | ICD-10-CM

## 2015-05-03 DIAGNOSIS — I10 Essential (primary) hypertension: Secondary | ICD-10-CM | POA: Diagnosis present

## 2015-05-03 DIAGNOSIS — K921 Melena: Secondary | ICD-10-CM | POA: Diagnosis present

## 2015-05-03 DIAGNOSIS — Z89022 Acquired absence of left finger(s): Secondary | ICD-10-CM

## 2015-05-03 DIAGNOSIS — R778 Other specified abnormalities of plasma proteins: Secondary | ICD-10-CM | POA: Diagnosis present

## 2015-05-03 HISTORY — DX: Essential (primary) hypertension: I10

## 2015-05-03 HISTORY — DX: Chronic kidney disease, stage 3 unspecified: N18.30

## 2015-05-03 HISTORY — DX: Tobacco use: Z72.0

## 2015-05-03 HISTORY — DX: Chronic kidney disease, stage 3 (moderate): N18.3

## 2015-05-03 NOTE — ED Notes (Signed)
Bed: HY07 Expected date:  Expected time:  Means of arrival:  Comments: EMS weakness

## 2015-05-03 NOTE — ED Notes (Signed)
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

## 2015-05-03 NOTE — ED Provider Notes (Signed)
CSN: [id number]     Arrival date & time 05/03/15  2246 History   First MD Initiated By signing my name below, I, Doran Stabler, attest that this documentation has been prepared under the direction and in the presence of Merryl Hacker, MD. Electronically Signed: Doran Stabler, ED Scribe. 05/03/2015. 12:11 AM.   Chief Complaint  Patient presents with  . Weakness  . Hyperglycemia   The history is provided by the patient and the spouse. No language interpreter was used.   HPI Comments: Cristian Moss is a 70 y.o. male with a h/o lead exposure and HTN who presents to the Emergency Department complaining of a possible seizure, PTA. Pts spouse states that the pt had a "blank stare, out into space and was hunched over during this episode" which is typical. Spouse states she usually is able to call out to the pt and receive a response, but this time she did not. She also reports this seizure was longer than usual lasting several minutes. Pts spouse states the pt is returning to baseline but still is not his usual self. She reports the pts last seizure was on "election day." Pt reports SOB. Pt denies any fever, chills, or any other sx at this time. Pt takes 100 mg Phenytoin and 50 mg Keppra.   Past Medical History  Diagnosis Date  . Hypertension   . Seizures ([place])   . COPD (chronic obstructive pulmonary disease) ([place])   . Thrombocytopenia ([place])   . 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 BPH (benign prostatic hypertrophy)   . Erectile dysfunction   . Empty sella syndrome ([place])     History of  . Chronic kidney disease   . Hyponatremia     history of   Past Surgical History  Procedure Laterality Date  . 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 No pertinent family history. Social History  Substance Use Topics  . Smoking status: Current Every Day Smoker -- 1.00 packs/day for 47 years    Types: Cigarettes  . Smokeless tobacco: Never Used  . Alcohol Use: No    Review of Systems   Constitutional: Negative for fever and chills.  Respiratory: Positive for shortness of breath.   Neurological: Positive for seizures.  All other systems reviewed and are negative.  Allergies  Review of patient's allergies indicates no known allergies.  Home Medications   Prior to Admission medications   Medication Sig Start Date End Date Taking? Authorizing Provider  bromocriptine (PARLODEL) 2.5 MG tablet Take 2.5 mg by mouth 2 (two) times daily.    Yes Historical Provider, MD  levETIRAcetam (KEPPRA) 750 MG tablet Take 1,500 mg by mouth every 12 (twelve) hours.   Yes Historical Provider, MD  losartan (COZAAR) 50 MG tablet Take 50 mg by mouth daily. 04/14/15  Yes Historical Provider, MD  phenytoin (DILANTIN) 100 MG ER capsule Take 3 capsules (300 mg total) by mouth at bedtime. 03/16/15  Yes Pieter Partridge, DO  lacosamide (VIMPAT) 50 MG TABS tablet Take 1 tablet (50 mg total) by mouth 2 (two) times daily. Patient not taking: Reported on 05/03/2015 03/31/14   Pieter Partridge, DO   BP 105/76 mmHg  Pulse 113  Temp(Src) 99.5 F (37.5 C) (Oral)  Resp 16  SpO2 94% Physical Exam  Constitutional: He is oriented to person, place, and time. No distress.  Elderly, no acute distress  HENT:  Head: Normocephalic and atraumatic.  Eyes: EOM are normal. Pupils are equal,  round, and reactive to light.  Neck: Neck supple.  Cardiovascular: Normal rate, regular rhythm and normal heart sounds.   No murmur heard. Pulmonary/Chest: Effort normal. No respiratory distress. He has no wheezes.  Coarse breath sounds bilaterally Rales noted right mid lower lung  Abdominal: Soft. Bowel sounds are normal. There is no tenderness. There is no rebound and no guarding.  Musculoskeletal: He exhibits no edema.  Neurological: He is alert and oriented to person, place, and time.  Cranial nerves II through XII intact, 5 out of 5 strength in all 4 extremities  Skin: Skin is warm and dry.  Psychiatric: He has a normal mood and  affect.  Nursing note and vitals reviewed.   ED Course  Procedures  DIAGNOSTIC STUDIES: Oxygen Saturation is 94% on room air, normal by my interpretation.    COORDINATION OF CARE: 11:08 PM Will order CXR, blood work, and EKG.  Discussed treatment plan with pt at bedside and pt agreed to plan.  Labs Review Labs Reviewed  CBC WITH DIFFERENTIAL/PLATELET - Abnormal; Notable for the following:    WBC 10.8 (*)    RBC 3.54 (*)    Hemoglobin 10.4 (*)    HCT 32.5 (*)    Neutro Abs 8.1 (*)    All other components within normal limits  COMPREHENSIVE METABOLIC PANEL - Abnormal; Notable for the following:    Glucose, Bld 101 (*)    BUN 24 (*)    Albumin 3.2 (*)    ALT 16 (*)    GFR calc non Af Amer 57 (*)    All other components within normal limits  TROPONIN I - Abnormal; Notable for the following:    Troponin I 0.13 (*)    All other components within normal limits  CBG MONITORING, ED - Abnormal; Notable for the following:    Glucose-Capillary 101 (*)    All other components within normal limits  CULTURE, BLOOD (ROUTINE X 2)  CULTURE, BLOOD (ROUTINE X 2)  PHENYTOIN LEVEL, TOTAL  URINALYSIS, ROUTINE W REFLEX MICROSCOPIC (NOT AT [facility])    Imaging Review Dg Chest 2 View  05/04/2015  CLINICAL DATA:  Acute onset of shortness of breath. Recent fall. Initial encounter. EXAM: CHEST  2 VIEW COMPARISON:  Chest radiograph performed 05/15/2011 FINDINGS: Dense right upper lobe airspace opacification is compatible with pneumonia. The left lung appears clear. No pleural effusion or pneumothorax is seen. The heart remains normal in size. No acute osseous abnormalities are identified. IMPRESSION: Dense right upper lobe airspace opacification, compatible with pneumonia. Followup PA and lateral chest X-ray is recommended in 3-4 weeks following trial of antibiotic therapy to ensure resolution and exclude underlying malignancy. Electronically Signed   By: Garald Balding M.D.   On: 05/04/2015 00:24   I have  personally reviewed and evaluated these images and lab results as part of my medical decision-making.   EKG Interpretation Sinus tachycardia, no acute ischemia, no significant change from prior      MDM   Final diagnoses:  CAP (community acquired pneumonia)  Seizure ([place])  Elevated troponin    Patient presents with concern for seizure activity. Has a history of seizure disorder and is on phenytoin and Keppra. Nontoxic on exam. Nonfocal. Also reports shortness of breath. Denies chest pain. Decreased breath sounds right lung.    Workup notable for chest x-ray with evidence of pneumonia. Risk factors for community-acquired pneumonia. He was given Rocephin and azithromycin. He also had minimally elevated troponin. Denies any chest pain at this time.  Given this in the setting of infection and recent seizure activity, will admit for observation ancillary all troponins. He was given full dose aspirin.  I personally performed the services described in this documentation, which was scribed in my presence. The recorded information has been reviewed and is accurate.    Merryl Hacker, MD 05/10/15 [phone]

## 2015-05-04 ENCOUNTER — Inpatient Hospital Stay (HOSPITAL_COMMUNITY): Payer: PPO

## 2015-05-04 ENCOUNTER — Encounter (HOSPITAL_COMMUNITY): Payer: Self-pay

## 2015-05-04 DIAGNOSIS — Z720 Tobacco use: Secondary | ICD-10-CM | POA: Diagnosis not present

## 2015-05-04 DIAGNOSIS — R7989 Other specified abnormal findings of blood chemistry: Secondary | ICD-10-CM

## 2015-05-04 DIAGNOSIS — N183 Chronic kidney disease, stage 3 unspecified: Secondary | ICD-10-CM | POA: Diagnosis present

## 2015-05-04 DIAGNOSIS — J441 Chronic obstructive pulmonary disease with (acute) exacerbation: Secondary | ICD-10-CM | POA: Diagnosis present

## 2015-05-04 DIAGNOSIS — R079 Chest pain, unspecified: Secondary | ICD-10-CM

## 2015-05-04 DIAGNOSIS — J189 Pneumonia, unspecified organism: Secondary | ICD-10-CM | POA: Diagnosis present

## 2015-05-04 DIAGNOSIS — Z66 Do not resuscitate: Secondary | ICD-10-CM | POA: Diagnosis present

## 2015-05-04 DIAGNOSIS — D638 Anemia in other chronic diseases classified elsewhere: Secondary | ICD-10-CM | POA: Diagnosis present

## 2015-05-04 DIAGNOSIS — F17210 Nicotine dependence, cigarettes, uncomplicated: Secondary | ICD-10-CM | POA: Diagnosis present

## 2015-05-04 DIAGNOSIS — C7970 Secondary malignant neoplasm of unspecified adrenal gland: Secondary | ICD-10-CM | POA: Diagnosis present

## 2015-05-04 DIAGNOSIS — R197 Diarrhea, unspecified: Secondary | ICD-10-CM | POA: Diagnosis present

## 2015-05-04 DIAGNOSIS — J984 Other disorders of lung: Secondary | ICD-10-CM | POA: Diagnosis not present

## 2015-05-04 DIAGNOSIS — E279 Disorder of adrenal gland, unspecified: Secondary | ICD-10-CM | POA: Diagnosis not present

## 2015-05-04 DIAGNOSIS — R569 Unspecified convulsions: Secondary | ICD-10-CM

## 2015-05-04 DIAGNOSIS — J440 Chronic obstructive pulmonary disease with acute lower respiratory infection: Secondary | ICD-10-CM | POA: Diagnosis present

## 2015-05-04 DIAGNOSIS — I2510 Atherosclerotic heart disease of native coronary artery without angina pectoris: Secondary | ICD-10-CM | POA: Diagnosis not present

## 2015-05-04 DIAGNOSIS — R918 Other nonspecific abnormal finding of lung field: Secondary | ICD-10-CM | POA: Diagnosis present

## 2015-05-04 DIAGNOSIS — I5022 Chronic systolic (congestive) heart failure: Secondary | ICD-10-CM | POA: Diagnosis present

## 2015-05-04 DIAGNOSIS — C3490 Malignant neoplasm of unspecified part of unspecified bronchus or lung: Secondary | ICD-10-CM | POA: Diagnosis present

## 2015-05-04 DIAGNOSIS — I130 Hypertensive heart and chronic kidney disease with heart failure and stage 1 through stage 4 chronic kidney disease, or unspecified chronic kidney disease: Secondary | ICD-10-CM | POA: Diagnosis present

## 2015-05-04 DIAGNOSIS — Z79899 Other long term (current) drug therapy: Secondary | ICD-10-CM | POA: Diagnosis not present

## 2015-05-04 DIAGNOSIS — J9601 Acute respiratory failure with hypoxia: Secondary | ICD-10-CM | POA: Diagnosis present

## 2015-05-04 DIAGNOSIS — R9431 Abnormal electrocardiogram [ECG] [EKG]: Secondary | ICD-10-CM | POA: Diagnosis not present

## 2015-05-04 DIAGNOSIS — C801 Malignant (primary) neoplasm, unspecified: Secondary | ICD-10-CM | POA: Diagnosis not present

## 2015-05-04 DIAGNOSIS — Z77011 Contact with and (suspected) exposure to lead: Secondary | ICD-10-CM | POA: Diagnosis present

## 2015-05-04 DIAGNOSIS — R778 Other specified abnormalities of plasma proteins: Secondary | ICD-10-CM | POA: Diagnosis present

## 2015-05-04 DIAGNOSIS — D649 Anemia, unspecified: Secondary | ICD-10-CM | POA: Diagnosis present

## 2015-05-04 DIAGNOSIS — I519 Heart disease, unspecified: Secondary | ICD-10-CM | POA: Diagnosis not present

## 2015-05-04 DIAGNOSIS — R931 Abnormal findings on diagnostic imaging of heart and coronary circulation: Secondary | ICD-10-CM | POA: Diagnosis not present

## 2015-05-04 DIAGNOSIS — I10 Essential (primary) hypertension: Secondary | ICD-10-CM | POA: Diagnosis not present

## 2015-05-04 DIAGNOSIS — R042 Hemoptysis: Secondary | ICD-10-CM | POA: Diagnosis present

## 2015-05-04 DIAGNOSIS — I214 Non-ST elevation (NSTEMI) myocardial infarction: Secondary | ICD-10-CM | POA: Diagnosis present

## 2015-05-04 DIAGNOSIS — I200 Unstable angina: Secondary | ICD-10-CM | POA: Diagnosis not present

## 2015-05-04 DIAGNOSIS — R198 Other specified symptoms and signs involving the digestive system and abdomen: Secondary | ICD-10-CM | POA: Diagnosis not present

## 2015-05-04 DIAGNOSIS — Z89022 Acquired absence of left finger(s): Secondary | ICD-10-CM | POA: Diagnosis not present

## 2015-05-04 DIAGNOSIS — N400 Enlarged prostate without lower urinary tract symptoms: Secondary | ICD-10-CM | POA: Diagnosis present

## 2015-05-04 DIAGNOSIS — I25110 Atherosclerotic heart disease of native coronary artery with unstable angina pectoris: Secondary | ICD-10-CM | POA: Diagnosis present

## 2015-05-04 DIAGNOSIS — K921 Melena: Secondary | ICD-10-CM | POA: Diagnosis present

## 2015-05-04 DIAGNOSIS — G40909 Epilepsy, unspecified, not intractable, without status epilepticus: Secondary | ICD-10-CM | POA: Diagnosis present

## 2015-05-04 LAB — COMPREHENSIVE METABOLIC PANEL
ALT: 14 U/L — AB (ref 17–63)
ALT: 16 U/L — ABNORMAL LOW (ref 17–63)
ANION GAP: 12 (ref 5–15)
AST: 22 U/L (ref 15–41)
AST: 28 U/L (ref 15–41)
Albumin: 3.1 g/dL — ABNORMAL LOW (ref 3.5–5.0)
Albumin: 3.2 g/dL — ABNORMAL LOW (ref 3.5–5.0)
Alkaline Phosphatase: 108 U/L (ref 38–126)
Alkaline Phosphatase: 113 U/L (ref 38–126)
Anion gap: 8 (ref 5–15)
BILIRUBIN TOTAL: 0.3 mg/dL (ref 0.3–1.2)
BILIRUBIN TOTAL: 0.4 mg/dL (ref 0.3–1.2)
BUN: 15 mg/dL (ref 6–20)
BUN: 24 mg/dL — ABNORMAL HIGH (ref 6–20)
CALCIUM: 9.1 mg/dL (ref 8.9–10.3)
CHLORIDE: 109 mmol/L (ref 101–111)
CO2: 22 mmol/L (ref 22–32)
CO2: 23 mmol/L (ref 22–32)
CREATININE: 0.86 mg/dL (ref 0.61–1.24)
Calcium: 8.3 mg/dL — ABNORMAL LOW (ref 8.9–10.3)
Chloride: 107 mmol/L (ref 101–111)
Creatinine, Ser: 1.23 mg/dL (ref 0.61–1.24)
GFR, EST NON AFRICAN AMERICAN: 57 mL/min — AB (ref >60)
Glucose, Bld: 101 mg/dL — ABNORMAL HIGH (ref 65–99)
Glucose, Bld: 101 mg/dL — ABNORMAL HIGH (ref 65–99)
POTASSIUM: 4.1 mmol/L (ref 3.5–5.1)
Potassium: 3.8 mmol/L (ref 3.5–5.1)
Sodium: 139 mmol/L (ref 135–145)
Sodium: 142 mmol/L (ref 135–145)
TOTAL PROTEIN: 7.1 g/dL (ref 6.5–8.1)
Total Protein: 7.4 g/dL (ref 6.5–8.1)

## 2015-05-04 LAB — CBC WITH DIFFERENTIAL/PLATELET
Basophils Absolute: 0 10*3/uL (ref 0.0–0.1)
Basophils Relative: 0 %
EOS ABS: 0.2 10*3/uL (ref 0.0–0.7)
Eosinophils Relative: 2 %
HCT: 32.5 % — ABNORMAL LOW (ref 39.0–52.0)
Hemoglobin: 10.4 g/dL — ABNORMAL LOW (ref 13.0–17.0)
LYMPHS ABS: 1.6 10*3/uL (ref 0.7–4.0)
Lymphocytes Relative: 15 %
MCH: 29.4 pg (ref 26.0–34.0)
MCHC: 32 g/dL (ref 30.0–36.0)
MCV: 91.8 fL (ref 78.0–100.0)
MONO ABS: 0.9 10*3/uL (ref 0.1–1.0)
MONOS PCT: 8 %
NEUTROS PCT: 75 %
Neutro Abs: 8.1 10*3/uL — ABNORMAL HIGH (ref 1.7–7.7)
Platelets: 237 10*3/uL (ref 150–400)
RBC: 3.54 MIL/uL — ABNORMAL LOW (ref 4.22–5.81)
RDW: 13.3 % (ref 11.5–15.5)
WBC: 10.8 10*3/uL — ABNORMAL HIGH (ref 4.0–10.5)

## 2015-05-04 LAB — URINALYSIS, ROUTINE W REFLEX MICROSCOPIC
Bilirubin Urine: NEGATIVE
GLUCOSE, UA: NEGATIVE mg/dL
Hgb urine dipstick: NEGATIVE
KETONES UR: NEGATIVE mg/dL
LEUKOCYTES UA: NEGATIVE
NITRITE: NEGATIVE
Protein, ur: NEGATIVE mg/dL
Specific Gravity, Urine: 1.015 (ref 1.005–1.030)
pH: 5 (ref 5.0–8.0)

## 2015-05-04 LAB — TYPE AND SCREEN
ABO/RH(D): O POS
ANTIBODY SCREEN: NEGATIVE

## 2015-05-04 LAB — CBC
HCT: 34.6 % — ABNORMAL LOW (ref 39.0–52.0)
Hemoglobin: 11 g/dL — ABNORMAL LOW (ref 13.0–17.0)
MCH: 29.1 pg (ref 26.0–34.0)
MCHC: 31.8 g/dL (ref 30.0–36.0)
MCV: 91.5 fL (ref 78.0–100.0)
PLATELETS: 230 10*3/uL (ref 150–400)
RBC: 3.78 MIL/uL — ABNORMAL LOW (ref 4.22–5.81)
RDW: 13.3 % (ref 11.5–15.5)
WBC: 10.7 10*3/uL — AB (ref 4.0–10.5)

## 2015-05-04 LAB — ABO/RH: ABO/RH(D): O POS

## 2015-05-04 LAB — TROPONIN I
TROPONIN I: 0.09 ng/mL — AB (ref <0.031)
Troponin I: 0.13 ng/mL — ABNORMAL HIGH (ref <0.031)

## 2015-05-04 LAB — STREP PNEUMONIAE URINARY ANTIGEN: STREP PNEUMO URINARY ANTIGEN: NEGATIVE

## 2015-05-04 LAB — RAPID URINE DRUG SCREEN, HOSP PERFORMED
AMPHETAMINES: NOT DETECTED
BENZODIAZEPINES: NOT DETECTED
Barbiturates: NOT DETECTED
Cocaine: NOT DETECTED
OPIATES: NOT DETECTED
TETRAHYDROCANNABINOL: NOT DETECTED

## 2015-05-04 LAB — C DIFFICILE QUICK SCREEN W PCR REFLEX
C DIFFICILE (CDIFF) INTERP: NEGATIVE
C DIFFICILE (CDIFF) TOXIN: NEGATIVE
C DIFFICLE (CDIFF) ANTIGEN: NEGATIVE

## 2015-05-04 LAB — INFLUENZA PANEL BY PCR (TYPE A & B)
H1N1 flu by pcr: NOT DETECTED
INFLBPCR: NEGATIVE
Influenza A By PCR: NEGATIVE

## 2015-05-04 LAB — CBG MONITORING, ED: Glucose-Capillary: 101 mg/dL — ABNORMAL HIGH (ref 65–99)

## 2015-05-04 LAB — PHENYTOIN LEVEL, TOTAL: PHENYTOIN LVL: 10.6 ug/mL (ref 10.0–20.0)

## 2015-05-04 LAB — OCCULT BLOOD X 1 CARD TO LAB, STOOL: FECAL OCCULT BLD: NEGATIVE

## 2015-05-04 MED ORDER — SODIUM CHLORIDE 0.9 % IV BOLUS (SEPSIS)
1000.0000 mL | Freq: Once | INTRAVENOUS | Status: AC
  Administered 2015-05-04: 1000 mL via INTRAVENOUS

## 2015-05-04 MED ORDER — METOPROLOL TARTRATE 12.5 MG HALF TABLET
12.5000 mg | ORAL_TABLET | Freq: Two times a day (BID) | ORAL | Status: DC
  Administered 2015-05-04 – 2015-05-09 (×11): 12.5 mg via ORAL
  Filled 2015-05-04 (×11): qty 1

## 2015-05-04 MED ORDER — DEXTROSE 5 % IV SOLN
500.0000 mg | INTRAVENOUS | Status: DC
Start: 2015-05-04 — End: 2015-05-05
  Administered 2015-05-05: 500 mg via INTRAVENOUS
  Filled 2015-05-04: qty 500

## 2015-05-04 MED ORDER — BUDESONIDE 0.5 MG/2ML IN SUSP
0.5000 mg | Freq: Two times a day (BID) | RESPIRATORY_TRACT | Status: DC
  Administered 2015-05-04 – 2015-05-09 (×10): 0.5 mg via RESPIRATORY_TRACT
  Filled 2015-05-04 (×14): qty 2

## 2015-05-04 MED ORDER — DEXTROSE 5 % IV SOLN
1.0000 g | INTRAVENOUS | Status: DC
  Administered 2015-05-04: 1 g via INTRAVENOUS
  Filled 2015-05-04: qty 10

## 2015-05-04 MED ORDER — BROMOCRIPTINE MESYLATE 2.5 MG PO TABS
2.5000 mg | ORAL_TABLET | Freq: Two times a day (BID) | ORAL | Status: DC
  Administered 2015-05-04 – 2015-05-09 (×11): 2.5 mg via ORAL
  Filled 2015-05-04 (×15): qty 1

## 2015-05-04 MED ORDER — POLYETHYLENE GLYCOL 3350 17 G PO PACK
17.0000 g | PACK | Freq: Every day | ORAL | Status: DC
  Administered 2015-05-05: 17 g via ORAL
  Filled 2015-05-04 (×5): qty 1

## 2015-05-04 MED ORDER — PHENYTOIN SODIUM EXTENDED 100 MG PO CAPS: 300.0000 mg | ORAL_CAPSULE | Freq: Every day | ORAL | Status: DC

## 2015-05-04 MED ORDER — ARFORMOTEROL TARTRATE 15 MCG/2ML IN NEBU
15.0000 ug | INHALATION_SOLUTION | Freq: Two times a day (BID) | RESPIRATORY_TRACT | Status: DC
  Administered 2015-05-05 – 2015-05-09 (×8): 15 ug via RESPIRATORY_TRACT
  Filled 2015-05-04 (×10): qty 2

## 2015-05-04 MED ORDER — LOSARTAN POTASSIUM 50 MG PO TABS
50.0000 mg | ORAL_TABLET | Freq: Every day | ORAL | Status: DC
  Administered 2015-05-04 – 2015-05-09 (×6): 50 mg via ORAL
  Filled 2015-05-04 (×7): qty 1

## 2015-05-04 MED ORDER — METHYLPREDNISOLONE SODIUM SUCC 40 MG IJ SOLR
40.0000 mg | Freq: Two times a day (BID) | INTRAMUSCULAR | Status: DC
  Administered 2015-05-04 – 2015-05-07 (×8): 40 mg via INTRAVENOUS
  Filled 2015-05-04 (×8): qty 1

## 2015-05-04 MED ORDER — AZITHROMYCIN 500 MG IV SOLR
INTRAVENOUS | Status: AC
  Filled 2015-05-04: qty 500

## 2015-05-04 MED ORDER — PHENYTOIN SODIUM EXTENDED 100 MG PO CAPS
300.0000 mg | ORAL_CAPSULE | Freq: Every day | ORAL | Status: DC
  Administered 2015-05-04 – 2015-05-08 (×5): 300 mg via ORAL
  Filled 2015-05-04 (×6): qty 3

## 2015-05-04 MED ORDER — IOHEXOL 300 MG/ML  SOLN
75.0000 mL | Freq: Once | INTRAMUSCULAR | Status: AC | PRN
  Administered 2015-05-04: 75 mL via INTRAVENOUS

## 2015-05-04 MED ORDER — IPRATROPIUM-ALBUTEROL 0.5-2.5 (3) MG/3ML IN SOLN
3.0000 mL | Freq: Four times a day (QID) | RESPIRATORY_TRACT | Status: DC
  Administered 2015-05-04 – 2015-05-06 (×7): 3 mL via RESPIRATORY_TRACT
  Filled 2015-05-04 (×8): qty 3

## 2015-05-04 MED ORDER — PHENYTOIN 50 MG PO CHEW
50.0000 mg | CHEWABLE_TABLET | Freq: Every day | ORAL | Status: DC
  Administered 2015-05-04 – 2015-05-08 (×5): 50 mg via ORAL
  Filled 2015-05-04 (×9): qty 1

## 2015-05-04 MED ORDER — DEXTROSE 5 % IV SOLN
INTRAVENOUS | Status: AC
  Filled 2015-05-04: qty 10

## 2015-05-04 MED ORDER — ASPIRIN EC 81 MG PO TBEC
81.0000 mg | DELAYED_RELEASE_TABLET | Freq: Every day | ORAL | Status: DC
  Administered 2015-05-04 – 2015-05-09 (×6): 81 mg via ORAL
  Filled 2015-05-04 (×6): qty 1

## 2015-05-04 MED ORDER — LEVETIRACETAM 750 MG PO TABS
1500.0000 mg | ORAL_TABLET | Freq: Two times a day (BID) | ORAL | Status: DC
  Administered 2015-05-04 – 2015-05-09 (×11): 1500 mg via ORAL
  Filled 2015-05-04 (×12): qty 2

## 2015-05-04 MED ORDER — ATORVASTATIN CALCIUM 80 MG PO TABS
80.0000 mg | ORAL_TABLET | Freq: Every day | ORAL | Status: DC
  Administered 2015-05-04 – 2015-05-08 (×5): 80 mg via ORAL
  Filled 2015-05-04 (×5): qty 1

## 2015-05-04 MED ORDER — PHENYTOIN SODIUM EXTENDED 30 MG PO CAPS: 350.0000 mg | ORAL_CAPSULE | Freq: Every day | ORAL | Status: DC

## 2015-05-04 MED ORDER — ASPIRIN 81 MG PO CHEW
324.0000 mg | CHEWABLE_TABLET | Freq: Once | ORAL | Status: AC
  Administered 2015-05-04: 324 mg via ORAL
  Filled 2015-05-04: qty 4

## 2015-05-04 NOTE — H&P (Signed)
[facility] History and Physical  Ladamien Rammel GYI:[id number] DOB: [birth year] DOA: 05/03/2015  Referring physician: Dr.Horton. PCP: Sheela Stack, MD  Specialists: Dr.Jaffe. Neurologist.  Chief Complaint: Seizures.  HPI: Cristian Moss is a 70 y.o. male with history of seizure, hypertension and COPD was brought to the ER to patient's wife enters the patient had a 5 minutes of seizures. Patient had a seizure where patient was having staring spells. Weight loss of almost 5 minutes. Dilantin levels were within therapeutic limits in the ER. Patient states he has been taking Keppra and Dilantin as advised. Patient also stated that he has been having chest pain and shortness of breath which has been progressively worsening over the last few months. Chest pain happens only on exertion. Retrosternal nonradiating. Along with the patient also has shortness of breath and has chronic cough. In the ER patient was mildly febrile and chest x-ray shows infiltrate concerning for pneumonia. Patient also states he has diarrhea for last 2 days multiple episodes dark colored. Denies taking any NSAIDs. Patient has been admitted for pneumonia chest pain and diarrhea. Patient's troponin is mildly elevated. EKG shows nonspecific changes.   Review of Systems: As presented in the history of presenting illness, rest negative.  Past Medical History  Diagnosis Date  . Hypertension   . Seizures ([place])   . COPD (chronic obstructive pulmonary disease) ([place])   . Thrombocytopenia ([place])   . 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 BPH (benign prostatic hypertrophy)   . Erectile dysfunction   . Empty sella syndrome ([place])     History of  . Chronic kidney disease   . Hyponatremia     history of   Past Surgical History  Procedure Laterality Date  . 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  He has a 47 pack-year smoking history. He has never used smokeless tobacco. He  reports that he does not drink alcohol or use illicit drugs. Where does patient live home. Can patient participate in ADLs? Yes.  No Known Allergies  Family History:  Family History  Problem Relation Age of Onset  . Asthma Daughter       Prior to Admission medications   Medication Sig Start Date End Date Taking? Authorizing Provider  bromocriptine (PARLODEL) 2.5 MG tablet Take 2.5 mg by mouth 2 (two) times daily.    Yes Historical Provider, MD  levETIRAcetam (KEPPRA) 750 MG tablet Take 1,500 mg by mouth every 12 (twelve) hours.   Yes Historical Provider, MD  losartan (COZAAR) 50 MG tablet Take 50 mg by mouth daily. 04/14/15  Yes Historical Provider, MD  phenytoin (DILANTIN) 100 MG ER capsule Take 3 capsules (300 mg total) by mouth at bedtime. 03/16/15  Yes Pieter Partridge, DO  lacosamide (VIMPAT) 50 MG TABS tablet Take 1 tablet (50 mg total) by mouth 2 (two) times daily. Patient not taking: Reported on 05/03/2015 03/31/14   Pieter Partridge, DO    Physical Exam: Filed Vitals:   05/03/15 2251 05/04/15 0300 05/04/15 0458  BP: 105/76 131/79 154/87  Pulse: 113 95 101  Temp: 99.5 F (37.5 C)  98.1 F (36.7 C)  TempSrc: Oral  Oral  Resp: '16 23 22  '$ Height:   '5\' 10"'$  (1.778 m)  Weight:   78.019 kg (172 lb)  SpO2: 94% 91% 97%     General:  Moderately built and nourished.  Eyes: Anicteric no pallor.  ENT: No discharge from the  ears eyes nose or mouth.  Neck: No mass felt.  Cardiovascular: S1 and S2 heard.  Respiratory: No rhonchi or crepitations.  Abdomen: Soft nontender bowel sounds present.  Skin: No rash.  Musculoskeletal: No edema.  Psychiatric: Appears normal.  Neurologic: Alert awake oriented to time place and person. Moves all extremities.  Labs on Admission:  Basic Metabolic Panel:  Recent Labs Lab 05/03/15 2359  NA 142  K 4.1  CL 107  CO2 23  GLUCOSE 101*  BUN 24*  CREATININE 1.23  CALCIUM 9.1   Liver Function Tests:  Recent Labs Lab 05/03/15 2359   AST 28  ALT 16*  ALKPHOS 113  BILITOT 0.4  PROT 7.4  ALBUMIN 3.2*   No results for input(s): LIPASE, AMYLASE in the last 168 hours. No results for input(s): AMMONIA in the last 168 hours. CBC:  Recent Labs Lab 05/03/15 2359  WBC 10.8*  NEUTROABS 8.1*  HGB 10.4*  HCT 32.5*  MCV 91.8  PLT 237   Cardiac Enzymes:  Recent Labs Lab 05/03/15 2359  TROPONINI 0.13*    BNP (last 3 results) No results for input(s): BNP in the last 8760 hours.  ProBNP (last 3 results) No results for input(s): PROBNP in the last 8760 hours.  CBG:  Recent Labs Lab 05/04/15 0011  GLUCAP 101*    Radiological Exams on Admission: Dg Chest 2 View  05/04/2015  CLINICAL DATA:  Acute onset of shortness of breath. Recent fall. Initial encounter. EXAM: CHEST  2 VIEW COMPARISON:  Chest radiograph performed 05/15/2011 FINDINGS: Dense right upper lobe airspace opacification is compatible with pneumonia. The left lung appears clear. No pleural effusion or pneumothorax is seen. The heart remains normal in size. No acute osseous abnormalities are identified. IMPRESSION: Dense right upper lobe airspace opacification, compatible with pneumonia. Followup PA and lateral chest X-ray is recommended in 3-4 weeks following trial of antibiotic therapy to ensure resolution and exclude underlying malignancy. Electronically Signed   By: Garald Balding M.D.   On: 05/04/2015 00:24    EKG: Independently reviewed. Normal sinus rhythm with nonspecific ST-T changes.  Assessment/Plan Principal Problem:   CAP (community acquired pneumonia) Active Problems:   Essential hypertension   Seizure ([place])   Elevated troponin   Diarrhea   Normocytic anemia   Pneumonia   1. Pneumonia - patient has been placed on ceftriaxone and Zithromax for community acquired pneumonia. Follow urine Legionella strep antigen influenza PCR and HIV. 2. Chest pain with elevated troponin - given the exertional symptoms we will cycle cardiac markers  to rule out ACS. Patient did receive aspirin but since patient is complaining of dark colored diarrhea and patient is anemia at this time I have ordered stool focal blood before giving further doses of aspirin. I have requested cardiology consult. Check 2-D echo. 3. Seizures - I have discussed with on-call neurologist Dr. Silverio Decamp with this time is advised patient to be transferred to [facility] for further neurology consult and recommendations. Patient agreeable to transfer. Dr. Blaine Hamper will be the accepting physician. 4. Diarrhea - patient states he has dark-colored diarrhea. Check stool for occult blood follow CBC. Check GI pathogen panel. Patient denies taking any recent antibiotics. Denies any abdominal pain. 5. Anemia - see #4. Follow CBC. Check stool for occult blood. 6. COPD - presently not wheezing. 7. Hypertension - continue home medications.  Patient will be transferred to [facility] at the request of neurologist. Please reconsult neurologist on arrival at [facility].   DVT ProphylaxisSCDs.  Code Status: Full code.  Family Communication: Patient's wife at the bedside.  Disposition Plan: Admit to inpatient.    Deanna Wiater N. [facility] Pager [phone].  If 7PM-7AM, please contact night-coverage [email or web address] Password [facility] 05/04/2015, 6:16 AM

## 2015-05-04 NOTE — Progress Notes (Signed)
PT Cancellation Note  Patient Details Name: Cristian Moss MRN: [id number] DOB: [birth year]   Cancelled Treatment:    Reason Eval/Treat Not Completed: Fatigue/lethargy limiting ability to participate (per RN, has had busy and tiring day. Is not transfering to [facility].)will check  Back  05/05/15.   Claretha Cooper 05/04/2015, 3:41 PM Tresa Endo PT [phone]

## 2015-05-04 NOTE — Care Management Note (Signed)
Case Management Note  Patient Details  Name: Cristian Moss MRN: [id number] Date of Birth: 1945/04/22  Subjective/Objective: 70 y/o m admitted w/PNA, seizures.From home. Noted for transfer to [facility] cons-seizures w/u.   Provided w/help @ home PNA-emmi program-patient will let CM kow if he wants to participate.               Action/Plan:d/c plan home.   Expected Discharge Date:                 Expected Discharge Plan:  Home/Self Care  In-House Referral:     Discharge planning Services  CM Consult  Post Acute Care Choice:    Choice offered to:     DME Arranged:    DME Agency:     HH Arranged:    HH Agency:     Status of Service:  In process, will continue to follow  Medicare Important Message Given:    Date Medicare IM Given:    Medicare IM give by:    Date Additional Medicare IM Given:    Additional Medicare Important Message give by:     If discussed at [place] of Stay Meetings, dates discussed:    Additional Comments:  Dessa Phi, RN 05/04/2015, 10:52 AM

## 2015-05-04 NOTE — ED Notes (Signed)
First blood culture obtained before the start of the antibiotics during downtime.

## 2015-05-04 NOTE — Progress Notes (Signed)
Echocardiogram 2D Echocardiogram has been performed.  Joelene Millin 05/04/2015, 10:38 AM

## 2015-05-04 NOTE — Progress Notes (Signed)
C.diff and flu PCR negative. Contact precautions discontinued  Cristian Moss. Brigitte Pulse, RN

## 2015-05-04 NOTE — Progress Notes (Signed)
[facility] PROGRESS NOTE  Cristian Moss IBB:[id number] DOB: Feb 26, 1946 DOA: 05/03/2015 PCP: Sheela Stack, MD  Brief Summary  The patient is a 70 year old male with history of epilepsy, hypertension, and COPD who presented to the emergency department after his wife witnessed a five-minute staring spell concerning for seizure. In the emergency department, his Dilantin levels were therapeutic and the patient stated he had been taking his Keppra and phenytoin as directed. He also endorsed chest pain and shortness of breath which had been progressively worsening over the last few months. His chest pain was exertional, retrosternal and nonradiating. Has had a chronic cough for the last 2 months which was productive of bloody sputum. His chest x-ray demonstrated right upper lobe pneumonia and he was mildly febrile. He also endorsed some diarrhea for the last 2 days but states he is normally very constipated. His stools are very dark. His troponin was mildly elevated and his EKG demonstrated nonspecific changes. Cardiology was consulted.  Assessment/Plan  Acute hypoxic respiratory failure secondary to community-acquired pneumonia and COPD exacerbation given his progressive shortness of breath and hemoptysis -  Continue azithromycin and ceftriaxone -  Flu PCR negative -  Strep pneumonia urine antigen negative -  Legionella pending -  Blood cultures pending -  Afebrile and no prominent leukocytosis -  CT chest with IV contrast to eval for malignancy -  QuantiFERON gold -  Start solumedrol BID -  duonebs q4-6h -  Budesonide and brovana  Elevated troponin -  Appreciate cardiology assistance - I suspect he has some degree of coronary artery disease but that his elevated troponin at admission was probably demand ischemia and not acute coronary syndrome -  Echocardiogram demonstrates some wall motion abnormalities and a depressed ejection fraction -  Start daily ASA '81mg'$   -  Start high dose  statin -  Consider selective BB if wheezing improves -  A1c and Lipid panel  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 Diff negative  Seizures -  Neurology assistance given by phone:  Recommend increasing phenytoin to '350mg'$  QHS  -  Repeat phenytoin level on 2/14  Normocytic anemia -  Iron studies, B12, folate -  TSH -  Occult stool NEG -  Repeat hgb in AM  Hypertension -  Continue metoprolol and ARB  Diet:  Healthy heart Access:  PIV IVF:  off Proph:  SCDs  Code Status: full Family Communication: patient and his wife Disposition Plan: pending CT chest to exclude malignancy, improvement in breathing after starting tx for COPD,    Consultants:  Cardiology  Procedures:  CT chest  ECHO  Antibiotics:  Ceftriaxone 2/8  Azithromycin  2/8  HPI/Subjective:  Episode of severe SOB "thought I was going to die"  Denies wheezing.  Streaks of blood in sputum for last two months.  Progressive DOE and lower extremity edema with chest pains.  Fatigued and not able to get around much  Objective: Filed Vitals:   05/04/15 1325 05/04/15 1326 05/04/15 1409 05/04/15 1413  BP: 148/76     Pulse: 116     Temp: 97.7 F (36.5 C)     TempSrc: Oral     Resp: 20     Height:      Weight:      SpO2: 81% 90% 92% 92%   No intake or output data in the 24 hours ending 05/04/15 1617 Filed Weights   05/04/15 0458  Weight: 78.019 kg (172 lb)   Body mass index is 24.68 kg/(m^2).  Exam:   General:  Adult male, No acute distress  HEENT:  NCAT,  MMM  Cardiovascular:  RRR, nl S1, +gallop, 2+ pulses, warm extremities  Respiratory:  Wheezes, diminished throughout, course rhonchi, no increased WOB  Abdomen:   NABS, soft, NT/ND  MSK:   dectone and bulk, 1+ pitting bilateral LEE  Neuro:  Grossly moves all extremities  Data Reviewed: Basic Metabolic Panel:  Recent Labs Lab 05/03/15 2359 05/04/15 1050  NA 142 139  K 4.1 3.8  CL 107 109  CO2 23 22  GLUCOSE 101* 101*  BUN 24* 15  CREATININE 1.23 0.86  CALCIUM 9.1 8.3*   Liver Function Tests:  Recent Labs Lab 05/03/15 2359 05/04/15 1050  AST 28 22  ALT 16* 14*  ALKPHOS 113 108  BILITOT 0.4 0.3  PROT 7.4 7.1  ALBUMIN 3.2* 3.1*   No results for input(s): LIPASE, AMYLASE in the last 168 hours. No results for input(s): AMMONIA in the last 168 hours. CBC:  Recent Labs Lab 05/03/15 2359 05/04/15 1050  WBC 10.8* 10.7*  NEUTROABS 8.1*  --   HGB 10.4* 11.0*  HCT 32.5* 34.6*  MCV 91.8 91.5  PLT 237 230    Recent Results (from the past 240 hour(s))  C difficile quick scan w PCR reflex     Status: None   Collection Time: 05/04/15 10:00 AM  Result Value Ref Range Status   C Diff antigen NEGATIVE NEGATIVE Final   C Diff toxin NEGATIVE NEGATIVE Final   C Diff interpretation Negative for toxigenic C. difficile  Final     Studies: Dg Chest 2 View  05/04/2015  CLINICAL DATA:  Acute onset of shortness of breath. Recent fall. Initial encounter. EXAM: CHEST  2 VIEW COMPARISON:  Chest radiograph performed 05/15/2011 FINDINGS: Dense right upper lobe airspace opacification is compatible with pneumonia. The left lung appears clear. No pleural effusion or pneumothorax is seen. The heart remains normal in size. No acute osseous abnormalities are identified. IMPRESSION: Dense right upper lobe airspace opacification, compatible with pneumonia. Followup PA and lateral chest X-ray is recommended in 3-4 weeks following trial of antibiotic therapy to ensure resolution and exclude  underlying malignancy. Electronically Signed   By: Garald Balding M.D.   On: 05/04/2015 00:24   Dg Chest Port 1 View  05/04/2015  CLINICAL DATA:  Dyspnea.  Hypoxia. EXAM: PORTABLE CHEST 1 VIEW COMPARISON:  05/03/2015 FINDINGS: Cardiomediastinal silhouette is normal. Mediastinal contours appear intact. There is no evidence of pleural effusion or pneumothorax. Dense right upper lobe airspace consolidation persists. Reticular interstitial opacities involve the remainder of the right upper lobe. Osseous structures are without acute abnormality. Soft tissues are grossly normal. IMPRESSION: Persistent dense right upper lobe airspace consolidation. Differential diagnosis includes pulmonary mass, pulmonary hemorrhage or infectious or post obstructive consolidation. If patient does not present clinically as having pneumonia, cross-sectional imaging should be considered to exclude underlying malignancy. Electronically Signed   By: Fidela Salisbury M.D.   On: 05/04/2015 14:19   Dg  Abd Portable 1v  05/04/2015  CLINICAL DATA:  Abdominal distension EXAM: PORTABLE ABDOMEN - 1 VIEW COMPARISON:  07/24/2009 FINDINGS: Scattered large and small bowel gas is noted. No free air is seen. Diffuse vascular calcifications are noted. No acute bony abnormality seen. IMPRESSION: No acute abnormality noted. Electronically Signed   By: Inez Catalina M.D.   On: 05/04/2015 10:02    Scheduled Meds: . arformoterol  15 mcg Nebulization BID  . aspirin EC  81 mg Oral Daily  . atorvastatin  80 mg Oral q1800  . azithromycin  500 mg Intravenous Q24H  . bromocriptine  2.5 mg Oral BID  . budesonide (PULMICORT) nebulizer solution  0.5 mg Nebulization BID  . cefTRIAXone (ROCEPHIN)  IV  1 g Intravenous Q24H  . ipratropium-albuterol  3 mL Nebulization QID  . levETIRAcetam  1,500 mg Oral Q12H  . losartan  50 mg Oral Daily  . methylPREDNISolone (SOLU-MEDROL) injection  40 mg Intravenous BID  . metoprolol tartrate  12.5 mg Oral BID  .  phenytoin  300 mg Oral QHS   And  . phenytoin  50 mg Oral QHS   Continuous Infusions:   Principal Problem:   CAP (community acquired pneumonia) Active Problems:   Essential hypertension   Seizure ([place])   Elevated troponin   Diarrhea   Normocytic anemia   Tobacco abuse   CKD (chronic kidney disease), stage III    Time spent: 30 min    Lillyauna Jenkinson, [facility] Pager [phone]. If 7PM-7AM, please contact night-coverage at [email or web address], password [facility] 05/04/2015, 4:17 PM  LOS: 0 days

## 2015-05-04 NOTE — ED Notes (Signed)
Azithromycin ordered during downtime given. 500 mg in 250 mL '@250'$  mL/hr

## 2015-05-04 NOTE — Care Management Note (Signed)
Case Management Note  Patient Details  Name: Cristian Moss MRN: [id number] Date of Birth: [birth year]  Subjective/Objective: Qualifies for home PNA program.Patient agree to [facility]-PNA program-referral [facility].                   Action/Plan:d/c home.   Expected Discharge Date:                 Expected Discharge Plan:  Home/Self Care  In-House Referral:     Discharge planning Services  CM Consult  Post Acute Care Choice:    Choice offered to:     DME Arranged:    DME Agency:     HH Arranged:    HH Agency:     Status of Service:  In process, will continue to follow  Medicare Important Message Given:    Date Medicare IM Given:    Medicare IM give by:    Date Additional Medicare IM Given:    Additional Medicare Important Message give by:     If discussed at [place] of Stay Meetings, dates discussed:    Additional Comments:  Dessa Phi, RN 05/04/2015, 1:05 PM

## 2015-05-04 NOTE — Consult Note (Signed)
Cardiology Consult    Patient ID: Cristian Moss MRN: [id number], DOB/AGE: 1945/06/08   Admit date: 05/03/2015 Date of Consult: 05/04/2015  Primary Physician: Sheela Stack, MD Primary Cardiologist: new  Requesting Provider: Dionne Milo  Patient Profile    70 y/o ? w/o a prior cardiac hx who was admitted 2/7 with c/o sz activity, ex c/p, melena, cough/dyspnea (CAP), and has been found to have an elevated troponin.  Past Medical History   Past Medical History  Diagnosis Date  . Essential hypertension   . Seizures ([place])   . COPD (chronic obstructive pulmonary disease) ([place])   . Thrombocytopenia ([place])   . 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 BPH (benign prostatic hypertrophy)   . Erectile dysfunction   . Empty sella syndrome ([place])     History of  . CKD (chronic kidney disease), stage III   . Hyponatremia     history of  . Tobacco abuse     Past Surgical History  Procedure Laterality Date  . 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 He does have a history of hypertension, tobacco abuse, and stage   II-III chronic kidney disease. 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 This resulted in a seizure disorder for which, he has been followed by neurology and treated with seizure medications. He says he has probably 7+ seizures per year. Historically, his seizures manifest as blank staring and absence, usually lasting only a few minutes, and resolving spontaneously.   He also has a history of chronic dyspnea on exertion, dating back about 7 years. He says that at baseline, he can walk about 50 yards on flat surface prior to having to stop and rest secondary to dyspnea. He has no history of exertional chest discomfort.   Approximately 4 weeks ago, he slipped on something on his kitchen floor, causing him  to fall and strike his left chest. Ever since then, he has had constant 3/10 left chest discomfort that is not any worse with palpation, deep breathing, position changes, or exertion. He has not had to take any over-the-counter or prescribed pain medications for this discomfort. Over the past 2 weeks, he has been experiencing dark stools. These have not necessarily been loose. In fact, he has a history of constipation and says about 2 weeks ago he took some Dulcolax and it was from then forward, that he has been noticing softer and darker stools.   On Feb 7, he was sitting and watching TV when his wife called for him and he did not respond. She attended to him and he was not responding and was staring blankly. She identified this as typical for his seizure activity. This went on for about 5 minutes and then he was finally arousable. She called EMS and he was taken to [facility]. Here, he was found to be mildly anemic and his chest x-ray showed a dense right upper lobe airspace opacification compatible with pneumonia. Upon further questioning, he reported a 3 month history of productive cough and occasional hemoptysis/blood-tinged sputum. He was admitted by internal medicine and subsequent found to have mild troponin elevation of 0.13. He has had constant chest pain since admission in the character of what he previously described as what has been going on for the past 4  weeks. His ECG is unchanged. We have been asked to evaluate.   Inpatient Medications    . azithromycin  500 mg Intravenous Q24H  . bromocriptine  2.5 mg Oral BID  . cefTRIAXone (ROCEPHIN)  IV  1 g Intravenous Q24H  . azithromycin (ZITHROMAX) 500 MG IVPB      . cefTRIAXone (ROCEPHIN) IVPB 1 gram/50 mL D5W      . levETIRAcetam  1,500 mg Oral Q12H  . losartan  50 mg Oral Daily  . phenytoin  300 mg Oral QHS    Family History    Family History  Problem Relation Age of Onset  . Asthma Daughter     Social History    Social History     Social History  . Marital Status: Married    Spouse Name: N/A  . Number of Children: N/A  . Years of Education: N/A   Occupational History  . Not on file.   Social History Main Topics  . Smoking status: Current Every Day Smoker -- 1.00 packs/day for 47 years    Types: Cigarettes  . Smokeless tobacco: Never Used  . Alcohol Use: No  . Drug Use: No  . Sexual Activity: No   Other Topics Concern  . Not on file   Social History Narrative     Review of Systems    General:  No chills, fever, night sweats or weight changes.  Cardiovascular:  +++ constant chest pain x 4 wks after fall, +++ 7 yr h/o dyspnea on exertion, no edema, orthopnea, palpitations, paroxysmal nocturnal dyspnea. Dermatological: No rash, lesions/masses Respiratory: +++ productive cough w/ occas blood-tinged sputum, +++ dyspnea Urologic: No hematuria, dysuria Abdominal:   No nausea, vomiting, diarrhea, bright red blood per rectum, melena, or hematemesis Neurologic:  +++sz activity/blank stare.  No visual changes, wkns, changes in mental status. All other systems reviewed and are otherwise negative except as noted above.  Physical Exam    Blood pressure 154/87, pulse 101, temperature 98.1 F (36.7 C), temperature source Oral, resp. rate 22, height '5\' 10"'$  (1.778 m), weight 172 lb (78.019 kg), SpO2 97 %.  General: Pleasant, NAD Psych: Normal affect. Neuro: Alert and oriented X 3. Moves all extremities spontaneously. HEENT: Normal  Neck: Supple without bruits or JVD. Lungs:  Resp regular and unlabored, diminished breath sounds bilat w/ scattered rhonchi. Heart: RRR, distant, no s3, s4, or murmurs. Abdomen: Soft, non-tender, non-distended, BS + x 4.  Extremities: No clubbing, cyanosis or edema. DP/PT/Radials 1+ and equal bilaterally.  Labs    Recent Labs  05/03/15 2359  TROPONINI 0.13*   Lab Results  Component Value Date   WBC 10.8* 05/03/2015   HGB 10.4* 05/03/2015   HCT 32.5* 05/03/2015   MCV  91.8 05/03/2015   PLT 237 05/03/2015    Recent Labs Lab 05/03/15 2359  NA 142  K 4.1  CL 107  CO2 23  BUN 24*  CREATININE 1.23  CALCIUM 9.1  PROT 7.4  BILITOT 0.4  ALKPHOS 113  ALT 16*  AST 28  GLUCOSE 101*    Radiology Studies    Dg Chest 2 View  05/04/2015  CLINICAL DATA:  Acute onset of shortness of breath. Recent fall. Initial encounter. EXAM: CHEST  2 VIEW COMPARISON:  Chest radiograph performed 05/15/2011 FINDINGS: Dense right upper lobe airspace opacification is compatible with pneumonia. The left lung appears clear. No pleural effusion or pneumothorax is seen. The heart remains normal in size. No acute osseous abnormalities are identified. IMPRESSION: Dense right  upper lobe airspace opacification, compatible with pneumonia. Followup PA and lateral chest X-ray is recommended in 3-4 weeks following trial of antibiotic therapy to ensure resolution and exclude underlying malignancy. Electronically Signed   By: Garald Balding M.D.   On: 05/04/2015 00:24    ECG & Cardiac Imaging    ST, 116, no acute st/t changes.  Assessment & Plan    1.  Left-sided chest pain and troponin elevation: Patient presented to [facility] on February 7 following a seizure that last approximate 5 minutes while at home. In the emergency department, he reported chest pain and was noted to have a troponin of 0.13. ECG is nonacute. Upon further questioning today, he says that he has been having left-sided 3/10 chest discomfort that has been constant ever since a fall in his kitchen approximately 4 weeks ago. The fall was mechanical and the result of slipping on something on his floor. He denies losing consciousness. He denies any worsening of chest pain with exertion, palpitation, deep breathing, cough, or position changes. Given other issues including seizure activity, community acquired pneumonia, and melena, I recommend 2-D echocardiography to evaluate LV function as well as additional troponin evaluation.  If LV function is normal and troponin trend remains flat, we would consider noninvasive stress testing at a point where he has recovered from these other issues. In the setting of dark stools and anemia, would not add heparin or aspirin at this point. With elevated blood pressure and heart rate, there is room to add beta blocker. Check lipids.  2. Seizure disorder: Per internal medicine/neurology.  3. Melena/normocytic anemia: Patient has been having dark stools for at least 2 if not 4 weeks. Fecal occult blood testing is pending. Avoid anticoagulation for the time being. He does have normocytic anemia at this time.  4. Community acquired pneumonia: Antibiotics per internal medicine. Next  5. Essential hypertension: Blood pressure currently elevated. He is on an ARB. Add beta blocker in the setting of possible ACS as above.  6. Tobacco abuse: Cessation advised.  Signed, Murray Hodgkins, NP 05/04/2015, 9:06 AM  As above, patient seen and examined. Briefly he is a 70 year old male with past medical history of hypertension, seizure disorder, tobacco abuse, COPD for evaluation of chest pain and abnormal troponin. No prior cardiac history. The patient has chronic dyspnea on exertion. No orthopnea or PND but occasional mild pedal edema. No exertional chest pain. Over the past 3 months he describes a cough productive of bloody sputum. No fevers or chills. He was admitted with a seizure and troponin was checked and minimally elevated; he was also complaining of chest pain and cardiology asked to evaluate. Patient states that he fell striking his left side approximately 3 weeks ago.He has had intermittent pain in that area ever since.No associated symptoms. No other chest pain noted. Electrocardiogram shows sinus rhythm, left axis deviation but no significant ST changes. 1 chest pain-symptoms are most consistent with musculoskeletal pain from recent fall. An echocardiogram has been ordered.We will await  follow-up enzymes.If no clear trend up we can consider a nuclear study for risk stratification following DC. 2 cough productive of bloody sputum-management per primary care. He is being treated for pneumonia. Given tobacco abuse it may be worthwhile to proceed with CT scan of chest.Will leave to primary care. 3 hypertension-continue present medications. 4 tobacco abuse-patient counseled on discontinuing. 5 question melena-further evaluation per primary care. Kirk Ruths

## 2015-05-04 NOTE — ED Notes (Signed)
See downtime forms. Rocephin 1g and 1000 mL bolus started.

## 2015-05-04 NOTE — Progress Notes (Signed)
[facility] Progress Note Patient Name: Cristian Moss DOB: 1946-02-26 MRN: [id number]   Date of Service  05/04/2015  HPI/Events of Note  Contacted by hospitalist to review CT Chest. Left adrenal mass, RUL mass. & Lingula nodule appreciated. No pathologic mediastinal or hilar LAD on my review. RUL mass shows some air bronchograms versus necrosis with heterogenous appearance. Also has evidence of lymphangitic spread in RUL.  eICU Interventions  1. Dedicated CT Abdomen/Pelvis 2. Consider CT guided biopsy to establish diagnosis     Intervention Category Intermediate Interventions: Diagnostic test evaluation  Tera Partridge 05/04/2015, 5:33 PM

## 2015-05-05 ENCOUNTER — Inpatient Hospital Stay (HOSPITAL_COMMUNITY): Payer: PPO

## 2015-05-05 ENCOUNTER — Encounter (HOSPITAL_COMMUNITY): Payer: Self-pay | Admitting: Radiology

## 2015-05-05 DIAGNOSIS — I2510 Atherosclerotic heart disease of native coronary artery without angina pectoris: Secondary | ICD-10-CM | POA: Diagnosis present

## 2015-05-05 DIAGNOSIS — C801 Malignant (primary) neoplasm, unspecified: Secondary | ICD-10-CM | POA: Insufficient documentation

## 2015-05-05 DIAGNOSIS — R0600 Dyspnea, unspecified: Secondary | ICD-10-CM | POA: Diagnosis present

## 2015-05-05 DIAGNOSIS — J984 Other disorders of lung: Secondary | ICD-10-CM

## 2015-05-05 DIAGNOSIS — R918 Other nonspecific abnormal finding of lung field: Secondary | ICD-10-CM | POA: Clinically undetermined

## 2015-05-05 DIAGNOSIS — D649 Anemia, unspecified: Secondary | ICD-10-CM

## 2015-05-05 DIAGNOSIS — R042 Hemoptysis: Secondary | ICD-10-CM | POA: Diagnosis present

## 2015-05-05 DIAGNOSIS — E278 Other specified disorders of adrenal gland: Secondary | ICD-10-CM | POA: Clinically undetermined

## 2015-05-05 DIAGNOSIS — R198 Other specified symptoms and signs involving the digestive system and abdomen: Secondary | ICD-10-CM | POA: Diagnosis present

## 2015-05-05 HISTORY — DX: Other specified disorders of adrenal gland: E27.8

## 2015-05-05 HISTORY — DX: Atherosclerotic heart disease of native coronary artery without angina pectoris: I25.10

## 2015-05-05 LAB — TSH: TSH: 0.092 u[IU]/mL — AB (ref 0.350–4.500)

## 2015-05-05 LAB — COMPREHENSIVE METABOLIC PANEL
ALT: 10 U/L — ABNORMAL LOW (ref 17–63)
ANION GAP: 8 (ref 5–15)
AST: 21 U/L (ref 15–41)
Albumin: 2.6 g/dL — ABNORMAL LOW (ref 3.5–5.0)
Alkaline Phosphatase: 89 U/L (ref 38–126)
BILIRUBIN TOTAL: 0.3 mg/dL (ref 0.3–1.2)
BUN: 12 mg/dL (ref 6–20)
CALCIUM: 8.2 mg/dL — AB (ref 8.9–10.3)
CO2: 21 mmol/L — ABNORMAL LOW (ref 22–32)
Chloride: 105 mmol/L (ref 101–111)
Creatinine, Ser: 0.82 mg/dL (ref 0.61–1.24)
Glucose, Bld: 140 mg/dL — ABNORMAL HIGH (ref 65–99)
POTASSIUM: 4.5 mmol/L (ref 3.5–5.1)
Sodium: 134 mmol/L — ABNORMAL LOW (ref 135–145)
TOTAL PROTEIN: 6.3 g/dL — AB (ref 6.5–8.1)

## 2015-05-05 LAB — BRAIN NATRIURETIC PEPTIDE: B NATRIURETIC PEPTIDE 5: 226.8 pg/mL — AB (ref 0.0–100.0)

## 2015-05-05 LAB — CBC
HEMATOCRIT: 30.3 % — AB (ref 39.0–52.0)
Hemoglobin: 9.7 g/dL — ABNORMAL LOW (ref 13.0–17.0)
MCH: 29.1 pg (ref 26.0–34.0)
MCHC: 32 g/dL (ref 30.0–36.0)
MCV: 91 fL (ref 78.0–100.0)
Platelets: 229 10*3/uL (ref 150–400)
RBC: 3.33 MIL/uL — ABNORMAL LOW (ref 4.22–5.81)
RDW: 13.2 % (ref 11.5–15.5)
WBC: 13.6 10*3/uL — ABNORMAL HIGH (ref 4.0–10.5)

## 2015-05-05 LAB — LEGIONELLA ANTIGEN, URINE

## 2015-05-05 LAB — VITAMIN B12: VITAMIN B 12: 367 pg/mL (ref 180–914)

## 2015-05-05 LAB — IRON AND TIBC
Iron: 42 ug/dL — ABNORMAL LOW (ref 45–182)
SATURATION RATIOS: 19 % (ref 17.9–39.5)
TIBC: 224 ug/dL — ABNORMAL LOW (ref 250–450)
UIBC: 182 ug/dL

## 2015-05-05 LAB — FERRITIN: Ferritin: 252 ng/mL (ref 24–336)

## 2015-05-05 LAB — HIV ANTIBODY (ROUTINE TESTING W REFLEX): HIV SCREEN 4TH GENERATION: NONREACTIVE

## 2015-05-05 LAB — EXPECTORATED SPUTUM ASSESSMENT W GRAM STAIN, RFLX TO RESP C

## 2015-05-05 LAB — EXPECTORATED SPUTUM ASSESSMENT W REFEX TO RESP CULTURE

## 2015-05-05 LAB — LIPID PANEL
Cholesterol: 120 mg/dL (ref 0–200)
HDL: 34 mg/dL — AB (ref >40)
LDL CALC: 75 mg/dL (ref 0–99)
TRIGLYCERIDES: 55 mg/dL (ref <150)
Total CHOL/HDL Ratio: 3.5 RATIO
VLDL: 11 mg/dL (ref 0–40)

## 2015-05-05 LAB — TROPONIN I
TROPONIN I: 0.21 ng/mL — AB (ref <0.031)
TROPONIN I: 0.07 ng/mL — AB (ref <0.031)

## 2015-05-05 LAB — FOLATE: FOLATE: 4.2 ng/mL — AB (ref >5.9)

## 2015-05-05 LAB — T4, FREE: Free T4: 1.05 ng/dL (ref 0.61–1.12)

## 2015-05-05 MED ORDER — SODIUM CHLORIDE 0.9% FLUSH: 3.0000 mL | INTRAVENOUS | Status: DC | PRN

## 2015-05-05 MED ORDER — SODIUM CHLORIDE 0.9 % IV SOLN: 250.0000 mL | INTRAVENOUS | Status: DC | PRN

## 2015-05-05 MED ORDER — FOLIC ACID 1 MG PO TABS
1.0000 mg | ORAL_TABLET | Freq: Every day | ORAL | Status: DC
  Administered 2015-05-06 – 2015-05-09 (×4): 1 mg via ORAL
  Filled 2015-05-05 (×5): qty 1

## 2015-05-05 MED ORDER — DOXYCYCLINE HYCLATE 100 MG PO TABS
100.0000 mg | ORAL_TABLET | Freq: Two times a day (BID) | ORAL | Status: DC
  Administered 2015-05-05 – 2015-05-07 (×4): 100 mg via ORAL
  Filled 2015-05-05 (×5): qty 1

## 2015-05-05 MED ORDER — IOHEXOL 300 MG/ML  SOLN
100.0000 mL | Freq: Once | INTRAMUSCULAR | Status: AC | PRN
  Administered 2015-05-05: 100 mL via INTRAVENOUS

## 2015-05-05 MED ORDER — NITROGLYCERIN 0.4 MG SL SUBL
0.4000 mg | SUBLINGUAL_TABLET | SUBLINGUAL | Status: DC | PRN
  Administered 2015-05-05 – 2015-05-06 (×6): 0.4 mg via SUBLINGUAL
  Filled 2015-05-05 (×3): qty 1

## 2015-05-05 MED ORDER — HEPARIN (PORCINE) IN NACL 100-0.45 UNIT/ML-% IJ SOLN
1100.0000 [IU]/h | INTRAMUSCULAR | Status: DC
  Administered 2015-05-05: 950 [IU]/h via INTRAVENOUS
  Filled 2015-05-05: qty 250

## 2015-05-05 MED ORDER — NITROGLYCERIN 2 % TD OINT
0.5000 [in_us] | TOPICAL_OINTMENT | Freq: Four times a day (QID) | TRANSDERMAL | Status: DC
  Administered 2015-05-05 – 2015-05-08 (×13): 0.5 [in_us] via TOPICAL
  Filled 2015-05-05 (×2): qty 30

## 2015-05-05 MED ORDER — SODIUM CHLORIDE 0.9% FLUSH: 3.0000 mL | Freq: Two times a day (BID) | INTRAVENOUS | Status: DC

## 2015-05-05 MED ORDER — SODIUM CHLORIDE 0.9 % IV SOLN: INTRAVENOUS | Status: DC

## 2015-05-05 MED ORDER — IOHEXOL 300 MG/ML  SOLN
25.0000 mL | Freq: Once | INTRAMUSCULAR | Status: DC | PRN
  Administered 2015-05-05: 25 mL via ORAL
  Filled 2015-05-05: qty 30

## 2015-05-05 MED ORDER — ASPIRIN 81 MG PO CHEW
243.0000 mg | CHEWABLE_TABLET | Freq: Once | ORAL | Status: AC
  Administered 2015-05-05: 243 mg via ORAL
  Filled 2015-05-05: qty 3

## 2015-05-05 MED ORDER — FERROUS SULFATE 325 (65 FE) MG PO TABS
325.0000 mg | ORAL_TABLET | Freq: Three times a day (TID) | ORAL | Status: DC
  Administered 2015-05-06 – 2015-05-09 (×9): 325 mg via ORAL
  Filled 2015-05-05 (×10): qty 1

## 2015-05-05 NOTE — Progress Notes (Signed)
PT Cancellation Note  Patient Details Name: Cristian Moss MRN: [id number] DOB: 07/07/1945   Cancelled Treatment:    Reason Eval/Treat Not Completed: Medical issues which prohibited therapy. Will hold PT today and check back another day once pt is medically ready. Thanks.    Weston Anna, MPT Pager: [phone]

## 2015-05-05 NOTE — Progress Notes (Signed)
IR aware of request for biopsy of pulmonary or adrenal masses. Pt admitted with seizure but now with c/o chest pain, EKG and ECHO findings concerning for acute coronary event. Cardiology plans for cardiac angiogram tomorrow am. If stent placed, pt will be on dual antiplatelet therapy.  IR will follow events tomorrow. Imaging has been reviewed with Dr. Vernard Gambles and feel adrenal mass is amenable for biopsy when pt stable.  Ascencion Dike PA-C Interventional Radiology 05/05/2015 2:34 PM

## 2015-05-05 NOTE — Progress Notes (Addendum)
[facility] PROGRESS NOTE  Cristian Moss OVF:[id number] DOB: 1945-06-17 DOA: 05/03/2015 PCP: Sheela Stack, MD  Brief Summary  The patient is a 70 year old male with history of epilepsy, hypertension, and COPD who presented to the emergency department after his wife witnessed a five-minute staring spell concerning for seizure. In the emergency department, his Dilantin levels were therapeutic and the patient stated he had been taking his Keppra and phenytoin as directed. He also endorsed chest pain and shortness of breath which had been progressively worsening over the last few months. His chest pain was exertional, retrosternal and nonradiating. Has had a chronic cough for the last 2 months which was productive of bloody sputum. His chest x-ray demonstrated right upper lobe pneumonia and he was mildly febrile. He also endorsed some diarrhea for the last 2 days but states he is normally very constipated. His stools are very dark. His troponin was mildly elevated and his EKG demonstrated nonspecific changes. Cardiology was consulted.  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  Acute EKG changes concerning for possible LAD disease today with ongoing left substernal chest pressure after a bleeding to the  bathroom. Case is very complicated however he cut his the patient has not received a biopsy of his lung or adrenal mass yet. He is also having active hemoptysis. I discussed this case with the cardiologist at length and again with the family. He is a high risk for arrhythmia and acute MI which could be life-threatening, however the blood thinners and antiplatelet agents used during and after heart catheterization could dramatically increase his hemoptysis which may also be life-threatening. The radiologist is willing to potentially perform a biopsy while the patient is on antiplatelet agents, but again the risk of complication is considerably higher. If the patient does not undergo heart catheterization, he would be very high risk for a biopsy in the radiologist is not likely to pursue this. The options were discussed at length with the family and with the patient. At this juncture, we are going to start a heparin drip and transfer the patient to the stepdown unit to monitor his level of hemoptysis overnight. His CT head was negative for obvious mass or hemorrhage. He and his family are going to continue to discuss the risks and benefits of heart catheterization but at this time they are leaning towards pursuing the procedure. They understand that should he not undergo heart catheterization, the recommendation would be for him to enter hospice care.   -  Echocardiogram demonstrates some wall motion abnormalities and a depressed ejection fraction -  Continue daily ASA '81mg'$   -  Continue high dose statin -  Continue BB  -  A1c -  LDL 75, HDL 34  Probable chronic systolic heart failure -  Patient endorses a several month history  of lower extremity edema -  Continue ARB and beta blocker -  Hold diuretics due to possible heart catheterization tomorrow  Globulin gap, probably due to pneumonia versus malignancy -  Follow-up SPEP -  Hepatitis C pending -  HIV pending  Diarrhea, sounds like overflow  incontinence -  KUB:  No prominent constipation but feels full on exam -  Continue daily miralax -  C. Diff negative  Seizures -  Neurology assistance given by phone:  Recommend increasing phenytoin to '350mg'$  QHS  -  Repeat phenytoin level on 2/14  Normocytic anemia, folate level low at 4.2, vitamin B12 within normal limits. 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  Code status would need to be temporarily reversed for these procedures.   Family Communication: patient and his wife and one daughter at length Disposition Plan:  Probably heart catheterization tomorrow and possible left adrenal mass biopsy  Consultants:  Cardiology  IR  Procedures:  CT chest  ECHO  CT head  Antibiotics:  Ceftriaxone 2/8   Azithromycin  2/8 - 2/9  HPI/Subjective:  Ongoing hemoptysis. After walking to the bathroom today he developed some left chest tightness or pressure that resolved after 2 doses of sublingual nitroglycerin.  Denying worsening of shortness of breath, nausea, lightheadedness.    Objective: Filed Vitals:   05/05/15 0528 05/05/15 0742 05/05/15 0953 05/05/15 1149  BP: 106/69  111/74   Pulse: 93  91   Temp: 97.6 F (36.4 C)     TempSrc: Oral     Resp: 18     Height:      Weight:      SpO2: 100% 98%  98%    Intake/Output Summary (Last 24 hours) at 05/05/15 1317 Last data filed at 05/05/15 0547  Gross per 24 hour  Intake    540 ml  Output    600 ml  Net    -60 ml   Filed Weights   05/04/15 0458  Weight: 78.019 kg (172 lb)   Body mass  index is 24.68 kg/(m^2).  Exam:   General:  Adult male, No acute distress  HEENT:  NCAT, MMM  Cardiovascular:  RRR, nl S1, +gallop, 2+ pulses, warm extremities  Respiratory:  Diminished throughout, improved rhonchi, no focal rales, no increased WOB  Abdomen:   NABS, soft, NT/ND  MSK:   dectone and bulk, 1+ pitting bilateral LEE  Neuro:  Grossly moves all extremities  Data Reviewed: Basic Metabolic Panel:  Recent Labs Lab 05/03/15 2359 05/04/15 1050 05/05/15 0522  NA 142 139 134*  K 4.1 3.8 4.5  CL 107 109 105  CO2 23 22 21*  GLUCOSE 101* 101* 140*  BUN 24* 15 12  CREATININE 1.23 0.86 0.82  CALCIUM 9.1 8.3* 8.2*   Liver Function Tests:  Recent Labs Lab 05/03/15 2359 05/04/15 1050 05/05/15 0522  AST '28 22 21  '$ ALT 16* 14* 10*  ALKPHOS 113 108 89  BILITOT 0.4 0.3 0.3  PROT 7.4 7.1 6.3*  ALBUMIN 3.2* 3.1* 2.6*   No results for  input(s): LIPASE, AMYLASE in the last 168 hours. No results for input(s): AMMONIA in the last 168 hours. CBC:  Recent Labs Lab 05/03/15 2359 05/04/15 1050 05/05/15 0522  WBC 10.8* 10.7* 13.6*  NEUTROABS 8.1*  --   --   HGB 10.4* 11.0* 9.7*  HCT 32.5* 34.6* 30.3*  MCV 91.8 91.5 91.0  PLT 237 230 229    Recent Results (from the past 240 hour(s))  C difficile quick scan w PCR reflex     Status: None   Collection Time: 05/04/15 10:00 AM  Result Value Ref Range Status   C Diff antigen NEGATIVE NEGATIVE Final   C Diff toxin NEGATIVE NEGATIVE Final   C Diff interpretation Negative for toxigenic C. difficile  Final  Culture, sputum-assessment     Status: None   Collection Time: 05/05/15  7:47 AM  Result Value Ref Range Status   Specimen Description SPUTUM  Final   Special Requests NONE  Final   Sputum evaluation THIS SPECIMEN IS ACCEPTABLE FOR SPUTUM CULTURE  Final   Report Status 05/05/2015 FINAL  Final     Studies: Dg Chest 2 View  05/04/2015  CLINICAL DATA:  Acute onset of shortness of breath. Recent fall. Initial  encounter. EXAM: CHEST  2 VIEW COMPARISON:  Chest radiograph performed 05/15/2011 FINDINGS: Dense right upper lobe airspace opacification is compatible with pneumonia. The left lung appears clear. No pleural effusion or pneumothorax is seen. The heart remains normal in size. No acute osseous abnormalities are identified. IMPRESSION: Dense right upper lobe airspace opacification, compatible with pneumonia. Followup PA and lateral chest X-ray is recommended in 3-4 weeks following trial of antibiotic therapy to ensure resolution and exclude underlying malignancy. Electronically Signed   By: Garald Balding M.D.   On: 05/04/2015 00:24   Ct Chest W Contrast  05/04/2015  CLINICAL DATA:  Chest pain and shortness of breath which has progressively worsened over the last few months. Chest pain is exertional, retrosternal and non radiating. Chronic cough for the last 2 months with some productive bloody sputum. Chest x-ray demonstrating right upper lobe pneumonia. EXAM: CT CHEST WITH CONTRAST TECHNIQUE: Multidetector CT imaging of the chest was performed during intravenous contrast administration. CONTRAST:  48m OMNIPAQUE IOHEXOL 300 MG/ML  SOLN COMPARISON:  Chest x-ray dated 05/04/2015. Also chest CT dated 01/17/2009. FINDINGS: There is an oval mass within the inferior segment of the right upper lobe, posteriorly, with thick enhancing wall circumferentially, measuring 7 x 5.4 x 8.4 cm (AP by transverse by craniocaudal dimensions), predominantly fluid density centrally, containing small foci of air at its upper aspect. There is extensive interstitial thickening throughout the inferior aspects of the right upper lobe, centered at and just below the level of this mass. Mild atelectasis versus fibrosis at the right lung base. Oval circumscribed nodule within the lingula is not significantly changed compared to the previous study of 2010, compatible with benign granuloma. Left lung otherwise clear. Trachea and central bronchi  unremarkable. Heart size is normal. No pericardial effusion. Coronary artery calcifications noted. Scattered atherosclerotic calcifications seen along the walls of the normal-caliber thoracic aorta. No aortic aneurysm or dissection. Left adrenal mass measures at least 6.3 x 5.8 cm, with CT density measurements ranging from 15-20 Hounsfield units, incompletely imaged at the lower portion of this exam, new compared to the previous chest CT of 01/17/2009. Limited images of the upper abdomen are otherwise unremarkable. No osseous abnormality appreciated. IMPRESSION: 1. Oval mass within the inferior segment of the right upper lobe, with  thick enhancing walls circumferentially, measuring 7 x 5.4 x 8.4 cm, predominantly fluid density centrally, containing small foci of air at its upper aspect. There is extensive surrounding interstitial thickening within the right upper lobe. This may represent a necrotizing pneumonia with associated lung abscess. However, given the additional suspicious left adrenal mass (see below), this is probably more likely to represent a neoplastic lung mass with central cavitation/necrosis. The surrounding interstitial thickening within the right upper lobe could be associated edema, pneumonia or hemorrhage. 2. Large left adrenal mass measuring at least 6.3 x 5.8 cm, incompletely imaged at the lower portion of the exam, new compared to the previous chest CT of 01/17/2009. 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 3. Heart size is normal. Coronary artery calcifications noted, particularly dense at the origin of the LAD. Recommend correlation with any possible associated cardiac symptoms. These results were called by telephone at the time of interpretation on 05/04/2015 at 5:25 pm to Dr. Janece Canterbury , who verbally acknowledged these results. Electronically Signed   By: Franki Cabot M.D.   On: 05/04/2015 17:29   Ct Abdomen Pelvis W Contrast  05/05/2015  CLINICAL  DATA:  Right upper lobe lung mass. Left adrenal mass partially visualized on chest CT. EXAM: CT ABDOMEN AND PELVIS WITH CONTRAST TECHNIQUE: Multidetector CT imaging of the abdomen and pelvis was performed using the standard protocol following bolus administration of intravenous contrast. CONTRAST:  121m OMNIPAQUE IOHEXOL 300 MG/ML SOLN, 227mOMNIPAQUE IOHEXOL 300 MG/ML SOLN COMPARISON:  05/04/2015 chest CT.  01/17/2009 chest CT. FINDINGS: Lower chest: Subsegmental atelectasis in the basilar lower lobes. Hepatobiliary: There are 2 subcentimeter hypodense liver lesions, too small to characterize, the more posterior of which is stable since 01/17/2009 and benign, and the other of which was not definitely seen on the 01/17/2009 chest CT. There are a few calcified gallstones in the gallbladder measuring up to 8 mm in size, with no gallbladder wall thickening or pericholecystic fluid. No biliary ductal dilatation. Pancreas: Normal, with no mass or duct dilation. Spleen: Normal size. No mass. Adrenals/Urinary Tract: Normal right adrenal. There is a 6.2 x 6.2 x 7.5 cm left adrenal mass with heterogeneous internal hypodensity, including areas of indeterminate density up to 27 HU inferiorly within the mass. This portion of the abdomen was not included on the 01/17/2009 chest CT. No hydronephrosis. There are a few scattered subcentimeter hypodense renal lesions in both kidneys, right greater the left, too small to characterize. Normal bladder, which contains excreted IV contrast from the chest CT study from 1 day prior. Stomach/Bowel: Grossly normal stomach. Normal caliber small bowel with no small bowel wall thickening. Normal appendix. Normal large bowel with no diverticulosis, large bowel wall thickening or pericolonic fat stranding. Vascular/Lymphatic: Atherosclerotic nonaneurysmal abdominal aorta. Patent portal, splenic, hepatic and renal veins. No pathologically enlarged lymph nodes in the abdomen or pelvis.  Reproductive: Normal size prostate and seminal vesicles. Other: No pneumoperitoneum, ascites or focal fluid collection. Musculoskeletal: No aggressive appearing focal osseous lesions. Unilateral right L5 pars interarticularis defect. 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 Probable small fat containing bilateral inguinal hernias. IMPRESSION: 1. Indeterminate 7.5 cm left adrenal mass, suspicious for a left adrenal metastasis given the large size and given the right lung mass. Recommend PET-CT for further evaluation of the chest and adrenal findings. 2. Subcentimeter hypodense liver lesion, too small to characterize. Recommend follow-up CT abdomen with IV contrast in 6 months. This recommendation follows ACR consensus guidelines: Managing Incidental  Findings on Abdominal CT: White Paper of the ACR Incidental Findings Committee. J Am Coll Radiol 2010;7:754-773 3. Cholelithiasis. 4. 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 Electronically Signed   By: Ilona Sorrel M.D.   On: 05/05/2015 09:49   Dg Chest Port 1 View  05/04/2015  CLINICAL DATA:  Dyspnea.  Hypoxia. EXAM: PORTABLE CHEST 1 VIEW COMPARISON:  05/03/2015 FINDINGS: Cardiomediastinal silhouette is normal. Mediastinal contours appear intact. There is no evidence of pleural effusion or pneumothorax. Dense right upper lobe airspace consolidation persists. Reticular interstitial opacities involve the remainder of the right upper lobe. Osseous structures are without acute abnormality. Soft tissues are grossly normal. IMPRESSION: Persistent dense right upper lobe airspace consolidation. Differential diagnosis includes pulmonary mass, pulmonary hemorrhage or infectious or post obstructive consolidation. If patient does not present clinically as having pneumonia, cross-sectional imaging should be considered to exclude underlying malignancy. Electronically Signed   By: Fidela Salisbury M.D.   On: 05/04/2015 14:19   Dg Abd  Portable 1v  05/04/2015  CLINICAL DATA:  Abdominal distension EXAM: PORTABLE ABDOMEN - 1 VIEW COMPARISON:  07/24/2009 FINDINGS: Scattered large and small bowel gas is noted. No free air is seen. Diffuse vascular calcifications are noted. No acute bony abnormality seen. IMPRESSION: No acute abnormality noted. Electronically Signed   By: Inez Catalina M.D.   On: 05/04/2015 10:02    Scheduled Meds: . arformoterol  15 mcg Nebulization BID  . aspirin EC  81 mg Oral Daily  . atorvastatin  80 mg Oral q1800  . azithromycin  500 mg Intravenous Q24H  . bromocriptine  2.5 mg Oral BID  . budesonide (PULMICORT) nebulizer solution  0.5 mg Nebulization BID  . cefTRIAXone (ROCEPHIN)  IV  1 g Intravenous Q24H  . ipratropium-albuterol  3 mL Nebulization QID  . levETIRAcetam  1,500 mg Oral Q12H  . losartan  50 mg Oral Daily  . methylPREDNISolone (SOLU-MEDROL) injection  40 mg Intravenous BID  . metoprolol tartrate  12.5 mg Oral BID  . phenytoin  300 mg Oral QHS   And  . phenytoin  50 mg Oral QHS  . polyethylene glycol  17 g Oral Daily   Continuous Infusions:   Principal Problem:   CAP (community acquired pneumonia) Active Problems:   Essential hypertension   Seizure ([place])   Elevated troponin   Diarrhea   Normocytic anemia   Tobacco abuse   CKD (chronic kidney disease), stage III   Cavitating mass in right upper lung lobe   Left adrenal mass ([facility])   Hemoptysis   Coronary artery calcification seen on CAT scan    Time spent: 90 minutes in prolonged consultation with family, discussions with cardiology and interventional radiology    Janece Canterbury  [facility] Pager [phone]. If 7PM-7AM, please contact night-coverage at [email or web address], password [facility] 05/05/2015, 1:17 PM  LOS: 1 day

## 2015-05-05 NOTE — Progress Notes (Signed)
   05/05/2015   To Whom It May Concern,  Cristian Moss was admitted to [facility] on  05/03/2015 and remains hospitalized.  Please excuse his family member [name] from absences during this time.  Sincerely,    Janece Canterbury, MD [facility] [address] [place]  [address]  Ph:    [phone] Fax:  [phone]

## 2015-05-05 NOTE — Progress Notes (Signed)
ANTICOAGULATION CONSULT NOTE - Initial Consult  Pharmacy Consult for heparin Indication: chest pain/ACS  No Known Allergies  Patient Measurements: Height: '5\' 10"'$  (177.8 cm) Weight: 172 lb (78.019 kg) IBW/kg (Calculated) : 73 Heparin Dosing Weight: 78 kg  Vital Signs: Temp: 98.4 F (36.9 C) (02/09 1353) Temp Source: Oral (02/09 1353) BP: 108/73 mmHg (02/09 1403) Pulse Rate: 98 (02/09 1403)  Labs:  Recent Labs  05/03/15 2359 05/04/15 1050 05/04/15 2210 05/05/15 0522 05/05/15 1525  HGB 10.4* 11.0*  --  9.7*  --   HCT 32.5* 34.6*  --  30.3*  --   PLT 237 230  --  229  --   CREATININE 1.23 0.86  --  0.82  --   TROPONINI 0.13* 0.09* 0.21*  --  0.07*    Estimated Creatinine Clearance: 85.3 mL/min (by C-G formula based on Cr of 0.82).   Medical History: Past Medical History  Diagnosis Date  . Essential hypertension   . Seizures ([place])   . COPD (chronic obstructive pulmonary disease) ([place])   . Thrombocytopenia ([place])   . 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 BPH (benign prostatic hypertrophy)   . Erectile dysfunction   . Empty sella syndrome ([place])     History of  . CKD (chronic kidney disease), stage III   . Hyponatremia     history of  . Tobacco abuse     Assessment: Patient's a 70 y.o M presented to the ED on 2/8 with c/o seizures and chest pain. Troponin was elevated on admission but now trending down.  CTA showed  RUL mass, incidental adrenal mass and LAD calcification. Cardiology recommends to proceed with cardiac cath on 2/10 and to initiate heparin drip tonight.  Goal of Therapy:  Heparin level 0.3-0.7 units/ml (but will aim for lower end of range d/t hemoptysis) Monitor platelets by anticoagulation protocol: Yes   Plan:  - heparin drip at 950 units/hr.  Will not bolus d/t hemoptysis) - check 8 hour heparin level - monitor closely for bleeding events - f/u after cath procedure on 2/10  Cassius Cullinane P 05/05/2015,5:18 PM

## 2015-05-05 NOTE — Progress Notes (Addendum)
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 arformoterol  15 mcg Nebulization BID  . aspirin EC  81 mg Oral Daily  . atorvastatin  80 mg Oral q1800  . azithromycin  500 mg Intravenous Q24H  . bromocriptine  2.5 mg Oral BID  . budesonide (PULMICORT) nebulizer solution  0.5 mg Nebulization BID  . cefTRIAXone (ROCEPHIN)  IV  1 g Intravenous Q24H  . ipratropium-albuterol  3 mL Nebulization QID  . levETIRAcetam  1,500 mg Oral Q12H  . losartan  50 mg Oral Daily  . methylPREDNISolone (SOLU-MEDROL) injection  40 mg Intravenous BID  . metoprolol tartrate  12.5 mg Oral BID  . phenytoin  300 mg Oral QHS   And  . phenytoin  50 mg Oral QHS  . polyethylene glycol  17 g Oral Daily    Vital Signs    Filed Vitals:   05/04/15 1817 05/04/15 2123 05/05/15 0528 05/05/15 0742  BP:  118/72 106/69   Pulse:  104 93   Temp:  98 F (36.7 C) 97.6 F (36.4 C)   TempSrc:  Oral Oral   Resp:  20 18   Height:      Weight:      SpO2: 94% 100% 100% 98%    Intake/Output Summary (Last 24 hours) at 05/05/15 0940 Last data filed at 05/05/15 0547  Gross per 24 hour  Intake    780 ml  Output    600 ml  Net    180 ml   Filed Weights   05/04/15 0458  Weight: 172 lb (78.019 kg)    Physical Exam    General: Pleasant, NAD Psych: Normal affect. Neuro: Alert and oriented X 3. Moves all extremities spontaneously. HEENT: Normal Neck: Supple without bruits or JVD. Lungs: Resp regular and unlabored, diminished breath  sounds bilat w/ scattered rhonchi. Heart: RRR, distant, no s3, s4, or murmurs. Abdomen: Soft, non-tender, non-distended, BS + x 4.   Labs    CBC  Recent Labs  05/03/15 2359 05/04/15 1050 05/05/15 0522  WBC 10.8* 10.7* 13.6*  NEUTROABS 8.1*  --   --   HGB 10.4* 11.0* 9.7*  HCT 32.5* 34.6* 30.3*  MCV 91.8 91.5 91.0  PLT 237 230 761   Basic Metabolic Panel  Recent Labs  05/04/15 1050 05/05/15 0522  NA 139 134*  K 3.8 4.5  CL 109 105  CO2 22 21*  GLUCOSE 101* 140*  BUN 15 12  CREATININE 0.86 0.82  CALCIUM 8.3* 8.2*   Liver Function Tests  Recent Labs  05/04/15 1050 05/05/15 0522  AST 22 21  ALT 14* 10*  ALKPHOS 108 89  BILITOT 0.3 0.3  PROT 7.1 6.3*  ALBUMIN 3.1* 2.6*   Cardiac Enzymes  Recent Labs  05/03/15 2359 05/04/15 1050 05/04/15 2210  TROPONINI 0.13* 0.09* 0.21*   Fasting Lipid Panel  Recent Labs  05/05/15 0522  CHOL 120  HDL 34*  LDLCALC 75  TRIG 55  CHOLHDL 3.5   Thyroid Function Tests  Recent Labs  05/05/15 0522  TSH 0.092*    Telemetry    Rsr, pvc's.  Radiology    Dg Chest 2 View  05/04/2015  CLINICAL DATA:  Acute onset of shortness of breath. Recent fall. Initial encounter. EXAM: CHEST  2 VIEW COMPARISON:  Chest radiograph performed 05/15/2011 FINDINGS: Dense right upper lobe airspace opacification is compatible with pneumonia. The left lung appears clear. No pleural effusion or pneumothorax is seen. The heart remains normal in size. No acute osseous abnormalities are identified. IMPRESSION: Dense right upper lobe airspace opacification, compatible with pneumonia. Followup PA and lateral chest X-ray is recommended in 3-4 weeks following trial of antibiotic therapy to ensure resolution and exclude underlying malignancy. Electronically Signed   By: Garald Balding M.D.   On: 05/04/2015 00:24   Ct Chest W Contrast  05/04/2015  CLINICAL DATA:  Chest pain and shortness of breath which has progressively worsened over the last few  months. Chest pain is exertional, retrosternal and non radiating. Chronic cough for the last 2 months with some productive bloody sputum. Chest x-ray demonstrating right upper lobe pneumonia. EXAM: CT CHEST WITH CONTRAST TECHNIQUE: Multidetector CT imaging of the chest was performed during intravenous contrast administration. CONTRAST:  48m OMNIPAQUE IOHEXOL 300 MG/ML  SOLN COMPARISON:  Chest x-ray dated 05/04/2015. Also chest CT dated 01/17/2009. FINDINGS: There is an oval mass within the inferior segment of the right upper lobe, posteriorly, with thick enhancing wall circumferentially, measuring 7 x 5.4 x 8.4 cm (AP by transverse by craniocaudal dimensions), predominantly fluid density centrally, containing small foci of air at its upper aspect. There is extensive interstitial thickening throughout the inferior aspects of the right upper lobe, centered at and just below the level of this mass. Mild atelectasis versus fibrosis at the right lung base. Oval circumscribed nodule within the lingula is not significantly changed compared to the previous study of 2010, compatible with benign granuloma. Left lung otherwise clear. Trachea and central bronchi unremarkable. Heart size is normal. No pericardial effusion. Coronary artery calcifications noted. Scattered atherosclerotic calcifications seen along the walls of the normal-caliber thoracic aorta. No aortic aneurysm or dissection. Left adrenal mass measures at least 6.3 x 5.8 cm, with CT density measurements ranging from 15-20 Hounsfield units, incompletely imaged at the lower portion of this exam, new compared to the previous chest CT of 01/17/2009. Limited images of the upper abdomen are otherwise unremarkable. No osseous abnormality appreciated. IMPRESSION: 1. Oval mass within the inferior segment of the right upper lobe, with thick enhancing walls circumferentially, measuring 7 x 5.4 x 8.4 cm, predominantly fluid density centrally, containing small foci of air at  its upper aspect. There is extensive surrounding interstitial thickening within the right upper lobe. This may represent a necrotizing pneumonia with associated lung abscess. However, given the additional suspicious left adrenal mass (see below), this is probably more likely to represent a neoplastic lung mass with central cavitation/necrosis. The surrounding interstitial thickening within the right upper lobe could be associated edema, pneumonia or hemorrhage. 2. Large left adrenal mass measuring at least 6.3 x 5.8 cm, incompletely imaged at the lower portion of the exam, new compared to the previous chest CT of 01/17/2009. 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 3. Heart size is normal. Coronary artery calcifications noted, particularly dense at the origin of the LAD. Recommend correlation with any possible associated cardiac symptoms. These results were called by  telephone at the time of interpretation on 05/04/2015 at 5:25 pm to Dr. Janece Canterbury , who verbally acknowledged these results. Electronically Signed   By: Franki Cabot M.D.   On: 05/04/2015 17:29   Dg Chest Port 1 View  05/04/2015  CLINICAL DATA:  Dyspnea.  Hypoxia. EXAM: PORTABLE CHEST 1 VIEW COMPARISON:  05/03/2015 FINDINGS: Cardiomediastinal silhouette is normal. Mediastinal contours appear intact. There is no evidence of pleural effusion or pneumothorax. Dense right upper lobe airspace consolidation persists. Reticular interstitial opacities involve the remainder of the right upper lobe. Osseous structures are without acute abnormality. Soft tissues are grossly normal. IMPRESSION: Persistent dense right upper lobe airspace consolidation. Differential diagnosis includes pulmonary mass, pulmonary hemorrhage or infectious or post obstructive consolidation. If patient does not present clinically as having pneumonia, cross-sectional imaging should be considered to exclude underlying malignancy. Electronically Signed    By: Fidela Salisbury M.D.   On: 05/04/2015 14:19   Dg Abd Portable 1v  05/04/2015  CLINICAL DATA:  Abdominal distension EXAM: PORTABLE ABDOMEN - 1 VIEW COMPARISON:  07/24/2009 FINDINGS: Scattered large and small bowel gas is noted. No free air is seen. Diffuse vascular calcifications are noted. No acute bony abnormality seen. IMPRESSION: No acute abnormality noted. Electronically Signed   By: Inez Catalina M.D.   On: 05/04/2015 10:02   2D Echocardiogram 2.8.2017  Study Conclusions  - Left ventricle: The cavity size was normal. Wall thickness was   normal. Systolic function was mildly reduced. The estimated   ejection fraction was in the range of 45% to 50%. There is   hypokinesis of the apicalanteroseptal myocardium. - Right ventricle: Systolic function was mildly reduced. - Pulmonary arteries: Systolic pressure was moderately increased.   PA peak pressure: 54 mm Hg (S).  Assessment & Plan    1. Left-sided chest pain and troponin elevation: Patient presented to [facility] on February 7 following a seizure that lasted approximately 5 minutes while at home. In the emergency department, he reported chest pain. ECG was nonacute. He said that c/p had been constant since fall onto his left side ~ 4 wks ago, and was not worse with exertion, palpation, deep breathing, coughing, or position changes. Troponin was initially 0.13 but subsequently fell to 0.09 and then rose to 0.21, thus trend is relatively flat and non-specific.  Echo showed mild LV dysfxn w/ an EF of 45-50% and apical/anteroseptal HK.  PAH present.  CTA chest notable for RUL mass, incidental adrenal mass, and also LAD calcification.   Cont asa, statin, bb.  2. RUL mass/hemoptysis/Adrenal Mass:  In setting of long h/o tobacco abuse.  Dedicated abd/pelvic CT pending this AM.  Pulmonary involved.   3.  Seizure disorder: Per internal medicine/neurology.  4. Melena/normocytic anemia: Patient has been having dark stools for at least 2  if not 4 weeks. Fecal occult blood negative.  Anemia w/u per medicine.  5. Community acquired pneumonia: Antibiotics per internal medicine.   6. Essential hypertension: BP stable on metoprolol/arb.  7. Tobacco abuse: Cessation advised.  Signed, Murray Hodgkins NP As above, patient seen and examined. Patient denies chest pain this morning. His troponin is minimally elevated. It does not appear to be consistent with an acute coronary event. Echocardiogram shows wall motion abnormality. Plan continue aspirin, metoprolol and statin.  Plan repeat electrocardiogram this morning (there appears to be TWI on telemetry). If patient indeed has coronary disease which is likely given his risk factors, he is a poor candidate at present for intervention as he  is having hemoptysis (any intervention would require anticoagulation). Would therefore treat medically for now. He needs further evaluation of his lung mass and adrenal mass which is felt likely to be malignancy. We will follow. Kirk Ruths   Addendum called to see patient for recurrent chest pain. The patient is an extremely difficult historian. He has 2 different types of chest pain. One is in the left rib area that increases with climbing stairs and lifting his arms. The other is in the substernal epigastric area that increases after eating. His electrocardiogram now shows anterior and inferior T-wave inversion which is new. It is suggestive of an LAD lesion. He is presently pain-free.   This is an extremely difficult situation. He has a lung mass and adrenal nodule concerning for malignancy. He will need full evaluation of this (including biopsy). He also is having some hemoptysis. He developed chest pain with walking to the bathroom with marked T wave changes suggestive of LAD lesion. I feel we need to proceed with cardiac catheterization +- intervention before proceeding with further W/U of lung mass. The risks and benefits were discussed and he  agrees to proceed. We will plan to proceed tomorrow. Note if intervention required he would need intravenous heparin in the cath lab and dual antiplatelet therapy afterwards. I will begin IV heparin tonight for his unstable angina. I'm hesitant to do this given his hemoptysis but the sputum is apparently blood streaked and not marked. The heparin will treat his unstable angina and will also help [place] understand whether he will tolerate anticoagulation prior to proceeding to the Cath Lab. I will arrange a noncontrast head CT to exclude intracranial hemorrhage or metastatic disease as a cause of his abnormal electrocardiogram prior to initiating heparin. Transferred to stepdown. Prognosis is extremely guarded. Continue ASA, statin and metoprolol. The above was discussed in detail with the patient, his wife and daughter. 30 additional minutes spent with patient care. Kirk Ruths

## 2015-05-05 NOTE — Progress Notes (Signed)
   05/05/2015   To Whom It May Concern,  Cristian Moss was admitted to [facility] on 05/03/2015 and remains hospitalized.  Please excuse his family member [name] from absences during this time.  Sincerely,    Janece Canterbury, MD [facility] [address], [place]  [address]  Ph:    [phone] Fax:  [phone]

## 2015-05-06 ENCOUNTER — Encounter (HOSPITAL_COMMUNITY)
Admission: EM | Disposition: A | Discharge status: Home / Self Care | Payer: Self-pay | Source: Home / Self Care | Attending: Internal Medicine

## 2015-05-06 ENCOUNTER — Encounter (HOSPITAL_COMMUNITY): Payer: Self-pay | Admitting: Cardiology

## 2015-05-06 DIAGNOSIS — R931 Abnormal findings on diagnostic imaging of heart and coronary circulation: Secondary | ICD-10-CM | POA: Clinically undetermined

## 2015-05-06 DIAGNOSIS — I10 Essential (primary) hypertension: Secondary | ICD-10-CM

## 2015-05-06 DIAGNOSIS — R569 Unspecified convulsions: Secondary | ICD-10-CM

## 2015-05-06 DIAGNOSIS — R9431 Abnormal electrocardiogram [ECG] [EKG]: Secondary | ICD-10-CM | POA: Diagnosis present

## 2015-05-06 DIAGNOSIS — J189 Pneumonia, unspecified organism: Secondary | ICD-10-CM

## 2015-05-06 DIAGNOSIS — I200 Unstable angina: Secondary | ICD-10-CM

## 2015-05-06 DIAGNOSIS — R918 Other nonspecific abnormal finding of lung field: Secondary | ICD-10-CM

## 2015-05-06 HISTORY — PX: CARDIAC CATHETERIZATION: SHX172

## 2015-05-06 HISTORY — DX: Unstable angina: I20.0

## 2015-05-06 LAB — PROTIME-INR
INR: 1.26 (ref 0.00–1.49)
PROTHROMBIN TIME: 16 s — AB (ref 11.6–15.2)

## 2015-05-06 LAB — PROTEIN ELECTROPHORESIS, SERUM
A/G Ratio: 0.8 (ref 0.7–1.7)
Albumin ELP: 2.4 g/dL — ABNORMAL LOW (ref 2.9–4.4)
Alpha-1-Globulin: 0.5 g/dL — ABNORMAL HIGH (ref 0.0–0.4)
Alpha-2-Globulin: 0.9 g/dL (ref 0.4–1.0)
Beta Globulin: 1 g/dL (ref 0.7–1.3)
GLOBULIN, TOTAL: 3.2 g/dL (ref 2.2–3.9)
Gamma Globulin: 0.9 g/dL (ref 0.4–1.8)
TOTAL PROTEIN ELP: 5.6 g/dL — AB (ref 6.0–8.5)

## 2015-05-06 LAB — CBC
HEMATOCRIT: 29.6 % — AB (ref 39.0–52.0)
HEMATOCRIT: 34.9 % — AB (ref 39.0–52.0)
Hemoglobin: 9.5 g/dL — ABNORMAL LOW (ref 13.0–17.0)
Hemoglobin: 11.3 g/dL — ABNORMAL LOW (ref 13.0–17.0)
MCH: 28.4 pg (ref 26.0–34.0)
MCH: 28.7 pg (ref 26.0–34.0)
MCHC: 32.1 g/dL (ref 30.0–36.0)
MCHC: 32.4 g/dL (ref 30.0–36.0)
MCV: 88.6 fL (ref 78.0–100.0)
MCV: 88.6 fL (ref 78.0–100.0)
PLATELETS: 222 10*3/uL (ref 150–400)
Platelets: 195 10*3/uL (ref 150–400)
RBC: 3.34 MIL/uL — AB (ref 4.22–5.81)
RBC: 3.94 MIL/uL — ABNORMAL LOW (ref 4.22–5.81)
RDW: 13.1 % (ref 11.5–15.5)
RDW: 13.4 % (ref 11.5–15.5)
WBC: 12.7 10*3/uL — AB (ref 4.0–10.5)
WBC: 13.2 10*3/uL — ABNORMAL HIGH (ref 4.0–10.5)

## 2015-05-06 LAB — HCV COMMENT:

## 2015-05-06 LAB — HEPARIN LEVEL (UNFRACTIONATED)
HEPARIN UNFRACTIONATED: 0.15 [IU]/mL — AB (ref 0.30–0.70)
Heparin Unfractionated: 1.4 IU/mL — ABNORMAL HIGH (ref 0.30–0.70)

## 2015-05-06 LAB — MRSA PCR SCREENING: MRSA BY PCR: NEGATIVE

## 2015-05-06 LAB — POCT ACTIVATED CLOTTING TIME
Activated Clotting Time: 229 seconds
Activated Clotting Time: 250 seconds
Activated Clotting Time: 358 seconds

## 2015-05-06 LAB — CREATININE, SERUM
Creatinine, Ser: 0.86 mg/dL (ref 0.61–1.24)
GFR calc Af Amer: >60 mL/min (ref >60)

## 2015-05-06 LAB — HEMOGLOBIN A1C
HEMOGLOBIN A1C: 6.1 % — AB (ref 4.8–5.6)
Mean Plasma Glucose: 128 mg/dL

## 2015-05-06 LAB — HEPATITIS C ANTIBODY (REFLEX)

## 2015-05-06 LAB — HIV ANTIBODY (ROUTINE TESTING W REFLEX): HIV SCREEN 4TH GENERATION: NONREACTIVE

## 2015-05-06 SURGERY — LEFT HEART CATH AND CORONARY ANGIOGRAPHY
Anesthesia: LOCAL

## 2015-05-06 MED ORDER — LIDOCAINE HCL (PF) 1 % IJ SOLN
INTRAMUSCULAR | Status: DC | PRN
  Administered 2015-05-06: 10:00:00

## 2015-05-06 MED ORDER — CLOPIDOGREL BISULFATE 75 MG PO TABS
75.0000 mg | ORAL_TABLET | Freq: Every day | ORAL | Status: DC
  Administered 2015-05-07 – 2015-05-09 (×3): 75 mg via ORAL
  Filled 2015-05-06 (×3): qty 1

## 2015-05-06 MED ORDER — HEPARIN SODIUM (PORCINE) 5000 UNIT/ML IJ SOLN
5000.0000 [IU] | Freq: Three times a day (TID) | INTRAMUSCULAR | Status: DC
  Administered 2015-05-07 – 2015-05-08 (×4): 5000 [IU] via SUBCUTANEOUS
  Filled 2015-05-06 (×4): qty 1

## 2015-05-06 MED ORDER — CETYLPYRIDINIUM CHLORIDE 0.05 % MT LIQD
7.0000 mL | Freq: Two times a day (BID) | OROMUCOSAL | Status: DC
  Administered 2015-05-06 – 2015-05-09 (×7): 7 mL via OROMUCOSAL

## 2015-05-06 MED ORDER — SODIUM CHLORIDE 0.9% FLUSH: 3.0000 mL | INTRAVENOUS | Status: DC | PRN

## 2015-05-06 MED ORDER — SODIUM CHLORIDE 0.9 % WEIGHT BASED INFUSION
3.0000 mL/kg/h | INTRAVENOUS | Status: DC
  Administered 2015-05-06: 3 mL/kg/h via INTRAVENOUS

## 2015-05-06 MED ORDER — OXYCODONE HCL 5 MG PO TABS
5.0000 mg | ORAL_TABLET | Freq: Four times a day (QID) | ORAL | Status: DC | PRN
  Administered 2015-05-06 – 2015-05-08 (×6): 5 mg via ORAL
  Filled 2015-05-06 (×6): qty 1

## 2015-05-06 MED ORDER — LIDOCAINE HCL (PF) 1 % IJ SOLN
INTRAMUSCULAR | Status: AC
  Filled 2015-05-06: qty 30

## 2015-05-06 MED ORDER — ACETAMINOPHEN 325 MG PO TABS
650.0000 mg | ORAL_TABLET | ORAL | Status: DC | PRN
  Administered 2015-05-06 – 2015-05-08 (×2): 650 mg via ORAL
  Filled 2015-05-06 (×2): qty 2

## 2015-05-06 MED ORDER — VERAPAMIL HCL 2.5 MG/ML IV SOLN
INTRA_ARTERIAL | Status: DC | PRN
  Administered 2015-05-06: 15 mL via INTRA_ARTERIAL

## 2015-05-06 MED ORDER — SODIUM CHLORIDE 0.9% FLUSH: 3.0000 mL | Freq: Two times a day (BID) | INTRAVENOUS | Status: DC

## 2015-05-06 MED ORDER — CLOPIDOGREL BISULFATE 300 MG PO TABS
ORAL_TABLET | ORAL | Status: DC | PRN
  Administered 2015-05-06: 600 mg via ORAL

## 2015-05-06 MED ORDER — CLOPIDOGREL BISULFATE 300 MG PO TABS
ORAL_TABLET | ORAL | Status: AC
  Filled 2015-05-06: qty 1

## 2015-05-06 MED ORDER — ASPIRIN 81 MG PO CHEW: 81.0000 mg | CHEWABLE_TABLET | ORAL | Status: DC

## 2015-05-06 MED ORDER — FENTANYL CITRATE (PF) 100 MCG/2ML IJ SOLN
INTRAMUSCULAR | Status: DC | PRN
  Administered 2015-05-06 (×3): 25 ug via INTRAVENOUS

## 2015-05-06 MED ORDER — SODIUM CHLORIDE 0.9% FLUSH
3.0000 mL | Freq: Two times a day (BID) | INTRAVENOUS | Status: DC
  Administered 2015-05-06 – 2015-05-09 (×6): 3 mL via INTRAVENOUS

## 2015-05-06 MED ORDER — NITROGLYCERIN 1 MG/10 ML FOR IR/CATH LAB
INTRA_ARTERIAL | Status: AC
  Filled 2015-05-06: qty 10

## 2015-05-06 MED ORDER — SODIUM CHLORIDE 0.9 % WEIGHT BASED INFUSION: 1.0000 mL/kg/h | INTRAVENOUS | Status: DC

## 2015-05-06 MED ORDER — ANGIOPLASTY BOOK
Freq: Once | Status: AC
  Administered 2015-05-06: 21:00:00
  Filled 2015-05-06: qty 1

## 2015-05-06 MED ORDER — HEPARIN SODIUM (PORCINE) 1000 UNIT/ML IJ SOLN
INTRAMUSCULAR | Status: AC
  Filled 2015-05-06: qty 1

## 2015-05-06 MED ORDER — MIDAZOLAM HCL 2 MG/2ML IJ SOLN
INTRAMUSCULAR | Status: AC
  Filled 2015-05-06: qty 2

## 2015-05-06 MED ORDER — SODIUM CHLORIDE 0.9 % IV SOLN: 250.0000 mL | INTRAVENOUS | Status: DC | PRN

## 2015-05-06 MED ORDER — SODIUM CHLORIDE 0.9 % WEIGHT BASED INFUSION: 3.0000 mL/kg/h | INTRAVENOUS | Status: AC

## 2015-05-06 MED ORDER — VERAPAMIL HCL 2.5 MG/ML IV SOLN
INTRAVENOUS | Status: AC
  Filled 2015-05-06: qty 2

## 2015-05-06 MED ORDER — FENTANYL CITRATE (PF) 100 MCG/2ML IJ SOLN
INTRAMUSCULAR | Status: AC
  Filled 2015-05-06: qty 2

## 2015-05-06 MED ORDER — MIDAZOLAM HCL 2 MG/2ML IJ SOLN
INTRAMUSCULAR | Status: DC | PRN
  Administered 2015-05-06: 1 mg via INTRAVENOUS

## 2015-05-06 MED ORDER — HEPARIN SODIUM (PORCINE) 1000 UNIT/ML IJ SOLN
INTRAMUSCULAR | Status: DC | PRN
  Administered 2015-05-06: 2500 [IU] via INTRAVENOUS
  Administered 2015-05-06 (×2): 4000 [IU] via INTRAVENOUS
  Administered 2015-05-06: 2000 [IU] via INTRAVENOUS

## 2015-05-06 MED ORDER — ONDANSETRON HCL 4 MG/2ML IJ SOLN: 4.0000 mg | Freq: Four times a day (QID) | INTRAMUSCULAR | Status: DC | PRN

## 2015-05-06 MED ORDER — HEPARIN (PORCINE) IN NACL 2-0.9 UNIT/ML-% IJ SOLN
INTRAMUSCULAR | Status: AC
  Filled 2015-05-06: qty 1000

## 2015-05-06 MED ORDER — IOHEXOL 350 MG/ML SOLN
INTRAVENOUS | Status: DC | PRN
  Administered 2015-05-06: 190 mL via INTRA_ARTERIAL

## 2015-05-06 SURGICAL SUPPLY — 23 items
BALLN EUPHORA RX 2.0X12 (BALLOONS) ×2
BALLN ~~LOC~~ EMERGE MR 3.25X15 (BALLOONS) ×2
BALLN ~~LOC~~ EMERGE MR 3.5X12 (BALLOONS) ×2
BALLOON EUPHORA RX 2.0X12 (BALLOONS) IMPLANT
BALLOON ~~LOC~~ EMERGE MR 3.25X15 (BALLOONS) IMPLANT
BALLOON ~~LOC~~ EMERGE MR 3.5X12 (BALLOONS) IMPLANT
CATH INFINITI JR4 5F (CATHETERS) ×1 IMPLANT
CATH LAUNCHER 5F RADR (CATHETERS) IMPLANT
CATH OPTITORQUE TIG 4.0 5F (CATHETERS) ×2 IMPLANT
CATH VISTA GUIDE 6FR AR1 SH (CATHETERS) ×1 IMPLANT
CATHETER LAUNCHER 5F RADR (CATHETERS) ×2
DEVICE RAD COMP TR BAND LRG (VASCULAR PRODUCTS) ×2 IMPLANT
GLIDESHEATH SLEND A-KIT 6F 22G (SHEATH) ×2 IMPLANT
GUIDE CATH RUNWAY 6FR AR1 (CATHETERS) IMPLANT
KIT ENCORE 26 ADVANTAGE (KITS) ×1 IMPLANT
KIT HEART LEFT (KITS) ×2 IMPLANT
PACK CARDIAC CATHETERIZATION (CUSTOM PROCEDURE TRAY) ×2 IMPLANT
STENT SYNERGY DES 2.75X32 (Permanent Stent) ×1 IMPLANT
TRANSDUCER W/STOPCOCK (MISCELLANEOUS) ×2 IMPLANT
TUBING CIL FLEX 10 FLL-RA (TUBING) ×2 IMPLANT
WIRE ASAHI PROWATER 180CM (WIRE) ×2 IMPLANT
WIRE HI TORQ VERSACORE-J 145CM (WIRE) ×1 IMPLANT
WIRE SAFE-T 1.5MM-J .035X260CM (WIRE) ×2 IMPLANT

## 2015-05-06 NOTE — Progress Notes (Signed)
PATIENT DETAILS Name: Cristian Moss Age: 70 y.o. Sex: male Date of Birth: 09-18-1945 Admit Date: 05/03/2015 Admitting Physician Rise Patience, MD NWG:NFAOZ,HYQMVHQ Antony Haste, MD  Brief Summary: 70 year old male with history of epilepsy, hypertension, and COPD who presented to the emergency department after his wife witnessed a five-minute staring spell concerning for seizure.He also endorsed chest pain and shortness of breath which had been progressively worsening over the last few months. Chest x-ray on admission showed a right upper lobe mass, CT chest on 2/8 confirmed a right lung mass which was felt likely to be malignancy. Unfortunately, hospital course was complicated by worsening substernal pressure, with worsening T wave inversion. Patient was seen by cardiology, transferred to [facility], and subsequently underwent a PCI on 2/10. Currently on dual antiplatelet agents, and we will need to discuss with cardiology regarding timing of biopsy to diagnose underlying malignancy.   Subjective: Seen in [facility] [place]-had just come back from the cath lab. Not sure if he has any further chest pain at this time. Since comfortable.  Assessment/Plan: Acute hypoxic respiratory failure: Secondary to COPD exacerbation, lung mass-? Postobstructive pneumonia. Continue bronchodilators, steroids and empiric doxycycline. Attempt to taper/titrate down oxygen as tolerated. Influenza PCR negative, blood/sputum cultures negative so far, urine negative for Streptococcus/Legionella antigen.  COPD exacerbation: Taper steroids further, continue bronchodilators and empiric antibiotics. Lungs clear on exam without any rhonchi. Follow.  Non-STEMI: Upon hospitalization-continued to have chest pain and worsening T wave inversions. Seen by cardiology, given severity of chest pain/EKG changes-it was felt that patient required cardiac catheterization with intervention before proceeding with workup of the lung  mass. Patient was subsequently transferred to [facility] and underwent PCI to RCA on 2/10. Continue aspirin, Plavix, statin and metoprolol. Await further input from cardiology-particularly in relation to optimal timing of lung or adrenal biopsy.  Chronic systolic heart failure: Clinically compensated, echocardiogram demonstrated mildly reduced EF of around 45%. Continue metoprolol and losartan., start diuretics if volume overloaded.  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 Difficult situation-have discussed this with family or understanding.  Breakthrough seizure-history of seizure disorder: Patient presented with suspected breakthrough seizures-Dr. short spoke with neurology over the phone, recommendations were to increase Dilantin to 350 mg.Recheck Dilantin on 2/14. Continue Keppra 1500 mg twice a day.  Hypertension: Currently controlled, continue metoprolol and losartan. Follow.  Tobacco abuse: Counseled.  Disposition: Remain inpatient  Antimicrobial agents  See below  Anti-infectives    Start     Dose/Rate Route Frequency Ordered Stop   05/05/15 2200  doxycycline (VIBRA-TABS) tablet 100 mg     100 mg Oral Every 12 hours 05/05/15 1828     05/04/15 2359  azithromycin (ZITHROMAX) 500 mg in dextrose 5 % 250 mL IVPB  Status:  Discontinued     500 mg 250 mL/hr over 60 Minutes Intravenous Every 24 hours 05/04/15 0616 05/05/15 1354   05/04/15 2200  cefTRIAXone (ROCEPHIN) 1 g in dextrose 5 % 50 mL IVPB  Status:  Discontinued     1 g 100 mL/hr over 30 Minutes Intravenous Every 24 hours 05/04/15 0616 05/05/15 1317   05/04/15 0242  dextrose 5 % with azithromycin (ZITHROMAX) ADS Med    Comments:  Rayetta Pigg   : cabinet override      05/04/15 0242 05/04/15 1444   05/04/15 0156  dextrose  5 % with cefTRIAXone (ROCEPHIN) ADS Med    Comments:  Rayetta Pigg   :  cabinet override      05/04/15 0156 05/04/15 1359      DVT Prophylaxis: Prophylactic Heparin   Code Status: Full code  Family Communication Daughter/Spouse at bedside  Procedures: None  CONSULTS:  cardiology and IR  Time spent 45 minutes-Greater than 50% of this time was spent in counseling, explanation of diagnosis, planning of further management, and coordination of care.  MEDICATIONS: Scheduled Meds: . antiseptic oral rinse  7 mL Mouth Rinse BID  . arformoterol  15 mcg Nebulization BID  . aspirin EC  81 mg Oral Daily  . atorvastatin  80 mg Oral q1800  . bromocriptine  2.5 mg Oral BID  . budesonide (PULMICORT) nebulizer solution  0.5 mg Nebulization BID  . [START ON 05/07/2015] clopidogrel  75 mg Oral Q breakfast  . doxycycline  100 mg Oral Q12H  . ferrous sulfate  325 mg Oral TID WC  . folic acid  1 mg Oral Daily  . heparin  5,000 Units Subcutaneous 3 times per day  . ipratropium-albuterol  3 mL Nebulization QID  . levETIRAcetam  1,500 mg Oral Q12H  . losartan  50 mg Oral Daily  . methylPREDNISolone (SOLU-MEDROL) injection  40 mg Intravenous BID  . metoprolol tartrate  12.5 mg Oral BID  . nitroGLYCERIN  0.5 inch Topical 4 times per day  . phenytoin  300 mg Oral QHS   And  . phenytoin  50 mg Oral QHS  . polyethylene glycol  17 g Oral Daily  . sodium chloride flush  3 mL Intravenous Q12H   Continuous Infusions:  PRN Meds:.sodium chloride, acetaminophen, nitroGLYCERIN, ondansetron (ZOFRAN) IV, oxyCODONE, sodium chloride flush    PHYSICAL EXAM: Vital signs in last 24 hours: Filed Vitals:   05/06/15 0915 05/06/15 0920 05/06/15 1100 05/06/15 1300  BP: 136/82 143/78    Pulse: 77 68 88   Temp:   97.7 F (36.5 C) 97.9 F (36.6 C)  TempSrc:   Oral Oral  Resp: 13 14    Height:      Weight:      SpO2: 92% 97% 98%     Weight change:  Filed Weights   05/04/15 0458 05/06/15 0443  Weight: 78.019 kg (172 lb) 81.738 kg (180 lb 3.2 oz)   Body mass index is  25.86 kg/(m^2).   Gen Exam: Awake and alert with clear speech.  Neck: Supple, No JVD.   Chest: B/L Clear.   CVS: S1 S2 Regular, no murmurs.  Abdomen: soft, BS +, non tender, non distended.  Extremities: no edema, lower extremities warm to touch. Neurologic: Non Focal.   Skin: No Rash.   Wounds: N/A.    Intake/Output from previous day:  Intake/Output Summary (Last 24 hours) at 05/06/15 1438 Last data filed at 05/06/15 1300  Gross per 24 hour  Intake    240 ml  Output    500 ml  Net   -260 ml     LAB RESULTS: CBC  Recent Labs Lab 05/03/15 2359 05/04/15 1050 05/05/15 0522 05/06/15 0204 05/06/15 1318  WBC 10.8* 10.7* 13.6* 12.7* 13.2*  HGB 10.4* 11.0* 9.7* 9.5* 11.3*  HCT 32.5* 34.6* 30.3* 29.6* 34.9*  PLT 237 230 229 195 222  MCV 91.8 91.5 91.0 88.6 88.6  MCH 29.4 29.1 29.1 28.4 28.7  MCHC 32.0 31.8 32.0 32.1 32.4  RDW 13.3 13.3 13.2 13.1 13.4  LYMPHSABS 1.6  --   --   --   --  MONOABS 0.9  --   --   --   --   EOSABS 0.2  --   --   --   --   BASOSABS 0.0  --   --   --   --     Chemistries   Recent Labs Lab 05/03/15 2359 05/04/15 1050 05/05/15 0522 05/06/15 1318  NA 142 139 134*  --   K 4.1 3.8 4.5  --   CL 107 109 105  --   CO2 23 22 21*  --   GLUCOSE 101* 101* 140*  --   BUN 24* 15 12  --   CREATININE 1.23 0.86 0.82 0.86  CALCIUM 9.1 8.3* 8.2*  --     CBG:  Recent Labs Lab 05/04/15 0011  GLUCAP 101*    GFR Estimated Creatinine Clearance: 81.3 mL/min (by C-G formula based on Cr of 0.86).  Coagulation profile  Recent Labs Lab 05/06/15 0204  INR 1.26    Cardiac Enzymes  Recent Labs Lab 05/04/15 1050 05/04/15 2210 05/05/15 1525  TROPONINI 0.09* 0.21* 0.07*    Invalid input(s): POCBNP No results for input(s): DDIMER in the last 72 hours.  Recent Labs  05/05/15 0522  HGBA1C 6.1*    Recent Labs  05/05/15 0522  CHOL 120  HDL 34*  LDLCALC 75  TRIG 55  CHOLHDL 3.5    Recent Labs  05/05/15 0522  TSH 0.092*     Recent Labs  05/05/15 0522  VITAMINB12 367  FOLATE 4.2*  FERRITIN 252  TIBC 224*  IRON 42*   No results for input(s): LIPASE, AMYLASE in the last 72 hours.  Urine Studies No results for input(s): UHGB, CRYS in the last 72 hours.  Invalid input(s): UACOL, UAPR, USPG, UPH, UTP, UGL, UKET, UBIL, UNIT, UROB, ULEU, UEPI, UWBC, URBC, UBAC, CAST, UCOM, BILUA  MICROBIOLOGY: Recent Results (from the past 240 hour(s))  Culture, blood (Routine X 2) w Reflex to ID Panel     Status: None (Preliminary result)   Collection Time: 05/04/15 12:00 AM  Result Value Ref Range Status   Specimen Description BLOOD RIGHT ANTECUBITAL  Final   Special Requests BOTTLES DRAWN AEROBIC ONLY 5ML  Final   Culture   Final    NO GROWTH 2 DAYS Performed at [facility]    Report Status PENDING  Incomplete  Culture, blood (Routine X 2) w Reflex to ID Panel     Status: None (Preliminary result)   Collection Time: 05/04/15  2:58 AM  Result Value Ref Range Status   Specimen Description BLOOD LEFT HAND  Final   Special Requests IN PEDIATRIC BOTTLE 1ML  Final   Culture   Final    NO GROWTH 2 DAYS Performed at [facility]    Report Status PENDING  Incomplete  C difficile quick scan w PCR reflex     Status: None   Collection Time: 05/04/15 10:00 AM  Result Value Ref Range Status   C Diff antigen NEGATIVE NEGATIVE Final   C Diff toxin NEGATIVE NEGATIVE Final   C Diff interpretation Negative for toxigenic C. difficile  Final  Culture, sputum-assessment     Status: None   Collection Time: 05/05/15  7:47 AM  Result Value Ref Range Status   Specimen Description SPUTUM  Final   Special Requests NONE  Final   Sputum evaluation THIS SPECIMEN IS ACCEPTABLE FOR SPUTUM CULTURE  Final   Report Status 05/05/2015 FINAL  Final  Culture, respiratory (NON-Expectorated)  Status: None (Preliminary result)   Collection Time: 05/05/15  7:47 AM  Result Value Ref Range Status   Specimen Description SPUTUM   Final   Special Requests NONE  Final   Gram Stain   Final    FEW WBC PRESENT, PREDOMINANTLY PMN MODERATE SQUAMOUS EPITHELIAL CELLS PRESENT MODERATE GRAM POSITIVE COCCI IN PAIRS AND CHAINS FEW GRAM POSITIVE RODS Performed at [facility]    Culture PENDING  Incomplete   Report Status PENDING  Incomplete  MRSA PCR Screening     Status: None   Collection Time: 05/05/15 10:20 PM  Result Value Ref Range Status   MRSA by PCR NEGATIVE NEGATIVE Final    Comment:        The GeneXpert MRSA Assay (FDA approved for NASAL specimens only), is one component of a comprehensive MRSA colonization surveillance program. It is not intended to diagnose MRSA infection nor to guide or monitor treatment for MRSA infections.     RADIOLOGY STUDIES/RESULTS: Dg Chest 2 View  05/04/2015  CLINICAL DATA:  Acute onset of shortness of breath. Recent fall. Initial encounter. EXAM: CHEST  2 VIEW COMPARISON:  Chest radiograph performed 05/15/2011 FINDINGS: Dense right upper lobe airspace opacification is compatible with pneumonia. The left lung appears clear. No pleural effusion or pneumothorax is seen. The heart remains normal in size. No acute osseous abnormalities are identified. IMPRESSION: Dense right upper lobe airspace opacification, compatible with pneumonia. Followup PA and lateral chest X-ray is recommended in 3-4 weeks following trial of antibiotic therapy to ensure resolution and exclude underlying malignancy. Electronically Signed   By: Garald Balding M.D.   On: 05/04/2015 00:24   Ct Head Wo Contrast  05/05/2015  CLINICAL DATA:  Recent seizure, prior CT showing lung an adrenal mass, hypertension, COPD smoker EXAM: CT HEAD WITHOUT CONTRAST TECHNIQUE: Contiguous axial images were obtained from the base of the skull through the vertex without intravenous contrast. COMPARISON:  06/19/2014 FINDINGS: Generalized atrophy. Normal ventricular morphology. No midline shift or mass effect. Small vessel chronic  ischemic changes of deep cerebral white matter. No intracranial hemorrhage, mass lesion, or acute infarction. Small size of LEFT maxillary sinus with osseous wall thickening and subtotal opacification compatible chronic sinus disease, unchanged. Visualized paranasal sinuses and mastoid air cells otherwise clear. Bones unremarkable. Atherosclerotic calcifications at skullbase. IMPRESSION: Atrophy with small vessel chronic ischemic changes of deep cerebral white matter. No acute intracranial abnormalities. In light of history of mass findings in the RIGHT lung and LEFT adrenal gland, if there is persistent clinical concern for CNS metastatic disease, recommend MR imaging of the brain with and without contrast followup. Electronically Signed   By: Lavonia Dana M.D.   On: 05/05/2015 16:58   Ct Chest W Contrast  05/04/2015  CLINICAL DATA:  Chest pain and shortness of breath which has progressively worsened over the last few months. Chest pain is exertional, retrosternal and non radiating. Chronic cough for the last 2 months with some productive bloody sputum. Chest x-ray demonstrating right upper lobe pneumonia. EXAM: CT CHEST WITH CONTRAST TECHNIQUE: Multidetector CT imaging of the chest was performed during intravenous contrast administration. CONTRAST:  31m OMNIPAQUE IOHEXOL 300 MG/ML  SOLN COMPARISON:  Chest x-ray dated 05/04/2015. Also chest CT dated 01/17/2009. FINDINGS: There is an oval mass within the inferior segment of the right upper lobe, posteriorly, with thick enhancing wall circumferentially, measuring 7 x 5.4 x 8.4 cm (AP by transverse by craniocaudal dimensions), predominantly fluid density centrally, containing small foci of air at its  upper aspect. There is extensive interstitial thickening throughout the inferior aspects of the right upper lobe, centered at and just below the level of this mass. Mild atelectasis versus fibrosis at the right lung base. Oval circumscribed nodule within the lingula is  not significantly changed compared to the previous study of 2010, compatible with benign granuloma. Left lung otherwise clear. Trachea and central bronchi unremarkable. Heart size is normal. No pericardial effusion. Coronary artery calcifications noted. Scattered atherosclerotic calcifications seen along the walls of the normal-caliber thoracic aorta. No aortic aneurysm or dissection. Left adrenal mass measures at least 6.3 x 5.8 cm, with CT density measurements ranging from 15-20 Hounsfield units, incompletely imaged at the lower portion of this exam, new compared to the previous chest CT of 01/17/2009. Limited images of the upper abdomen are otherwise unremarkable. No osseous abnormality appreciated. IMPRESSION: 1. Oval mass within the inferior segment of the right upper lobe, with thick enhancing walls circumferentially, measuring 7 x 5.4 x 8.4 cm, predominantly fluid density centrally, containing small foci of air at its upper aspect. There is extensive surrounding interstitial thickening within the right upper lobe. This may represent a necrotizing pneumonia with associated lung abscess. However, given the additional suspicious left adrenal mass (see below), this is probably more likely to represent a neoplastic lung mass with central cavitation/necrosis. The surrounding interstitial thickening within the right upper lobe could be associated edema, pneumonia or hemorrhage. 2. Large left adrenal mass measuring at least 6.3 x 5.8 cm, incompletely imaged at the lower portion of the exam, new compared to the previous chest CT of 01/17/2009. 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 3. Heart size is normal. Coronary artery calcifications noted, particularly dense at the origin of the LAD. Recommend correlation with any possible associated cardiac symptoms. These results were called by telephone at the time of interpretation on 05/04/2015 at 5:25 pm to Dr. Janece Canterbury , who verbally  acknowledged these results. Electronically Signed   By: Franki Cabot M.D.   On: 05/04/2015 17:29   Ct Abdomen Pelvis W Contrast  05/05/2015  CLINICAL DATA:  Right upper lobe lung mass. Left adrenal mass partially visualized on chest CT. EXAM: CT ABDOMEN AND PELVIS WITH CONTRAST TECHNIQUE: Multidetector CT imaging of the abdomen and pelvis was performed using the standard protocol following bolus administration of intravenous contrast. CONTRAST:  164m OMNIPAQUE IOHEXOL 300 MG/ML SOLN, 27mOMNIPAQUE IOHEXOL 300 MG/ML SOLN COMPARISON:  05/04/2015 chest CT.  01/17/2009 chest CT. FINDINGS: Lower chest: Subsegmental atelectasis in the basilar lower lobes. Hepatobiliary: There are 2 subcentimeter hypodense liver lesions, too small to characterize, the more posterior of which is stable since 01/17/2009 and benign, and the other of which was not definitely seen on the 01/17/2009 chest CT. There are a few calcified gallstones in the gallbladder measuring up to 8 mm in size, with no gallbladder wall thickening or pericholecystic fluid. No biliary ductal dilatation. Pancreas: Normal, with no mass or duct dilation. Spleen: Normal size. No mass. Adrenals/Urinary Tract: Normal right adrenal. There is a 6.2 x 6.2 x 7.5 cm left adrenal mass with heterogeneous internal hypodensity, including areas of indeterminate density up to 27 HU inferiorly within the mass. This portion of the abdomen was not included on the 01/17/2009 chest CT. No hydronephrosis. There are a few scattered subcentimeter hypodense renal lesions in both kidneys, right greater the left, too small to characterize. Normal bladder, which contains excreted IV contrast from the chest CT study from 1  day prior. Stomach/Bowel: Grossly normal stomach. Normal caliber small bowel with no small bowel wall thickening. Normal appendix. Normal large bowel with no diverticulosis, large bowel wall thickening or pericolonic fat stranding. Vascular/Lymphatic: Atherosclerotic  nonaneurysmal abdominal aorta. Patent portal, splenic, hepatic and renal veins. No pathologically enlarged lymph nodes in the abdomen or pelvis. Reproductive: Normal size prostate and seminal vesicles. Other: No pneumoperitoneum, ascites or focal fluid collection. Musculoskeletal: No aggressive appearing focal osseous lesions. Unilateral right L5 pars interarticularis defect. 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 Probable small fat containing bilateral inguinal hernias. IMPRESSION: 1. Indeterminate 7.5 cm left adrenal mass, suspicious for a left adrenal metastasis given the large size and given the right lung mass. Recommend PET-CT for further evaluation of the chest and adrenal findings. 2. Subcentimeter hypodense liver lesion, too small to characterize. Recommend follow-up CT abdomen with IV contrast in 6 months. This recommendation follows ACR consensus guidelines: Managing Incidental Findings on Abdominal CT: White Paper of the ACR Incidental Findings Committee. J Am Coll Radiol 2010;7:754-773 3. Cholelithiasis. 4. 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 Electronically Signed   By: Ilona Sorrel M.D.   On: 05/05/2015 09:49   Dg Chest Port 1 View  05/04/2015  CLINICAL DATA:  Dyspnea.  Hypoxia. EXAM: PORTABLE CHEST 1 VIEW COMPARISON:  05/03/2015 FINDINGS: Cardiomediastinal silhouette is normal. Mediastinal contours appear intact. There is no evidence of pleural effusion or pneumothorax. Dense right upper lobe airspace consolidation persists. Reticular interstitial opacities involve the remainder of the right upper lobe. Osseous structures are without acute abnormality. Soft tissues are grossly normal. IMPRESSION: Persistent dense right upper lobe airspace consolidation. Differential diagnosis includes pulmonary mass, pulmonary hemorrhage or infectious or post obstructive consolidation. If patient does not present clinically as having pneumonia, cross-sectional imaging  should be considered to exclude underlying malignancy. Electronically Signed   By: Fidela Salisbury M.D.   On: 05/04/2015 14:19   Dg Abd Portable 1v  05/04/2015  CLINICAL DATA:  Abdominal distension EXAM: PORTABLE ABDOMEN - 1 VIEW COMPARISON:  07/24/2009 FINDINGS: Scattered large and small bowel gas is noted. No free air is seen. Diffuse vascular calcifications are noted. No acute bony abnormality seen. IMPRESSION: No acute abnormality noted. Electronically Signed   By: Inez Catalina M.D.   On: 05/04/2015 10:02    Oren Binet, MD  [facility] Pager:[phone]  If 7PM-7AM, please contact night-coverage [email or web address] Password [email or web address] 05/06/2015, 2:38 PM   LOS: 2 days

## 2015-05-06 NOTE — Progress Notes (Signed)
PT Cancellation Note  Patient Details Name: Cristian Moss MRN: [id number] DOB: [birth year]   Cancelled Treatment:    Reason Eval/Treat Not Completed: Patient not medically ready (Pt at cath lab).  PT will continue to follow acutely.  Joslyn Hy PT, DPT [phone] Pager: [phone] 05/06/2015, 8:56 AM

## 2015-05-06 NOTE — Progress Notes (Signed)
ANTICOAGULATION CONSULT NOTE - Follow Up Consult  Pharmacy Consult for heparin Indication: chest pain/ACS  Labs:  Recent Labs  05/03/15 2359 05/04/15 1050 05/04/15 2210 05/05/15 0522 05/05/15 1525 05/06/15 0200 05/06/15 0204  HGB 10.4* 11.0*  --  9.7*  --   --  9.5*  HCT 32.5* 34.6*  --  30.3*  --   --  29.6*  PLT 237 230  --  229  --   --  195  LABPROT  --   --   --   --   --   --  16.0*  INR  --   --   --   --   --   --  1.26  HEPARINUNFRC  --   --   --   --   --  0.15*  --   CREATININE 1.23 0.86  --  0.82  --   --   --   TROPONINI 0.13* 0.09* 0.21*  --  0.07*  --   --     Assessment: 69yo male subtherapeutic on heparin with initial dosing for CP; hemoptysis has not worsened per RN.  Goal of Therapy:  Heparin level 0.3-0.5 units/ml   Plan:  Will increase heparin gtt conservatively by 2 units/kg/hr to 1100 units/hr and check level in 6hr.  Wynona Neat, PharmD, BCPS  05/06/2015,2:44 AM

## 2015-05-06 NOTE — Progress Notes (Signed)
TR BAND REMOVAL  LOCATION:    right radial  DEFLATED PER PROTOCOL:    Yes.    TIME BAND OFF / DRESSING APPLIED:    1345   SITE UPON ARRIVAL:    Level 0  SITE AFTER BAND REMOVAL:    Level 0  CIRCULATION SENSATION AND MOVEMENT:    Within Normal Limits   Yes.    COMMENTS:   Tolerated procedure well

## 2015-05-06 NOTE — Evaluation (Signed)
Physical Therapy Evaluation Patient Details Name: Cristian Moss MRN: [id number] DOB: 1945-07-14 Today's Date: 05/06/2015   History of Present Illness  Pt is a 70 y/o M brought to the ED by his wife after having a seizure. 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  Hospital course complicated by worsening substernal pressure and pt underwent PCI to RCA on 05/06/15.  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  Clinical Impression  Pt admitted with above diagnosis. Pt currently with functional limitations due to the deficits listed below (see PT Problem List). Pt very discouraged w/ course of his health over the past few weeks and potential diagnosis of cancer.  Thus, refusing ambulation this session but willing to participate in therapeutic exercises sitting EOB.  Pt will likely not need PT follow up as it is anticipate that he will perform well once he begins to participate in therapy.  Pt will benefit from skilled PT to increase their independence and safety with mobility to allow discharge to the venue listed below.      Follow Up Recommendations No PT follow up;Supervision for mobility/OOB    Equipment Recommendations  None recommended by PT    Recommendations for Other Services       Precautions / Restrictions Precautions Precautions: Fall Restrictions Weight Bearing Restrictions: No Other Position/Activity Restrictions: maintaining Rt UE elevation s/p PCI      Mobility  Bed Mobility Overal bed mobility: Needs Assistance Bed Mobility: Supine to Sit     Supine to sit: Min guard     General bed mobility comments: Attempted to introduce log roll technique but pt refuses teaching and uses bed rail to pull up to sitting, c/o back pain.  Transfers                 General transfer comment: Pt refused sit>stand and ambulation this session.  Ambulation/Gait                 Stairs            Wheelchair Mobility    Modified Rankin (Stroke Patients Only)       Balance Overall balance assessment: Needs assistance Sitting-balance support: Single extremity supported;Feet supported Sitting balance-Leahy Scale: Good                                       Pertinent Vitals/Pain Pain Assessment: 0-10 Pain Score: 9  Pain Location: Lt mid back pain Pain Descriptors / Indicators: Aching Pain Intervention(s): Limited activity within patient's tolerance;Monitored during session;Repositioned    Home Living Family/patient expects to be discharged to:: Private residence Living Arrangements: Spouse/significant other Available Help at Discharge: Family;Available 24 hours/day Type of Home: House Home Access: Stairs to enter Entrance Stairs-Rails: Left;Right;Can reach both Entrance Stairs-Number of Steps: 2 Home Layout: Two level Home Equipment: Cane - single point      Prior Function Level of Independence: Independent         Comments: No longer driving, church arranges transportation to run errands and doctor appointments.  Ind w/ all ADLs and mobility     Hand Dominance   Dominant Hand: Right    Extremity/Trunk Assessment   Upper Extremity Assessment: Generalized weakness           Lower Extremity Assessment: Generalized weakness;RLE deficits/detail;LLE deficits/detail RLE Deficits /  Details: strength grossly 4/5 LLE Deficits / Details: strength grossly 4/5     Communication   Communication: No difficulties  Cognition Arousal/Alertness: Lethargic Behavior During Therapy: Flat affect (discouraged) Overall Cognitive Status: Within Functional Limits for tasks assessed                      General Comments General comments (skin integrity, edema, etc.): Pt very discouraged w/ course of the past few weeks and potential diagnosis of cancer.  Thus, refusing ambulation this session.  Pt had walked to bathroom w/  RN earlier in the day and RN reports initial min instability but otherwise min guard assist.    Exercises General Exercises - Lower Extremity Ankle Circles/Pumps: AROM;Both;10 reps;Seated Long Arc Quad: AROM;Both;10 reps;Seated Hip Flexion/Marching: AROM;Both;10 reps;Seated      Assessment/Plan    PT Assessment Patient needs continued PT services  PT Diagnosis Difficulty walking;Generalized weakness   PT Problem List Decreased strength;Decreased activity tolerance;Decreased mobility;Decreased safety awareness;Cardiopulmonary status limiting activity  PT Treatment Interventions DME instruction;Gait training;Stair training;Functional mobility training;Therapeutic activities;Therapeutic exercise;Neuromuscular re-education;Patient/family education;Balance training   PT Goals (Current goals can be found in the Care Plan section) Acute Rehab PT Goals Patient Stated Goal: to go walking PT Goal Formulation: With patient Time For Goal Achievement: 05/20/15 Potential to Achieve Goals: Good    Frequency Min 3X/week   Barriers to discharge Inaccessible home environment steps at home    Co-evaluation               End of Session Equipment Utilized During Treatment: Oxygen Activity Tolerance: Patient limited by fatigue Patient left: in bed;with call bell/phone within reach;with bed alarm set (sitting EOB) Nurse Communication: Mobility status;Other (comment) (pt sitting EOB)         Time: 6789-3810 PT Time Calculation (min) (ACUTE ONLY): 20 min   Charges:   PT Evaluation $PT Eval Moderate Complexity: 1 Procedure     PT G Codes:       Joslyn Hy PT, DPT [phone] Pager: [phone] 05/06/2015, 4:57 PM

## 2015-05-06 NOTE — Interval H&P Note (Signed)
History and Physical Interval Note:  05/06/2015 7:13 AM  Cristian Moss  has presented today for surgery, with the diagnosis of UNSTABLE ANGINA WITH ABNORMAL EKG & ECHOCARDIOGRAM  The various methods of treatment have been discussed with the patient and family. After consideration of risks, benefits and other options for treatment, the patient has consented to  Procedure(s): Left Heart Cath and Coronary Angiography (N/A) with POSSIBLE PERCUTANEOUS CORONARY INTERVENTION as a surgical intervention .  The patient's history has been reviewed, patient examined, no change in status, stable for surgery.  I have reviewed the patient's chart and labs.  Questions were answered to the patient's satisfaction.     Cath Lab Visit (complete for each Cath Lab visit)  Clinical Evaluation Leading to the Procedure:   ACS: Yes.    Non-ACS:    Anginal Classification: CCS IV  Anti-ischemic medical therapy: Minimal Therapy (1 class of medications)  Non-Invasive Test Results: Equivocal test results - Echocardiogram with Anterior-Aneroapical WMA consistent with LAD disease  Prior CABG: No previous CABG   TIMI SCORE  Patient Information:  TIMI Score is 3  UA/NSTEMI and intermediate-risk features (e.g., TIMI score 3?4) for short-term risk of death or nonfatal MI  Revascularization of the presumed culprit artery   A (9)  Indication: 10; Score: 9   HARDING, DAVID W

## 2015-05-06 NOTE — Plan of Care (Signed)
Problem: Safety: Goal: Ability to remain free from injury will improve Outcome: Completed/Met Date Met:  05/06/15 Patient's call bell is within reach, bed alarm on, and family at bedside. Patient is alert and oriented x4. Patient states that he will call for staff assistance before attempting to get out of bed.  Problem: Pain Managment: Goal: General experience of comfort will improve Outcome: Completed/Met Date Met:  05/06/15 Patient has had no complaints of pain. Patient was chest pain free upon arrival to the unit; nitro paste on chest. Patient will alert RN if he develops pain.   Problem: Skin Integrity: Goal: Risk for impaired skin integrity will decrease Outcome: Completed/Met Date Met:  05/06/15 Upon assessment, patient's skin is clean, dry, and intact. Patient has good mobility in the bed and is a low risk for skin breakdown per the Braden scale.

## 2015-05-06 NOTE — H&P (View-Only) (Signed)
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 arformoterol  15 mcg Nebulization BID  . aspirin EC  81 mg Oral Daily  . atorvastatin  80 mg Oral q1800  . azithromycin  500 mg Intravenous Q24H  . bromocriptine  2.5 mg Oral BID  . budesonide (PULMICORT) nebulizer solution  0.5 mg Nebulization BID  . cefTRIAXone (ROCEPHIN)  IV  1 g Intravenous Q24H  . ipratropium-albuterol  3 mL Nebulization QID  . levETIRAcetam  1,500 mg Oral Q12H  . losartan  50 mg Oral Daily  . methylPREDNISolone (SOLU-MEDROL) injection  40 mg Intravenous BID  . metoprolol tartrate  12.5 mg Oral BID  . phenytoin  300 mg Oral QHS   And  . phenytoin  50 mg Oral QHS  . polyethylene glycol  17 g Oral Daily    Vital Signs    Filed Vitals:   05/04/15 1817 05/04/15 2123 05/05/15 0528 05/05/15 0742  BP:  118/72 106/69   Pulse:  104 93   Temp:  98 F (36.7 C) 97.6 F (36.4 C)   TempSrc:  Oral Oral   Resp:  20 18   Height:      Weight:      SpO2: 94% 100% 100% 98%    Intake/Output Summary (Last 24 hours) at 05/05/15 0940 Last data filed at 05/05/15 0547  Gross per 24 hour  Intake    780 ml  Output    600 ml  Net    180 ml   Filed Weights   05/04/15 0458  Weight: 172 lb (78.019 kg)    Physical Exam    General: Pleasant, NAD Psych: Normal affect. Neuro: Alert and oriented X 3. Moves all extremities spontaneously. HEENT: Normal Neck: Supple without bruits or JVD. Lungs: Resp regular and unlabored, diminished breath  sounds bilat w/ scattered rhonchi. Heart: RRR, distant, no s3, s4, or murmurs. Abdomen: Soft, non-tender, non-distended, BS + x 4.   Labs    CBC  Recent Labs  05/03/15 2359 05/04/15 1050 05/05/15 0522  WBC 10.8* 10.7* 13.6*  NEUTROABS 8.1*  --   --   HGB 10.4* 11.0* 9.7*  HCT 32.5* 34.6* 30.3*  MCV 91.8 91.5 91.0  PLT 237 230 161   Basic Metabolic Panel  Recent Labs  05/04/15 1050 05/05/15 0522  NA 139 134*  K 3.8 4.5  CL 109 105  CO2 22 21*  GLUCOSE 101* 140*  BUN 15 12  CREATININE 0.86 0.82  CALCIUM 8.3* 8.2*   Liver Function Tests  Recent Labs  05/04/15 1050 05/05/15 0522  AST 22 21  ALT 14* 10*  ALKPHOS 108 89  BILITOT 0.3 0.3  PROT 7.1 6.3*  ALBUMIN 3.1* 2.6*   Cardiac Enzymes  Recent Labs  05/03/15 2359 05/04/15 1050 05/04/15 2210  TROPONINI 0.13* 0.09* 0.21*   Fasting Lipid Panel  Recent Labs  05/05/15 0522  CHOL 120  HDL 34*  LDLCALC 75  TRIG 55  CHOLHDL 3.5   Thyroid Function Tests  Recent Labs  05/05/15 0522  TSH 0.092*    Telemetry    Rsr, pvc's.  Radiology    Dg Chest 2 View  05/04/2015  CLINICAL DATA:  Acute onset of shortness of breath. Recent fall. Initial encounter. EXAM: CHEST  2 VIEW COMPARISON:  Chest radiograph performed 05/15/2011 FINDINGS: Dense right upper lobe airspace opacification is compatible with pneumonia. The left lung appears clear. No pleural effusion or pneumothorax is seen. The heart remains normal in size. No acute osseous abnormalities are identified. IMPRESSION: Dense right upper lobe airspace opacification, compatible with pneumonia. Followup PA and lateral chest X-ray is recommended in 3-4 weeks following trial of antibiotic therapy to ensure resolution and exclude underlying malignancy. Electronically Signed   By: Garald Balding M.D.   On: 05/04/2015 00:24   Ct Chest W Contrast  05/04/2015  CLINICAL DATA:  Chest pain and shortness of breath which has progressively worsened over the last few  months. Chest pain is exertional, retrosternal and non radiating. Chronic cough for the last 2 months with some productive bloody sputum. Chest x-ray demonstrating right upper lobe pneumonia. EXAM: CT CHEST WITH CONTRAST TECHNIQUE: Multidetector CT imaging of the chest was performed during intravenous contrast administration. CONTRAST:  70m OMNIPAQUE IOHEXOL 300 MG/ML  SOLN COMPARISON:  Chest x-ray dated 05/04/2015. Also chest CT dated 01/17/2009. FINDINGS: There is an oval mass within the inferior segment of the right upper lobe, posteriorly, with thick enhancing wall circumferentially, measuring 7 x 5.4 x 8.4 cm (AP by transverse by craniocaudal dimensions), predominantly fluid density centrally, containing small foci of air at its upper aspect. There is extensive interstitial thickening throughout the inferior aspects of the right upper lobe, centered at and just below the level of this mass. Mild atelectasis versus fibrosis at the right lung base. Oval circumscribed nodule within the lingula is not significantly changed compared to the previous study of 2010, compatible with benign granuloma. Left lung otherwise clear. Trachea and central bronchi unremarkable. Heart size is normal. No pericardial effusion. Coronary artery calcifications noted. Scattered atherosclerotic calcifications seen along the walls of the normal-caliber thoracic aorta. No aortic aneurysm or dissection. Left adrenal mass measures at least 6.3 x 5.8 cm, with CT density measurements ranging from 15-20 Hounsfield units, incompletely imaged at the lower portion of this exam, new compared to the previous chest CT of 01/17/2009. Limited images of the upper abdomen are otherwise unremarkable. No osseous abnormality appreciated. IMPRESSION: 1. Oval mass within the inferior segment of the right upper lobe, with thick enhancing walls circumferentially, measuring 7 x 5.4 x 8.4 cm, predominantly fluid density centrally, containing small foci of air at  its upper aspect. There is extensive surrounding interstitial thickening within the right upper lobe. This may represent a necrotizing pneumonia with associated lung abscess. However, given the additional suspicious left adrenal mass (see below), this is probably more likely to represent a neoplastic lung mass with central cavitation/necrosis. The surrounding interstitial thickening within the right upper lobe could be associated edema, pneumonia or hemorrhage. 2. Large left adrenal mass measuring at least 6.3 x 5.8 cm, incompletely imaged at the lower portion of the exam, new compared to the previous chest CT of 01/17/2009. 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 3. Heart size is normal. Coronary artery calcifications noted, particularly dense at the origin of the LAD. Recommend correlation with any possible associated cardiac symptoms. These results were called by  telephone at the time of interpretation on 05/04/2015 at 5:25 pm to Dr. Janece Canterbury , who verbally acknowledged these results. Electronically Signed   By: Franki Cabot M.D.   On: 05/04/2015 17:29   Dg Chest Port 1 View  05/04/2015  CLINICAL DATA:  Dyspnea.  Hypoxia. EXAM: PORTABLE CHEST 1 VIEW COMPARISON:  05/03/2015 FINDINGS: Cardiomediastinal silhouette is normal. Mediastinal contours appear intact. There is no evidence of pleural effusion or pneumothorax. Dense right upper lobe airspace consolidation persists. Reticular interstitial opacities involve the remainder of the right upper lobe. Osseous structures are without acute abnormality. Soft tissues are grossly normal. IMPRESSION: Persistent dense right upper lobe airspace consolidation. Differential diagnosis includes pulmonary mass, pulmonary hemorrhage or infectious or post obstructive consolidation. If patient does not present clinically as having pneumonia, cross-sectional imaging should be considered to exclude underlying malignancy. Electronically Signed    By: Fidela Salisbury M.D.   On: 05/04/2015 14:19   Dg Abd Portable 1v  05/04/2015  CLINICAL DATA:  Abdominal distension EXAM: PORTABLE ABDOMEN - 1 VIEW COMPARISON:  07/24/2009 FINDINGS: Scattered large and small bowel gas is noted. No free air is seen. Diffuse vascular calcifications are noted. No acute bony abnormality seen. IMPRESSION: No acute abnormality noted. Electronically Signed   By: Inez Catalina M.D.   On: 05/04/2015 10:02   2D Echocardiogram 2.8.2017  Study Conclusions  - Left ventricle: The cavity size was normal. Wall thickness was   normal. Systolic function was mildly reduced. The estimated   ejection fraction was in the range of 45% to 50%. There is   hypokinesis of the apicalanteroseptal myocardium. - Right ventricle: Systolic function was mildly reduced. - Pulmonary arteries: Systolic pressure was moderately increased.   PA peak pressure: 54 mm Hg (S).  Assessment & Plan    1. Left-sided chest pain and troponin elevation: Patient presented to [facility] on February 7 following a seizure that lasted approximately 5 minutes while at home. In the emergency department, he reported chest pain. ECG was nonacute. He said that c/p had been constant since fall onto his left side ~ 4 wks ago, and was not worse with exertion, palpation, deep breathing, coughing, or position changes. Troponin was initially 0.13 but subsequently fell to 0.09 and then rose to 0.21, thus trend is relatively flat and non-specific.  Echo showed mild LV dysfxn w/ an EF of 45-50% and apical/anteroseptal HK.  PAH present.  CTA chest notable for RUL mass, incidental adrenal mass, and also LAD calcification.   Cont asa, statin, bb.  2. RUL mass/hemoptysis/Adrenal Mass:  In setting of long h/o tobacco abuse.  Dedicated abd/pelvic CT pending this AM.  Pulmonary involved.   3.  Seizure disorder: Per internal medicine/neurology.  4. Melena/normocytic anemia: Patient has been having dark stools for at least 2  if not 4 weeks. Fecal occult blood negative.  Anemia w/u per medicine.  5. Community acquired pneumonia: Antibiotics per internal medicine.   6. Essential hypertension: BP stable on metoprolol/arb.  7. Tobacco abuse: Cessation advised.  Signed, Murray Hodgkins NP As above, patient seen and examined. Patient denies chest pain this morning. His troponin is minimally elevated. It does not appear to be consistent with an acute coronary event. Echocardiogram shows wall motion abnormality. Plan continue aspirin, metoprolol and statin.  Plan repeat electrocardiogram this morning (there appears to be TWI on telemetry). If patient indeed has coronary disease which is likely given his risk factors, he is a poor candidate at present for intervention as he  is having hemoptysis (any intervention would require anticoagulation). Would therefore treat medically for now. He needs further evaluation of his lung mass and adrenal mass which is felt likely to be malignancy. We will follow. Kirk Ruths   Addendum called to see patient for recurrent chest pain. The patient is an extremely difficult historian. He has 2 different types of chest pain. One is in the left rib area that increases with climbing stairs and lifting his arms. The other is in the substernal epigastric area that increases after eating. His electrocardiogram now shows anterior and inferior T-wave inversion which is new. It is suggestive of an LAD lesion. He is presently pain-free.   This is an extremely difficult situation. He has a lung mass and adrenal nodule concerning for malignancy. He will need full evaluation of this (including biopsy). He also is having some hemoptysis. He developed chest pain with walking to the bathroom with marked T wave changes suggestive of LAD lesion. I feel we need to proceed with cardiac catheterization +- intervention before proceeding with further W/U of lung mass. The risks and benefits were discussed and he  agrees to proceed. We will plan to proceed tomorrow. Note if intervention required he would need intravenous heparin in the cath lab and dual antiplatelet therapy afterwards. I will begin IV heparin tonight for his unstable angina. I'm hesitant to do this given his hemoptysis but the sputum is apparently blood streaked and not marked. The heparin will treat his unstable angina and will also help [place] understand whether he will tolerate anticoagulation prior to proceeding to the Cath Lab. I will arrange a noncontrast head CT to exclude intracranial hemorrhage or metastatic disease as a cause of his abnormal electrocardiogram prior to initiating heparin. Transferred to stepdown. Prognosis is extremely guarded. Continue ASA, statin and metoprolol. The above was discussed in detail with the patient, his wife and daughter. 30 additional minutes spent with patient care. Kirk Ruths

## 2015-05-07 ENCOUNTER — Other Ambulatory Visit: Payer: Self-pay

## 2015-05-07 DIAGNOSIS — I214 Non-ST elevation (NSTEMI) myocardial infarction: Secondary | ICD-10-CM | POA: Insufficient documentation

## 2015-05-07 DIAGNOSIS — I519 Heart disease, unspecified: Secondary | ICD-10-CM

## 2015-05-07 DIAGNOSIS — J984 Other disorders of lung: Secondary | ICD-10-CM

## 2015-05-07 DIAGNOSIS — I255 Ischemic cardiomyopathy: Secondary | ICD-10-CM

## 2015-05-07 DIAGNOSIS — I25110 Atherosclerotic heart disease of native coronary artery with unstable angina pectoris: Secondary | ICD-10-CM

## 2015-05-07 LAB — COMPREHENSIVE METABOLIC PANEL
ALT: 12 U/L — ABNORMAL LOW (ref 17–63)
AST: 15 U/L (ref 15–41)
Albumin: 2.2 g/dL — ABNORMAL LOW (ref 3.5–5.0)
Alkaline Phosphatase: 79 U/L (ref 38–126)
Anion gap: 8 (ref 5–15)
BUN: 13 mg/dL (ref 6–20)
CHLORIDE: 104 mmol/L (ref 101–111)
CO2: 23 mmol/L (ref 22–32)
CREATININE: 0.85 mg/dL (ref 0.61–1.24)
Calcium: 8.2 mg/dL — ABNORMAL LOW (ref 8.9–10.3)
GFR calc Af Amer: >60 mL/min (ref >60)
Glucose, Bld: 129 mg/dL — ABNORMAL HIGH (ref 65–99)
Potassium: 4.5 mmol/L (ref 3.5–5.1)
SODIUM: 135 mmol/L (ref 135–145)
TOTAL PROTEIN: 5.5 g/dL — AB (ref 6.5–8.1)
Total Bilirubin: 0.3 mg/dL (ref 0.3–1.2)

## 2015-05-07 LAB — CBC
HCT: 26.1 % — ABNORMAL LOW (ref 39.0–52.0)
Hemoglobin: 8.7 g/dL — ABNORMAL LOW (ref 13.0–17.0)
MCH: 29.7 pg (ref 26.0–34.0)
MCHC: 33.3 g/dL (ref 30.0–36.0)
MCV: 89.1 fL (ref 78.0–100.0)
PLATELETS: 205 10*3/uL (ref 150–400)
RBC: 2.93 MIL/uL — ABNORMAL LOW (ref 4.22–5.81)
RDW: 13.4 % (ref 11.5–15.5)
WBC: 12.7 10*3/uL — AB (ref 4.0–10.5)

## 2015-05-07 MED ORDER — PANTOPRAZOLE SODIUM 40 MG PO TBEC
40.0000 mg | DELAYED_RELEASE_TABLET | Freq: Every day | ORAL | Status: DC
  Administered 2015-05-07: 40 mg via ORAL
  Filled 2015-05-07: qty 1

## 2015-05-07 MED ORDER — AMOXICILLIN-POT CLAVULANATE 875-125 MG PO TABS
1.0000 | ORAL_TABLET | Freq: Two times a day (BID) | ORAL | Status: DC
  Administered 2015-05-07 – 2015-05-09 (×5): 1 via ORAL
  Filled 2015-05-07 (×7): qty 1

## 2015-05-07 MED ORDER — IPRATROPIUM-ALBUTEROL 0.5-2.5 (3) MG/3ML IN SOLN: 3.0000 mL | RESPIRATORY_TRACT | Status: DC | PRN

## 2015-05-07 MED ORDER — GI COCKTAIL ~~LOC~~
30.0000 mL | Freq: Once | ORAL | Status: AC
  Administered 2015-05-07: 30 mL via ORAL
  Filled 2015-05-07: qty 30

## 2015-05-07 NOTE — Progress Notes (Signed)
CARDIAC REHAB PHASE I   PRE:  Rate/Rhythm: 90 R    BP: sitting 131/60    SaO2: 96 2L, 96 RA  MODE:  Ambulation: 200 ft   POST:  Rate/Rhythm: 103 ST with PVC    BP: sitting 148/72     SaO2: 96 RA  Able to d/c O2. Pt ambulated with assist x1 with gait belt and pt holding to railing. Generally unsteady with staggering gait. Pt sts this is normal for him however wife, who came in after he walked, sts he gets more unsteady when his seizure meds are increased (which they were this admit). Pt apparently only walks in house. Does not use assistive device normally. I suggested RW here in hospital. He should not be up alone. Encouraged him to work with PT when they come. I was not able to do any education due to multiple family members arriving and talking amongst themselves. Will f/u Monday.  [id number]  Josephina Shih [place] CES, ACSM 05/07/2015 10:34 AM

## 2015-05-07 NOTE — Progress Notes (Signed)
Patient Name: Cristian Moss Date of Encounter: 05/07/2015   SUBJECTIVE  Resolved chest pain. SOB stable. No complains.   CURRENT MEDS . antiseptic oral rinse  7 mL Mouth Rinse BID  . arformoterol  15 mcg Nebulization BID  . aspirin EC  81 mg Oral Daily  . atorvastatin  80 mg Oral q1800  . bromocriptine  2.5 mg Oral BID  . budesonide (PULMICORT) nebulizer solution  0.5 mg Nebulization BID  . clopidogrel  75 mg Oral Q breakfast  . doxycycline  100 mg Oral Q12H  . ferrous sulfate  325 mg Oral TID WC  . folic acid  1 mg Oral Daily  . heparin  5,000 Units Subcutaneous 3 times per day  . ipratropium-albuterol  3 mL Nebulization QID  . levETIRAcetam  1,500 mg Oral Q12H  . losartan  50 mg Oral Daily  . methylPREDNISolone (SOLU-MEDROL) injection  40 mg Intravenous BID  . metoprolol tartrate  12.5 mg Oral BID  . nitroGLYCERIN  0.5 inch Topical 4 times per day  . phenytoin  300 mg Oral QHS   And  . phenytoin  50 mg Oral QHS  . polyethylene glycol  17 g Oral Daily  . sodium chloride flush  3 mL Intravenous Q12H    OBJECTIVE  Filed Vitals:   05/06/15 2200 05/07/15 0308 05/07/15 0750 05/07/15 0811  BP: 174/135 130/63  125/64  Pulse: 97 80  92  Temp:  98.2 F (36.8 C)  98.3 F (36.8 C)  TempSrc:  Oral  Oral  Resp: '28 21  20  '$ Height:      Weight:  189 lb 9.5 oz (86 kg)    SpO2: 100% 92% 94% 95%    Intake/Output Summary (Last 24 hours) at 05/07/15 0847 Last data filed at 05/07/15 0811  Gross per 24 hour  Intake  915.3 ml  Output    700 ml  Net  215.3 ml   Filed Weights   05/04/15 0458 05/06/15 0443 05/07/15 0308  Weight: 172 lb (78.019 kg) 180 lb 3.2 oz (81.738 kg) 189 lb 9.5 oz (86 kg)    PHYSICAL EXAM  General: frail ill appearing make in NAD. Neuro: Alert and oriented X 3. Moves all extremities spontaneously. Psych: Normal affect. HEENT:  Normal  Neck: Supple without bruits or JVD. Lungs:  Resp regular and unlabored. diminished breath sounds bilat w/ scattered  rhonchi Heart: RRR no s3, s4, or murmurs. Abdomen: Soft, non-tender, non-distended, BS + x 4.  Extremities: No clubbing, cyanosis or edema. DP/PT/Radials 2+ and equal bilaterally. R radial cath site without hematoma.   Accessory Clinical Findings  CBC  Recent Labs  05/06/15 1318 05/07/15 0305  WBC 13.2* 12.7*  HGB 11.3* 8.7*  HCT 34.9* 26.1*  MCV 88.6 89.1  PLT 222 361   Basic Metabolic Panel  Recent Labs  05/05/15 0522 05/06/15 1318 05/07/15 0305  NA 134*  --  135  K 4.5  --  4.5  CL 105  --  104  CO2 21*  --  23  GLUCOSE 140*  --  129*  BUN 12  --  13  CREATININE 0.82 0.86 0.85  CALCIUM 8.2*  --  8.2*   Liver Function Tests  Recent Labs  05/05/15 0522 05/07/15 0305  AST 21 15  ALT 10* 12*  ALKPHOS 89 79  BILITOT 0.3 0.3  PROT 6.3* 5.5*  ALBUMIN 2.6* 2.2*   No results for input(s): LIPASE, AMYLASE in the last 72 hours. Cardiac  Enzymes  Recent Labs  05/04/15 1050 05/04/15 2210 05/05/15 1525  TROPONINI 0.09* 0.21* 0.07*  Hemoglobin A1C  Recent Labs  05/05/15 0522  HGBA1C 6.1*   Fasting Lipid Panel  Recent Labs  05/05/15 0522  CHOL 120  HDL 34*  LDLCALC 75  TRIG 55  CHOLHDL 3.5   Thyroid Function Tests  Recent Labs  05/05/15 0522  TSH 0.092*    TELE  Sinus rhythm with PVCs  CAth  Coronary Stent Intervention   Left Heart Cath and Coronary Angiography    Conclusion    1. Prox RCA lesion, 80% stenosed. Post intervention with Synergy DES stent, there is a 0% residual stenosis. 2. 1st Mrg lesion, 65% stenosed - likely not cause for severe exertional angina. Therefore would treat medically. 3. Low normal LV function with normal EDP. Difficult to assess regional wall motion abnormalities   Most likely culprit lesion for the patient's symptoms was the "proximal" RCA lesion. Anatomically more mid RCA. Discussed potential treatment options with Dr. Stanford Breed. He felt that it was prudent to proceed with PCI with DES stent based  on the length of the lesion and the patient's clinical reason presentation. Successful complex PCI. Very difficult vessel to engage, requiring exchanging of guide catheter. There is a moderate to severe lesion in the mid OM branch. This is a 2.25 2.5 mm vessel short lesion. At this point I would prefer to treat this medically until his other evaluations are complete.   Plan:  Standard post radial cath PCI care with TR band removal.  Transferred to [facility].  Would continue aspirin plus Plavix for minimum of 3 months. After 3 months could likely safely suspend Plavix. Would prefer to continue coverage throughout the entire year, but would be acceptable to suspend for procedures.  Medical management for circumflex lesion.     Radiology/Studies  Dg Chest 2 View  05/04/2015  CLINICAL DATA:  Acute onset of shortness of breath. Recent fall. Initial encounter. EXAM: CHEST  2 VIEW COMPARISON:  Chest radiograph performed 05/15/2011 FINDINGS: Dense right upper lobe airspace opacification is compatible with pneumonia. The left lung appears clear. No pleural effusion or pneumothorax is seen. The heart remains normal in size. No acute osseous abnormalities are identified. IMPRESSION: Dense right upper lobe airspace opacification, compatible with pneumonia. Followup PA and lateral chest X-ray is recommended in 3-4 weeks following trial of antibiotic therapy to ensure resolution and exclude underlying malignancy. Electronically Signed   By: Garald Balding M.D.   On: 05/04/2015 00:24   Ct Head Wo Contrast  05/05/2015  CLINICAL DATA:  Recent seizure, prior CT showing lung an adrenal mass, hypertension, COPD smoker EXAM: CT HEAD WITHOUT CONTRAST TECHNIQUE: Contiguous axial images were obtained from the base of the skull through the vertex without intravenous contrast. COMPARISON:  06/19/2014 FINDINGS: Generalized atrophy. Normal ventricular morphology. No midline shift or mass  effect. Small vessel chronic ischemic changes of deep cerebral white matter. No intracranial hemorrhage, mass lesion, or acute infarction. Small size of LEFT maxillary sinus with osseous wall thickening and subtotal opacification compatible chronic sinus disease, unchanged. Visualized paranasal sinuses and mastoid air cells otherwise clear. Bones unremarkable. Atherosclerotic calcifications at skullbase. IMPRESSION: Atrophy with small vessel chronic ischemic changes of deep cerebral white matter. No acute intracranial abnormalities. In light of history of mass findings in the RIGHT lung and LEFT adrenal gland, if there is persistent clinical concern for CNS metastatic disease, recommend MR imaging of the brain with and  without contrast followup. Electronically Signed   By: Lavonia Dana M.D.   On: 05/05/2015 16:58   Ct Chest W Contrast  05/04/2015  CLINICAL DATA:  Chest pain and shortness of breath which has progressively worsened over the last few months. Chest pain is exertional, retrosternal and non radiating. Chronic cough for the last 2 months with some productive bloody sputum. Chest x-ray demonstrating right upper lobe pneumonia. EXAM: CT CHEST WITH CONTRAST TECHNIQUE: Multidetector CT imaging of the chest was performed during intravenous contrast administration. CONTRAST:  67m OMNIPAQUE IOHEXOL 300 MG/ML  SOLN COMPARISON:  Chest x-ray dated 05/04/2015. Also chest CT dated 01/17/2009. FINDINGS: There is an oval mass within the inferior segment of the right upper lobe, posteriorly, with thick enhancing wall circumferentially, measuring 7 x 5.4 x 8.4 cm (AP by transverse by craniocaudal dimensions), predominantly fluid density centrally, containing small foci of air at its upper aspect. There is extensive interstitial thickening throughout the inferior aspects of the right upper lobe, centered at and just below the level of this mass. Mild atelectasis versus fibrosis at the right lung base. Oval circumscribed  nodule within the lingula is not significantly changed compared to the previous study of 2010, compatible with benign granuloma. Left lung otherwise clear. Trachea and central bronchi unremarkable. Heart size is normal. No pericardial effusion. Coronary artery calcifications noted. Scattered atherosclerotic calcifications seen along the walls of the normal-caliber thoracic aorta. No aortic aneurysm or dissection. Left adrenal mass measures at least 6.3 x 5.8 cm, with CT density measurements ranging from 15-20 Hounsfield units, incompletely imaged at the lower portion of this exam, new compared to the previous chest CT of 01/17/2009. Limited images of the upper abdomen are otherwise unremarkable. No osseous abnormality appreciated. IMPRESSION: 1. Oval mass within the inferior segment of the right upper lobe, with thick enhancing walls circumferentially, measuring 7 x 5.4 x 8.4 cm, predominantly fluid density centrally, containing small foci of air at its upper aspect. There is extensive surrounding interstitial thickening within the right upper lobe. This may represent a necrotizing pneumonia with associated lung abscess. However, given the additional suspicious left adrenal mass (see below), this is probably more likely to represent a neoplastic lung mass with central cavitation/necrosis. The surrounding interstitial thickening within the right upper lobe could be associated edema, pneumonia or hemorrhage. 2. Large left adrenal mass measuring at least 6.3 x 5.8 cm, incompletely imaged at the lower portion of the exam, new compared to the previous chest CT of 01/17/2009. 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 3. Heart size is normal. Coronary artery calcifications noted, particularly dense at the origin of the LAD. Recommend correlation with any possible associated cardiac symptoms. These results were called by telephone at the time of interpretation on 05/04/2015 at 5:25 pm to Dr.  MJanece Canterbury, who verbally acknowledged these results. Electronically Signed   By: SFranki CabotM.D.   On: 05/04/2015 17:29   Ct Abdomen Pelvis W Contrast  05/05/2015  CLINICAL DATA:  Right upper lobe lung mass. Left adrenal mass partially visualized on chest CT. EXAM: CT ABDOMEN AND PELVIS WITH CONTRAST TECHNIQUE: Multidetector CT imaging of the abdomen and pelvis was performed using the standard protocol following bolus administration of intravenous contrast. CONTRAST:  1058mOMNIPAQUE IOHEXOL 300 MG/ML SOLN, 2582mMNIPAQUE IOHEXOL 300 MG/ML SOLN COMPARISON:  05/04/2015 chest CT.  01/17/2009 chest CT. FINDINGS: Lower chest: Subsegmental atelectasis in the basilar lower lobes. Hepatobiliary: There are 2 subcentimeter hypodense liver  lesions, too small to characterize, the more posterior of which is stable since 01/17/2009 and benign, and the other of which was not definitely seen on the 01/17/2009 chest CT. There are a few calcified gallstones in the gallbladder measuring up to 8 mm in size, with no gallbladder wall thickening or pericholecystic fluid. No biliary ductal dilatation. Pancreas: Normal, with no mass or duct dilation. Spleen: Normal size. No mass. Adrenals/Urinary Tract: Normal right adrenal. There is a 6.2 x 6.2 x 7.5 cm left adrenal mass with heterogeneous internal hypodensity, including areas of indeterminate density up to 27 HU inferiorly within the mass. This portion of the abdomen was not included on the 01/17/2009 chest CT. No hydronephrosis. There are a few scattered subcentimeter hypodense renal lesions in both kidneys, right greater the left, too small to characterize. Normal bladder, which contains excreted IV contrast from the chest CT study from 1 day prior. Stomach/Bowel: Grossly normal stomach. Normal caliber small bowel with no small bowel wall thickening. Normal appendix. Normal large bowel with no diverticulosis, large bowel wall thickening or pericolonic fat stranding.  Vascular/Lymphatic: Atherosclerotic nonaneurysmal abdominal aorta. Patent portal, splenic, hepatic and renal veins. No pathologically enlarged lymph nodes in the abdomen or pelvis. Reproductive: Normal size prostate and seminal vesicles. Other: No pneumoperitoneum, ascites or focal fluid collection. Musculoskeletal: No aggressive appearing focal osseous lesions. Unilateral right L5 pars interarticularis defect. 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 Probable small fat containing bilateral inguinal hernias. IMPRESSION: 1. Indeterminate 7.5 cm left adrenal mass, suspicious for a left adrenal metastasis given the large size and given the right lung mass. Recommend PET-CT for further evaluation of the chest and adrenal findings. 2. Subcentimeter hypodense liver lesion, too small to characterize. Recommend follow-up CT abdomen with IV contrast in 6 months. This recommendation follows ACR consensus guidelines: Managing Incidental Findings on Abdominal CT: White Paper of the ACR Incidental Findings Committee. J Am Coll Radiol 2010;7:754-773 3. Cholelithiasis. 4. 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 Electronically Signed   By: Ilona Sorrel M.D.   On: 05/05/2015 09:49   Dg Chest Port 1 View  05/04/2015  CLINICAL DATA:  Dyspnea.  Hypoxia. EXAM: PORTABLE CHEST 1 VIEW COMPARISON:  05/03/2015 FINDINGS: Cardiomediastinal silhouette is normal. Mediastinal contours appear intact. There is no evidence of pleural effusion or pneumothorax. Dense right upper lobe airspace consolidation persists. Reticular interstitial opacities involve the remainder of the right upper lobe. Osseous structures are without acute abnormality. Soft tissues are grossly normal. IMPRESSION: Persistent dense right upper lobe airspace consolidation. Differential diagnosis includes pulmonary mass, pulmonary hemorrhage or infectious or post obstructive consolidation. If patient does not present clinically as having  pneumonia, cross-sectional imaging should be considered to exclude underlying malignancy. Electronically Signed   By: Fidela Salisbury M.D.   On: 05/04/2015 14:19   Dg Abd Portable 1v  05/04/2015  CLINICAL DATA:  Abdominal distension EXAM: PORTABLE ABDOMEN - 1 VIEW COMPARISON:  07/24/2009 FINDINGS: Scattered large and small bowel gas is noted. No free air is seen. Diffuse vascular calcifications are noted. No acute bony abnormality seen. IMPRESSION: No acute abnormality noted. Electronically Signed   By: Inez Catalina M.D.   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 The has residual disease of 65% 1st Mrg lesion--> plan to treat medically. Continue DAPT with ASA and Plavix. Scr stable. Continue statin, BB and ARB. EF of 45-50% on echo with PA peak pressure of 45m HG. BP stable this morning. Ok to transfer to tele bed. No further chest pain.   Signed, Bhagat,Bhavinkumar PA-C Pager [phone] I have seen and examined the patient along with Bhagat,Bhavinkumar PA-C.  I have reviewed the chart, notes and new data.  I agree with PA's note.  Key new complaints: no angina - chest pain completely resolved. Still coughing up hemoptoic sputum, but he thinks this is clearing up a bit Key examination changes: no overt HF, no problems at cath access site, no arrhythmia Key new findings /  data: marked drop in Hgb despite no serious bleeding at cath. Unlikely to be pulmonary hemorrhage since his breathing is not seriously affected. No overt GI bleeding, Repeating labs. Suspect the 11.3 Hgb was spurious (before that 9.5)  PLAN: Very difficult situation with active hemoptysis and lung mass suspicious for malignancy in a patient with a newly placed drug eluting coronary stent for acute coronary syndrome. Very high risk for acute stent thrombosis if DAPT is stopped earlier than 3 months. This would preclude transbronchial or CT guided lung biopsy. Could we obtain diagnosis from sputum cytology? Bronchial lavage or brushings? What would we do if hemoptysis becomes more severe? Consider biopsy after stopping antiplatelets with integrilin "bridging" (not an evidence based approach, but something we have done before; would still want to delay for a month). Very challenging situation with potential for worsening if acute hemorrhage occurs. Will review options with pulmonary consultant.  MSanda Klein MD, [place]([phone]/01/2016, 10:21 AM

## 2015-05-07 NOTE — Progress Notes (Signed)
PATIENT DETAILS Name: Cristian Moss Age: 70 y.o. Sex: male Date of Birth: [birth year] Admit Date: 05/03/2015 Admitting Physician Rise Patience, MD TWS:[id number] Antony Haste, MD  Brief Summary: 70 year old male with history of epilepsy, hypertension, and COPD who presented to the emergency department after his wife witnessed a five-minute staring spell concerning for seizure.He also endorsed chest pain and shortness of breath which had been progressively worsening over the last few months. Chest x-ray on admission showed a right upper lobe mass, CT chest on 2/8 confirmed a right lung mass which was felt likely to be malignancy. Unfortunately, hospital course was complicated by worsening substernal pressure, with worsening T wave inversion. Patient was seen by cardiology, transferred to [facility], and subsequently underwent a PCI on 2/10. Currently on dual antiplatelet agents, and we will need to discuss with cardiology regarding timing of biopsy to diagnose underlying malignancy.  Subjective: Had some mild hemoptysis yesterday. None today. No chest pain or shortness of breath.  Assessment/Plan: Acute hypoxic respiratory failure: Secondary to COPD exacerbation, lung mass-? Postobstructive pneumonia. Continue bronchodilators, steroids and empiric doxycycline. Attempt to taper/titrate down oxygen as tolerated. Influenza PCR negative, blood/sputum cultures negative so far, urine negative for Streptococcus/Legionella antigen.  COPD exacerbation: Taper steroids further-transition to prednisone, continue bronchodilators and empiric antibiotics. Lungs clear on exam without any rhonchi. Follow.  Non-STEMI: Upon hospitalization-continued to have chest pain and worsening T wave inversions. Seen by cardiology, given severity of chest pain/EKG changes-it was felt that patient required cardiac catheterization with intervention before proceeding with workup of the lung mass. Patient was  subsequently transferred to [facility] and underwent PCI to RCA on 2/10. Continue aspirin, Plavix, statin and metoprolol. Spoke with cardiology-okay to undergo bronchoscopy and brushings but likely not safe to undergo needle biopsy at this point. Will discuss with the PCCM on Monday.   Chronic systolic heart failure: Clinically compensated, echocardiogram demonstrated mildly reduced EF of around 45%. Continue metoprolol and losartan., start diuretics if volume overloaded.  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 Spoke with Dr. Wallie Renshaw today-although this could be a cavitary malignancy-infection is still in the differential. He suggested we treat with antibiotics, consult [facility] for bronchoscopy with cytology/cultures. A outpatient PET scan could be contemplated once patient completes a course of antibiotics. We will discuss with PCCM on Monday-to see if feasible to do bronchoscopy with just cytology/cultures to see if we can get diagnoses that way. Briefly discussed issue of tissue diagnosis with family and difficulties given dual antiplatelet on board yesterday.  Breakthrough seizure-history of seizure disorder: Patient presented with suspected breakthrough seizures-Dr. Sheran Fava spoke with neurology over the phone, recommendations were to increase Dilantin to 350 mg.Recheck Dilantin on 2/14. Continue Keppra 1500 mg twice a day.  Anemia: Drop in hemoglobin compared to yesterday-but yesterday's hemoglobin likely an error as prior levels were mostly in the 9s range. Follow closely. Note no evidence of any GI bleed or any significant hemoptysis at this time.  Hypertension: Currently controlled, continue metoprolol and losartan. Follow.  Tobacco abuse: Counseled.  Disposition: Remain  inpatient  Antimicrobial agents  See below  Anti-infectives    Start     Dose/Rate Route Frequency Ordered Stop   05/05/15 2200  doxycycline (VIBRA-TABS) tablet 100 mg  100 mg Oral Every 12 hours 05/05/15 1828     05/04/15 2359  azithromycin (ZITHROMAX) 500 mg in dextrose 5 % 250 mL IVPB  Status:  Discontinued     500 mg 250 mL/hr over 60 Minutes Intravenous Every 24 hours 05/04/15 0616 05/05/15 1354   05/04/15 2200  cefTRIAXone (ROCEPHIN) 1 g in dextrose 5 % 50 mL IVPB  Status:  Discontinued     1 g 100 mL/hr over 30 Minutes Intravenous Every 24 hours 05/04/15 0616 05/05/15 1317   05/04/15 0242  dextrose 5 % with azithromycin (ZITHROMAX) ADS Med    Comments:  Rayetta Pigg   : cabinet override      05/04/15 0242 05/04/15 1444   05/04/15 0156  dextrose 5 % with cefTRIAXone (ROCEPHIN) ADS Med    Comments:  Rayetta Pigg   : cabinet override      05/04/15 0156 05/04/15 1359      DVT Prophylaxis: Prophylactic Heparin   Code Status: Full code  Family Communication Daughter/Spouse at bedside  Procedures: None  CONSULTS:  cardiology and IR  Time spent 30 minutes-Greater than 50% of this time was spent in counseling, explanation of diagnosis, planning of further management, and coordination of care.  MEDICATIONS: Scheduled Meds: . antiseptic oral rinse  7 mL Mouth Rinse BID  . arformoterol  15 mcg Nebulization BID  . aspirin EC  81 mg Oral Daily  . atorvastatin  80 mg Oral q1800  . bromocriptine  2.5 mg Oral BID  . budesonide (PULMICORT) nebulizer solution  0.5 mg Nebulization BID  . clopidogrel  75 mg Oral Q breakfast  . doxycycline  100 mg Oral Q12H  . ferrous sulfate  325 mg Oral TID WC  . folic acid  1 mg Oral Daily  . heparin  5,000 Units Subcutaneous 3 times per day  . levETIRAcetam  1,500 mg Oral Q12H  . losartan  50 mg Oral Daily  . methylPREDNISolone (SOLU-MEDROL) injection  40 mg Intravenous BID  . metoprolol tartrate  12.5 mg Oral BID  .  nitroGLYCERIN  0.5 inch Topical 4 times per day  . phenytoin  300 mg Oral QHS   And  . phenytoin  50 mg Oral QHS  . polyethylene glycol  17 g Oral Daily  . sodium chloride flush  3 mL Intravenous Q12H   Continuous Infusions:  PRN Meds:.sodium chloride, acetaminophen, ipratropium-albuterol, nitroGLYCERIN, ondansetron (ZOFRAN) IV, oxyCODONE, sodium chloride flush    PHYSICAL EXAM: Vital signs in last 24 hours: Filed Vitals:   05/07/15 0308 05/07/15 0750 05/07/15 0800 05/07/15 0811  BP: 130/63   125/64  Pulse: 80   92  Temp: 98.2 F (36.8 C)   98.3 F (36.8 C)  TempSrc: Oral   Oral  Resp: '21  21 20  '$ Height:      Weight: 86 kg (189 lb 9.5 oz)     SpO2: 92% 94%  95%    Weight change: 4.262 kg (9 lb 6.3 oz) Filed Weights   05/04/15 0458 05/06/15 0443 05/07/15 0308  Weight: 78.019 kg (172 lb) 81.738 kg (180 lb 3.2 oz) 86 kg (189 lb 9.5 oz)   Body mass index is 27.2 kg/(m^2).   Gen Exam: Awake and alert with clear speech.  Neck: Supple, No JVD.   Chest: B/L Clear.   CVS: S1 S2 Regular, no murmurs.  Abdomen: soft, BS +, non tender, non distended.  Extremities: no edema, lower extremities warm to touch. Neurologic: Non Focal.   Skin:  No Rash.   Wounds: N/A.    Intake/Output from previous day:  Intake/Output Summary (Last 24 hours) at 05/07/15 1101 Last data filed at 05/07/15 0811  Gross per 24 hour  Intake  915.3 ml  Output    700 ml  Net  215.3 ml     LAB RESULTS: CBC  Recent Labs Lab 05/03/15 2359 05/04/15 1050 05/05/15 0522 05/06/15 0204 05/06/15 1318 05/07/15 0305  WBC 10.8* 10.7* 13.6* 12.7* 13.2* 12.7*  HGB 10.4* 11.0* 9.7* 9.5* 11.3* 8.7*  HCT 32.5* 34.6* 30.3* 29.6* 34.9* 26.1*  PLT 237 230 229 195 222 205  MCV 91.8 91.5 91.0 88.6 88.6 89.1  MCH 29.4 29.1 29.1 28.4 28.7 29.7  MCHC 32.0 31.8 32.0 32.1 32.4 33.3  RDW 13.3 13.3 13.2 13.1 13.4 13.4  LYMPHSABS 1.6  --   --   --   --   --   MONOABS 0.9  --   --   --   --   --   EOSABS 0.2  --   --    --   --   --   BASOSABS 0.0  --   --   --   --   --     Chemistries   Recent Labs Lab 05/03/15 2359 05/04/15 1050 05/05/15 0522 05/06/15 1318 05/07/15 0305  NA 142 139 134*  --  135  K 4.1 3.8 4.5  --  4.5  CL 107 109 105  --  104  CO2 23 22 21*  --  23  GLUCOSE 101* 101* 140*  --  129*  BUN 24* 15 12  --  13  CREATININE 1.23 0.86 0.82 0.86 0.85  CALCIUM 9.1 8.3* 8.2*  --  8.2*    CBG:  Recent Labs Lab 05/04/15 0011  GLUCAP 101*    GFR Estimated Creatinine Clearance: 82.3 mL/min (by C-G formula based on Cr of 0.85).  Coagulation profile  Recent Labs Lab 05/06/15 0204  INR 1.26    Cardiac Enzymes  Recent Labs Lab 05/04/15 1050 05/04/15 2210 05/05/15 1525  TROPONINI 0.09* 0.21* 0.07*    Invalid input(s): POCBNP No results for input(s): DDIMER in the last 72 hours.  Recent Labs  05/05/15 0522  HGBA1C 6.1*    Recent Labs  05/05/15 0522  CHOL 120  HDL 34*  LDLCALC 75  TRIG 55  CHOLHDL 3.5    Recent Labs  05/05/15 0522  TSH 0.092*    Recent Labs  05/05/15 0522  VITAMINB12 367  FOLATE 4.2*  FERRITIN 252  TIBC 224*  IRON 42*   No results for input(s): LIPASE, AMYLASE in the last 72 hours.  Urine Studies No results for input(s): UHGB, CRYS in the last 72 hours.  Invalid input(s): UACOL, UAPR, USPG, UPH, UTP, UGL, UKET, UBIL, UNIT, UROB, ULEU, UEPI, UWBC, URBC, UBAC, CAST, UCOM, BILUA  MICROBIOLOGY: Recent Results (from the past 240 hour(s))  Culture, blood (Routine X 2) w Reflex to ID Panel     Status: None (Preliminary result)   Collection Time: 05/04/15 12:00 AM  Result Value Ref Range Status   Specimen Description BLOOD RIGHT ANTECUBITAL  Final   Special Requests BOTTLES DRAWN AEROBIC ONLY 5ML  Final   Culture   Final    NO GROWTH 3 DAYS Performed at [facility]    Report Status PENDING  Incomplete  Culture, blood (Routine X 2) w Reflex to ID Panel     Status: None (Preliminary result)   Collection  Time:  05/04/15  2:58 AM  Result Value Ref Range Status   Specimen Description BLOOD LEFT HAND  Final   Special Requests IN PEDIATRIC BOTTLE 1ML  Final   Culture   Final    NO GROWTH 3 DAYS Performed at [facility]    Report Status PENDING  Incomplete  C difficile quick scan w PCR reflex     Status: None   Collection Time: 05/04/15 10:00 AM  Result Value Ref Range Status   C Diff antigen NEGATIVE NEGATIVE Final   C Diff toxin NEGATIVE NEGATIVE Final   C Diff interpretation Negative for toxigenic C. difficile  Final  Culture, sputum-assessment     Status: None   Collection Time: 05/05/15  7:47 AM  Result Value Ref Range Status   Specimen Description SPUTUM  Final   Special Requests NONE  Final   Sputum evaluation THIS SPECIMEN IS ACCEPTABLE FOR SPUTUM CULTURE  Final   Report Status 05/05/2015 FINAL  Final  Culture, respiratory (NON-Expectorated)     Status: None (Preliminary result)   Collection Time: 05/05/15  7:47 AM  Result Value Ref Range Status   Specimen Description SPUTUM  Final   Special Requests NONE  Final   Gram Stain   Final    FEW WBC PRESENT, PREDOMINANTLY PMN MODERATE SQUAMOUS EPITHELIAL CELLS PRESENT MODERATE GRAM POSITIVE COCCI IN PAIRS AND CHAINS FEW GRAM POSITIVE RODS Performed at [facility]    Culture PENDING  Incomplete   Report Status PENDING  Incomplete  MRSA PCR Screening     Status: None   Collection Time: 05/05/15 10:20 PM  Result Value Ref Range Status   MRSA by PCR NEGATIVE NEGATIVE Final    Comment:        The GeneXpert MRSA Assay (FDA approved for NASAL specimens only), is one component of a comprehensive MRSA colonization surveillance program. It is not intended to diagnose MRSA infection nor to guide or monitor treatment for MRSA infections.     RADIOLOGY STUDIES/RESULTS: Dg Chest 2 View  05/04/2015  CLINICAL DATA:  Acute onset of shortness of breath. Recent fall. Initial encounter. EXAM: CHEST  2 VIEW COMPARISON:  Chest  radiograph performed 05/15/2011 FINDINGS: Dense right upper lobe airspace opacification is compatible with pneumonia. The left lung appears clear. No pleural effusion or pneumothorax is seen. The heart remains normal in size. No acute osseous abnormalities are identified. IMPRESSION: Dense right upper lobe airspace opacification, compatible with pneumonia. Followup PA and lateral chest X-ray is recommended in 3-4 weeks following trial of antibiotic therapy to ensure resolution and exclude underlying malignancy. Electronically Signed   By: Garald Balding M.D.   On: 05/04/2015 00:24   Ct Head Wo Contrast  05/05/2015  CLINICAL DATA:  Recent seizure, prior CT showing lung an adrenal mass, hypertension, COPD smoker EXAM: CT HEAD WITHOUT CONTRAST TECHNIQUE: Contiguous axial images were obtained from the base of the skull through the vertex without intravenous contrast. COMPARISON:  06/19/2014 FINDINGS: Generalized atrophy. Normal ventricular morphology. No midline shift or mass effect. Small vessel chronic ischemic changes of deep cerebral white matter. No intracranial hemorrhage, mass lesion, or acute infarction. Small size of LEFT maxillary sinus with osseous wall thickening and subtotal opacification compatible chronic sinus disease, unchanged. Visualized paranasal sinuses and mastoid air cells otherwise clear. Bones unremarkable. Atherosclerotic calcifications at skullbase. IMPRESSION: Atrophy with small vessel chronic ischemic changes of deep cerebral white matter. No acute intracranial abnormalities. In light of history of mass findings in the RIGHT  lung and LEFT adrenal gland, if there is persistent clinical concern for CNS metastatic disease, recommend MR imaging of the brain with and without contrast followup. Electronically Signed   By: Lavonia Dana M.D.   On: 05/05/2015 16:58   Ct Chest W Contrast  05/04/2015  CLINICAL DATA:  Chest pain and shortness of breath which has progressively worsened over the last  few months. Chest pain is exertional, retrosternal and non radiating. Chronic cough for the last 2 months with some productive bloody sputum. Chest x-ray demonstrating right upper lobe pneumonia. EXAM: CT CHEST WITH CONTRAST TECHNIQUE: Multidetector CT imaging of the chest was performed during intravenous contrast administration. CONTRAST:  18m OMNIPAQUE IOHEXOL 300 MG/ML  SOLN COMPARISON:  Chest x-ray dated 05/04/2015. Also chest CT dated 01/17/2009. FINDINGS: There is an oval mass within the inferior segment of the right upper lobe, posteriorly, with thick enhancing wall circumferentially, measuring 7 x 5.4 x 8.4 cm (AP by transverse by craniocaudal dimensions), predominantly fluid density centrally, containing small foci of air at its upper aspect. There is extensive interstitial thickening throughout the inferior aspects of the right upper lobe, centered at and just below the level of this mass. Mild atelectasis versus fibrosis at the right lung base. Oval circumscribed nodule within the lingula is not significantly changed compared to the previous study of 2010, compatible with benign granuloma. Left lung otherwise clear. Trachea and central bronchi unremarkable. Heart size is normal. No pericardial effusion. Coronary artery calcifications noted. Scattered atherosclerotic calcifications seen along the walls of the normal-caliber thoracic aorta. No aortic aneurysm or dissection. Left adrenal mass measures at least 6.3 x 5.8 cm, with CT density measurements ranging from 15-20 Hounsfield units, incompletely imaged at the lower portion of this exam, new compared to the previous chest CT of 01/17/2009. Limited images of the upper abdomen are otherwise unremarkable. No osseous abnormality appreciated. IMPRESSION: 1. Oval mass within the inferior segment of the right upper lobe, with thick enhancing walls circumferentially, measuring 7 x 5.4 x 8.4 cm, predominantly fluid density centrally, containing small foci of  air at its upper aspect. There is extensive surrounding interstitial thickening within the right upper lobe. This may represent a necrotizing pneumonia with associated lung abscess. However, given the additional suspicious left adrenal mass (see below), this is probably more likely to represent a neoplastic lung mass with central cavitation/necrosis. The surrounding interstitial thickening within the right upper lobe could be associated edema, pneumonia or hemorrhage. 2. Large left adrenal mass measuring at least 6.3 x 5.8 cm, incompletely imaged at the lower portion of the exam, new compared to the previous chest CT of 01/17/2009. 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 3. Heart size is normal. Coronary artery calcifications noted, particularly dense at the origin of the LAD. Recommend correlation with any possible associated cardiac symptoms. These results were called by telephone at the time of interpretation on 05/04/2015 at 5:25 pm to Dr. MJanece Canterbury, who verbally acknowledged these results. Electronically Signed   By: SFranki CabotM.D.   On: 05/04/2015 17:29   Ct Abdomen Pelvis W Contrast  05/05/2015  CLINICAL DATA:  Right upper lobe lung mass. Left adrenal mass partially visualized on chest CT. EXAM: CT ABDOMEN AND PELVIS WITH CONTRAST TECHNIQUE: Multidetector CT imaging of the abdomen and pelvis was performed using the standard protocol following bolus administration of intravenous contrast. CONTRAST:  1077mOMNIPAQUE IOHEXOL 300 MG/ML SOLN, 2546mMNIPAQUE IOHEXOL 300 MG/ML SOLN COMPARISON:  05/04/2015  chest CT.  01/17/2009 chest CT. FINDINGS: Lower chest: Subsegmental atelectasis in the basilar lower lobes. Hepatobiliary: There are 2 subcentimeter hypodense liver lesions, too small to characterize, the more posterior of which is stable since 01/17/2009 and benign, and the other of which was not definitely seen on the 01/17/2009 chest CT. There are a few calcified  gallstones in the gallbladder measuring up to 8 mm in size, with no gallbladder wall thickening or pericholecystic fluid. No biliary ductal dilatation. Pancreas: Normal, with no mass or duct dilation. Spleen: Normal size. No mass. Adrenals/Urinary Tract: Normal right adrenal. There is a 6.2 x 6.2 x 7.5 cm left adrenal mass with heterogeneous internal hypodensity, including areas of indeterminate density up to 27 HU inferiorly within the mass. This portion of the abdomen was not included on the 01/17/2009 chest CT. No hydronephrosis. There are a few scattered subcentimeter hypodense renal lesions in both kidneys, right greater the left, too small to characterize. Normal bladder, which contains excreted IV contrast from the chest CT study from 1 day prior. Stomach/Bowel: Grossly normal stomach. Normal caliber small bowel with no small bowel wall thickening. Normal appendix. Normal large bowel with no diverticulosis, large bowel wall thickening or pericolonic fat stranding. Vascular/Lymphatic: Atherosclerotic nonaneurysmal abdominal aorta. Patent portal, splenic, hepatic and renal veins. No pathologically enlarged lymph nodes in the abdomen or pelvis. Reproductive: Normal size prostate and seminal vesicles. Other: No pneumoperitoneum, ascites or focal fluid collection. Musculoskeletal: No aggressive appearing focal osseous lesions. Unilateral right L5 pars interarticularis defect. 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 Probable small fat containing bilateral inguinal hernias. IMPRESSION: 1. Indeterminate 7.5 cm left adrenal mass, suspicious for a left adrenal metastasis given the large size and given the right lung mass. Recommend PET-CT for further evaluation of the chest and adrenal findings. 2. Subcentimeter hypodense liver lesion, too small to characterize. Recommend follow-up CT abdomen with IV contrast in 6 months. This recommendation follows ACR consensus guidelines: Managing Incidental Findings on Abdominal  CT: White Paper of the ACR Incidental Findings Committee. J Am Coll Radiol 2010;7:754-773 3. Cholelithiasis. 4. 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 Electronically Signed   By: Ilona Sorrel M.D.   On: 05/05/2015 09:49   Dg Chest Port 1 View  05/04/2015  CLINICAL DATA:  Dyspnea.  Hypoxia. EXAM: PORTABLE CHEST 1 VIEW COMPARISON:  05/03/2015 FINDINGS: Cardiomediastinal silhouette is normal. Mediastinal contours appear intact. There is no evidence of pleural effusion or pneumothorax. Dense right upper lobe airspace consolidation persists. Reticular interstitial opacities involve the remainder of the right upper lobe. Osseous structures are without acute abnormality. Soft tissues are grossly normal. IMPRESSION: Persistent dense right upper lobe airspace consolidation. Differential diagnosis includes pulmonary mass, pulmonary hemorrhage or infectious or post obstructive consolidation. If patient does not present clinically as having pneumonia, cross-sectional imaging should be considered to exclude underlying malignancy. Electronically Signed   By: Fidela Salisbury M.D.   On: 05/04/2015 14:19   Dg Abd Portable 1v  05/04/2015  CLINICAL DATA:  Abdominal distension EXAM: PORTABLE ABDOMEN - 1 VIEW COMPARISON:  07/24/2009 FINDINGS: Scattered large and small bowel gas is noted. No free air is seen. Diffuse vascular calcifications are noted. No acute bony abnormality seen. IMPRESSION: No acute abnormality noted. Electronically Signed   By: Inez Catalina M.D.   On: 05/04/2015 10:02    Oren Binet, MD  [facility] Pager:[phone]  If 7PM-7AM, please contact night-coverage [email or web address] Password [email or web address] 05/07/2015, 11:01 AM   LOS: 3 days

## 2015-05-07 NOTE — Progress Notes (Signed)
Chaney Malling NP made aware of pt expectorating small to moderate amounts of bloody mucus.. Instructed to continue administering the ordered  Heparin 5,000 U [place].

## 2015-05-08 DIAGNOSIS — E279 Disorder of adrenal gland, unspecified: Secondary | ICD-10-CM

## 2015-05-08 DIAGNOSIS — I214 Non-ST elevation (NSTEMI) myocardial infarction: Principal | ICD-10-CM

## 2015-05-08 DIAGNOSIS — J441 Chronic obstructive pulmonary disease with (acute) exacerbation: Secondary | ICD-10-CM

## 2015-05-08 LAB — COMPREHENSIVE METABOLIC PANEL
ALK PHOS: 76 U/L (ref 38–126)
ALT: 11 U/L — AB (ref 17–63)
AST: 13 U/L — AB (ref 15–41)
Albumin: 2.3 g/dL — ABNORMAL LOW (ref 3.5–5.0)
Anion gap: 9 (ref 5–15)
BILIRUBIN TOTAL: 0.2 mg/dL — AB (ref 0.3–1.2)
BUN: 13 mg/dL (ref 6–20)
CALCIUM: 8.2 mg/dL — AB (ref 8.9–10.3)
CHLORIDE: 105 mmol/L (ref 101–111)
CO2: 23 mmol/L (ref 22–32)
CREATININE: 0.94 mg/dL (ref 0.61–1.24)
Glucose, Bld: 138 mg/dL — ABNORMAL HIGH (ref 65–99)
Potassium: 4.8 mmol/L (ref 3.5–5.1)
Sodium: 137 mmol/L (ref 135–145)
Total Protein: 6.2 g/dL — ABNORMAL LOW (ref 6.5–8.1)

## 2015-05-08 LAB — CBC
HCT: 26.9 % — ABNORMAL LOW (ref 39.0–52.0)
Hemoglobin: 8.6 g/dL — ABNORMAL LOW (ref 13.0–17.0)
MCH: 28.2 pg (ref 26.0–34.0)
MCHC: 32 g/dL (ref 30.0–36.0)
MCV: 88.2 fL (ref 78.0–100.0)
PLATELETS: 229 10*3/uL (ref 150–400)
RBC: 3.05 MIL/uL — AB (ref 4.22–5.81)
RDW: 13.4 % (ref 11.5–15.5)
WBC: 12.6 10*3/uL — AB (ref 4.0–10.5)

## 2015-05-08 LAB — CULTURE, RESPIRATORY W GRAM STAIN: Culture: NORMAL

## 2015-05-08 MED ORDER — GI COCKTAIL ~~LOC~~: 30.0000 mL | Freq: Three times a day (TID) | ORAL | Status: DC | PRN

## 2015-05-08 MED ORDER — PANTOPRAZOLE SODIUM 40 MG PO TBEC
40.0000 mg | DELAYED_RELEASE_TABLET | Freq: Two times a day (BID) | ORAL | Status: DC
  Administered 2015-05-08 – 2015-05-09 (×2): 40 mg via ORAL
  Filled 2015-05-08 (×2): qty 1

## 2015-05-08 MED ORDER — ACETAMINOPHEN 500 MG PO TABS
1000.0000 mg | ORAL_TABLET | Freq: Three times a day (TID) | ORAL | Status: DC
  Administered 2015-05-08 – 2015-05-09 (×3): 1000 mg via ORAL
  Filled 2015-05-08 (×3): qty 2

## 2015-05-08 MED ORDER — PREDNISONE 20 MG PO TABS
40.0000 mg | ORAL_TABLET | Freq: Every day | ORAL | Status: DC
  Administered 2015-05-09: 40 mg via ORAL
  Filled 2015-05-08: qty 2

## 2015-05-08 MED ORDER — OXYCODONE HCL 5 MG PO TABS
5.0000 mg | ORAL_TABLET | Freq: Four times a day (QID) | ORAL | Status: DC | PRN
  Administered 2015-05-09 (×2): 5 mg via ORAL
  Filled 2015-05-08 (×2): qty 1

## 2015-05-08 NOTE — Plan of Care (Signed)
Problem: Activity: Goal: Ability to return to baseline activity level will improve Outcome: Progressing Patient ambulating to the restroom with assistance x1, Patient tolerates well.

## 2015-05-08 NOTE — Progress Notes (Signed)
Patient Name: Cristian Moss Date of Encounter: 05/08/2015  Principal Problem:   CAP (community acquired pneumonia) Active Problems:   Essential hypertension   EMPHYSEMA   Seizure (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 Denies chest pain. Had some epigastric discomfort  Admitted for a breakthrough seizure, he developed angina with ECG changes and abnormal enzymes for which he received a drug-eluting stent (Synergy) to the RCA, also has 65% OM1 stenosis left for medical Rx. Unfortunately also has lung and adrenal masses suspicious for lung cancer with metastasis and developed frank hemoptysis.  CURRENT MEDS . acetaminophen  1,000 mg Oral TID  . amoxicillin-clavulanate  1 tablet Oral Q12H  . antiseptic oral rinse  7 mL Mouth Rinse BID  . arformoterol  15 mcg Nebulization BID  . aspirin EC  81 mg Oral Daily  . atorvastatin  80 mg Oral q1800  . bromocriptine  2.5 mg Oral BID  . budesonide (PULMICORT) nebulizer solution  0.5 mg Nebulization BID  . clopidogrel  75 mg Oral Q breakfast  . ferrous sulfate  325 mg Oral TID WC  . folic acid  1 mg Oral Daily  . levETIRAcetam  1,500 mg Oral Q12H  . losartan  50 mg Oral Daily  . metoprolol tartrate  12.5 mg Oral BID  . nitroGLYCERIN  0.5 inch Topical 4 times per day  . pantoprazole  40 mg Oral BID  . phenytoin  300 mg Oral QHS   And  . phenytoin  50 mg Oral QHS  . polyethylene glycol  17 g Oral Daily  . [START ON  05/09/2015] predniSONE  40 mg Oral Q breakfast  . sodium chloride flush  3 mL Intravenous Q12H    OBJECTIVE   Intake/Output Summary (Last 24 hours) at 05/08/15 1418 Last data filed at 05/08/15 1300  Gross per 24 hour  Intake    843 ml  Output    475 ml  Net    368 ml   Filed Weights   05/06/15 0443 05/07/15 0308 05/08/15 0618  Weight: 81.738 kg (180 lb 3.2 oz) 86 kg (189 lb 9.5 oz) 80.015 kg (176 lb 6.4 oz)    PHYSICAL EXAM Filed Vitals:   05/08/15 0618 05/08/15 0947 05/08/15 1043 05/08/15 1310  BP: 102/73  101/59 108/69  Pulse: 96 90 90 94  Temp:    98.9 F (37.2 C)  TempSrc:    Oral  Resp: '20 18  18  '$ Height:      Weight: 80.015 kg (176 lb 6.4 oz)     SpO2: 100% 98%  100%   General: frail ill appearing make in NAD. Neuro: Alert and oriented X 3. Moves all extremities spontaneously. Psych: Normal affect. HEENT: Normal Neck: Supple without bruits or JVD. Lungs: Resp regular and unlabored. diminished breath sounds bilat w/ scattered rhonchi Heart: RRR no s3, s4, or murmurs. Abdomen: Soft, non-tender, non-distended,  BS + x 4.  Extremities: No clubbing, cyanosis or edema. DP/PT/Radials 2+ and equal bilaterally. R radial cath site without hematoma.   LABS  CBC  Recent Labs  05/07/15 0305 05/08/15 0320  WBC 12.7* 12.6*  HGB 8.7* 8.6*  HCT 26.1* 26.9*  MCV 89.1 88.2  PLT 205 882   Basic Metabolic Panel  Recent Labs  05/07/15 0305 05/08/15 0320  NA 135 137  K 4.5 4.8  CL 104 105  CO2 23 23  GLUCOSE 129* 138*  BUN 13 13  CREATININE 0.85 0.94  CALCIUM 8.2* 8.2*   Liver Function Tests  Recent Labs  05/07/15 0305 05/08/15 0320  AST 15 13*  ALT 12* 11*  ALKPHOS 79 76  BILITOT 0.3 0.2*  PROT 5.5* 6.2*  ALBUMIN 2.2* 2.3*   No results for input(s): LIPASE, AMYLASE in the last 72 hours. Cardiac Enzymes  Recent Labs  05/05/15 1525  TROPONINI 0.07*    Radiology Studies Imaging results have been reviewed and No results  found.  TELE NSR   ASSESSMENT AND PLAN  1. CAD s/p DES for unstable angina/small NSTEMI - Very high risk for acute stent thrombosis if DAPT is stopped earlier than 3 months. This would preclude transbronchial or CT guided lung biopsy. Could we obtain diagnosis from sputum cytology? Bronchial lavage or brushings? Consider biopsy after stopping antiplatelets with integrilin "bridging" (not an evidence based approach, but something we have done before; would still want to delay for a month).  2. Lung mass, adrenal mass (metastasis), hemoptysis improved. Signs of improving COPD excerbation.  3. Mild LV dysfunction, EF 45%. On metoprolol and losartan. No overt CHF.  4. Anemia - no change in Hgb and no overt bleeding last 24 h.  5. Seizure disorder  6. HTN   Sanda Klein, MD, [facility] [phone] office [phone] pager 05/08/2015 2:18 PM

## 2015-05-08 NOTE — Progress Notes (Signed)
PATIENT DETAILS Name: Cristian Moss Age: 70 y.o. Sex: male Date of Birth: Jun 08, 1945 Admit Date: 05/03/2015 Admitting Physician Rise Patience, MD YKD:XIPJA,SNKNLZJ Antony Haste, MD  Brief Summary: 70 year old male with history of epilepsy, hypertension, and COPD who presented to the emergency department after his wife witnessed a five-minute staring spell concerning for seizure.He also endorsed chest pain and shortness of breath which had been progressively worsening over the last few months. Chest x-ray on admission showed a right upper lobe mass, CT chest on 2/8 confirmed a right lung mass which was felt likely to be malignancy. Unfortunately, hospital course was complicated by worsening substernal pressure, with worsening T wave inversion. Patient was seen by cardiology, transferred to [facility], and subsequently underwent a PCI on 2/10. Currently on dual antiplatelet agents, and we will need to discuss with cardiology regarding timing of biopsy to diagnose underlying malignancy.  Subjective: Some mild hemoptysis and mild epigastric pain last night. None this am. No chest pain or shortness of breath.  Assessment/Plan: Acute hypoxic respiratory failure: Secondary to COPD exacerbation, lung mass-? Postobstructive pneumonia. Continue bronchodilators, steroids and empiric Augmentin. Attempt to taper/titrate down oxygen as tolerated. Influenza PCR negative, blood/sputum cultures negative so far, urine negative for Streptococcus/Legionella antigen.  COPD exacerbation: Taper steroids further-transition to prednisone, continue bronchodilators and empiric antibiotics. Lungs clear on exam without any rhonchi. Follow.  Non-STEMI: Upon hospitalization-continued to have chest pain and worsening T wave inversions. Seen by cardiology, given severity of chest pain/EKG changes-it was felt that patient required cardiac catheterization with intervention before proceeding with workup of the lung  mass. Patient was subsequently transferred to [facility] and underwent PCI to RCA on 2/10. Continue aspirin, Plavix, statin and metoprolol. Spoke with cardiology-okay to undergo bronchoscopy and brushings but likely not safe to undergo needle biopsy at this point. Will discuss with the PCCM on Monday.   Chronic systolic heart failure: Clinically compensated, echocardiogram demonstrated mildly reduced EF of around 45%. Continue metoprolol and losartan., start diuretics if volume overloaded.  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 Spoke with Dr. Wallie Renshaw 2/11-although this could be a cavitary malignancy-infection is still in the differential. He suggested we treat with antibiotics, consult PCCM for bronchoscopy with cytology/cultures. A outpatient PET scan could be contemplated once patient completes a course of antibiotics. We will discuss with PCCM on 2/13-to see if feasible to do bronchoscopy with just cytology/cultures to see if we can get diagnoses that way.   Breakthrough seizure-history of seizure disorder: Patient presented with suspected breakthrough seizures-Dr. Sheran Fava spoke with neurology over the phone, recommendations were to increase Dilantin to 350 mg.Recheck Dilantin on 2/14. Continue Keppra 1500 mg twice a day.  Anemia: Suspect secondary to chronic disease-worsened by acute illness, hemoglobin stable compared to yesterday. Mild hemoptysis overnight-no overt GI bleeding. Follow.  Hypertension: Currently controlled, continue metoprolol and losartan. Follow.  Tobacco abuse: Counseled.  Disposition: Remain inpatient-suspect home in the next 1-2 days   Antimicrobial agents  See below  Anti-infectives    Start     Dose/Rate Route Frequency Ordered Stop   05/07/15 1115   amoxicillin-clavulanate (AUGMENTIN) 875-125 MG per tablet 1 tablet     1 tablet Oral Every 12 hours 05/07/15 1113     05/05/15 2200  doxycycline (  VIBRA-TABS) tablet 100 mg  Status:  Discontinued     100 mg Oral Every 12 hours 05/05/15 1828 05/07/15 1113   05/04/15 2359  azithromycin (ZITHROMAX) 500 mg in dextrose 5 % 250 mL IVPB  Status:  Discontinued     500 mg 250 mL/hr over 60 Minutes Intravenous Every 24 hours 05/04/15 0616 05/05/15 1354   05/04/15 2200  cefTRIAXone (ROCEPHIN) 1 g in dextrose 5 % 50 mL IVPB  Status:  Discontinued     1 g 100 mL/hr over 30 Minutes Intravenous Every 24 hours 05/04/15 0616 05/05/15 1317   05/04/15 0242  dextrose 5 % with azithromycin (ZITHROMAX) ADS Med    Comments:  Rayetta Pigg   : cabinet override      05/04/15 0242 05/04/15 1444   05/04/15 0156  dextrose 5 % with cefTRIAXone (ROCEPHIN) ADS Med    Comments:  Rayetta Pigg   : cabinet override      05/04/15 0156 05/04/15 1359      DVT Prophylaxis: Discontinue Prophylactic Heparin given hemoptysis-start SCDs   Code Status: Full code  Family Communication None at bedside  Procedures: None  CONSULTS:  cardiology and IR  Time spent 25 minutes-Greater than 50% of this time was spent in counseling, explanation of diagnosis, planning of further management, and coordination of care.  MEDICATIONS: Scheduled Meds: . amoxicillin-clavulanate  1 tablet Oral Q12H  . antiseptic oral rinse  7 mL Mouth Rinse BID  . arformoterol  15 mcg Nebulization BID  . aspirin EC  81 mg Oral Daily  . atorvastatin  80 mg Oral q1800  . bromocriptine  2.5 mg Oral BID  . budesonide (PULMICORT) nebulizer solution  0.5 mg Nebulization BID  . clopidogrel  75 mg Oral Q breakfast  . ferrous sulfate  325 mg Oral TID WC  . folic acid  1 mg Oral Daily  . levETIRAcetam  1,500 mg Oral Q12H  . losartan  50 mg Oral Daily  . methylPREDNISolone (SOLU-MEDROL) injection  40 mg Intravenous BID  . metoprolol tartrate   12.5 mg Oral BID  . nitroGLYCERIN  0.5 inch Topical 4 times per day  . pantoprazole  40 mg Oral QHS  . phenytoin  300 mg Oral QHS   And  . phenytoin  50 mg Oral QHS  . polyethylene glycol  17 g Oral Daily  . sodium chloride flush  3 mL Intravenous Q12H   Continuous Infusions:  PRN Meds:.sodium chloride, acetaminophen, ipratropium-albuterol, nitroGLYCERIN, ondansetron (ZOFRAN) IV, oxyCODONE, sodium chloride flush    PHYSICAL EXAM: Vital signs in last 24 hours: Filed Vitals:   05/08/15 0015 05/08/15 0316 05/08/15 0618 05/08/15 0947  BP: 111/65 115/77 102/73   Pulse: 100 100 96 90  Temp: 99.4 F (37.4 C) 98.8 F (37.1 C)    TempSrc: Oral Oral    Resp: '20 20 20 18  '$ Height:      Weight:   80.015 kg (176 lb 6.4 oz)   SpO2: 89% 95% 100% 98%    Weight change: -5.985 kg (-13 lb 3.1 oz) Filed Weights   05/06/15 0443 05/07/15 0308 05/08/15 0618  Weight: 81.738 kg (180 lb 3.2 oz) 86 kg (189 lb 9.5 oz) 80.015 kg (176 lb 6.4 oz)   Body mass index is 25.31 kg/(m^2).   Gen Exam: Awake and alert with clear speech.  Neck: Supple, No JVD.   Chest: B/L Clear.   CVS: S1 S2 Regular, no murmurs.  Abdomen: soft, BS +, non tender, non distended.  Extremities: no edema, lower extremities warm to touch. Neurologic: Non Focal.   Skin: No Rash.   Wounds: N/A.    Intake/Output from previous day:  Intake/Output Summary (Last 24 hours) at 05/08/15 1008 Last data filed at 05/08/15 0700  Gross per 24 hour  Intake    600 ml  Output    475 ml  Net    125 ml     LAB RESULTS: CBC  Recent Labs Lab 05/03/15 2359  05/05/15 0522 05/06/15 0204 05/06/15 1318 05/07/15 0305 05/08/15 0320  WBC 10.8*  < > 13.6* 12.7* 13.2* 12.7* 12.6*  HGB 10.4*  < > 9.7* 9.5* 11.3* 8.7* 8.6*  HCT 32.5*  < > 30.3* 29.6* 34.9* 26.1* 26.9*  PLT 237  < > 229 195 222 205 229  MCV 91.8  < > 91.0 88.6 88.6 89.1 88.2  MCH 29.4  < > 29.1 28.4 28.7 29.7 28.2  MCHC 32.0  < > 32.0 32.1 32.4 33.3 32.0  RDW 13.3  < >  13.2 13.1 13.4 13.4 13.4  LYMPHSABS 1.6  --   --   --   --   --   --   MONOABS 0.9  --   --   --   --   --   --   EOSABS 0.2  --   --   --   --   --   --   BASOSABS 0.0  --   --   --   --   --   --   < > = values in this interval not displayed.  Chemistries   Recent Labs Lab 05/03/15 2359 05/04/15 1050 05/05/15 0522 05/06/15 1318 05/07/15 0305 05/08/15 0320  NA 142 139 134*  --  135 137  K 4.1 3.8 4.5  --  4.5 4.8  CL 107 109 105  --  104 105  CO2 23 22 21*  --  23 23  GLUCOSE 101* 101* 140*  --  129* 138*  BUN 24* 15 12  --  13 13  CREATININE 1.23 0.86 0.82 0.86 0.85 0.94  CALCIUM 9.1 8.3* 8.2*  --  8.2* 8.2*    CBG:  Recent Labs Lab 05/04/15 0011  GLUCAP 101*    GFR Estimated Creatinine Clearance: 74.4 mL/min (by C-G formula based on Cr of 0.94).  Coagulation profile  Recent Labs Lab 05/06/15 0204  INR 1.26    Cardiac Enzymes  Recent Labs Lab 05/04/15 1050 05/04/15 2210 05/05/15 1525  TROPONINI 0.09* 0.21* 0.07*    Invalid input(s): POCBNP No results for input(s): DDIMER in the last 72 hours. No results for input(s): HGBA1C in the last 72 hours. No results for input(s): CHOL, HDL, LDLCALC, TRIG, CHOLHDL, LDLDIRECT in the last 72 hours. No results for input(s): TSH, T4TOTAL, T3FREE, THYROIDAB in the last 72 hours.  Invalid input(s): FREET3 No results for input(s): VITAMINB12, FOLATE, FERRITIN, TIBC, IRON, RETICCTPCT in the last 72 hours. No results for input(s): LIPASE, AMYLASE in the last 72 hours.  Urine Studies No results for input(s): UHGB, CRYS in the last 72 hours.  Invalid input(s): UACOL, UAPR, USPG, UPH, UTP, UGL, UKET, UBIL, UNIT, UROB, ULEU, UEPI, UWBC, URBC, UBAC, CAST, UCOM, BILUA  MICROBIOLOGY: Recent Results (from the past 240 hour(s))  Culture, blood (Routine X 2) w Reflex to ID Panel     Status: None (Preliminary result)   Collection Time: 05/04/15 12:00 AM  Result Value Ref Range Status   Specimen Description BLOOD RIGHT  ANTECUBITAL  Final   Special Requests BOTTLES DRAWN AEROBIC ONLY 5ML  Final   Culture   Final    NO GROWTH 3 DAYS Performed at [facility]    Report Status PENDING  Incomplete  Culture, blood (Routine X 2) w Reflex to ID Panel     Status: None (Preliminary result)   Collection Time: 05/04/15  2:58 AM  Result Value Ref Range Status   Specimen Description BLOOD LEFT HAND  Final   Special Requests IN PEDIATRIC BOTTLE 1ML  Final   Culture   Final    NO GROWTH 3 DAYS Performed at [facility]    Report Status PENDING  Incomplete  C difficile quick scan w PCR reflex     Status: None   Collection Time: 05/04/15 10:00 AM  Result Value Ref Range Status   C Diff antigen NEGATIVE NEGATIVE Final   C Diff toxin NEGATIVE NEGATIVE Final   C Diff interpretation Negative for toxigenic C. difficile  Final  Culture, sputum-assessment     Status: None   Collection Time: 05/05/15  7:47 AM  Result Value Ref Range Status   Specimen Description SPUTUM  Final   Special Requests NONE  Final   Sputum evaluation THIS SPECIMEN IS ACCEPTABLE FOR SPUTUM CULTURE  Final   Report Status 05/05/2015 FINAL  Final  Culture, respiratory (NON-Expectorated)     Status: None (Preliminary result)   Collection Time: 05/05/15  7:47 AM  Result Value Ref Range Status   Specimen Description SPUTUM  Final   Special Requests NONE  Final   Gram Stain   Final    FEW WBC PRESENT, PREDOMINANTLY PMN MODERATE SQUAMOUS EPITHELIAL CELLS PRESENT MODERATE GRAM POSITIVE COCCI IN PAIRS AND CHAINS FEW GRAM POSITIVE RODS Performed at [facility]    Culture   Final    Culture reincubated for better growth Performed at [facility]    Report Status PENDING  Incomplete  MRSA PCR Screening     Status: None   Collection Time: 05/05/15 10:20 PM  Result Value Ref Range Status   MRSA by PCR NEGATIVE NEGATIVE Final    Comment:        The GeneXpert MRSA Assay (FDA approved for NASAL specimens only), is  one component of a comprehensive MRSA colonization surveillance program. It is not intended to diagnose MRSA infection nor to guide or monitor treatment for MRSA infections.     RADIOLOGY STUDIES/RESULTS: Dg Chest 2 View  05/04/2015  CLINICAL DATA:  Acute onset of shortness of breath. Recent fall. Initial encounter. EXAM: CHEST  2 VIEW COMPARISON:  Chest radiograph performed 05/15/2011 FINDINGS: Dense right upper lobe airspace opacification is compatible with pneumonia. The left lung appears clear. No pleural effusion or pneumothorax is seen. The heart remains normal in size. No acute osseous abnormalities are identified. IMPRESSION: Dense right upper lobe airspace opacification, compatible with pneumonia. Followup PA and lateral chest X-ray is recommended in 3-4 weeks following trial of antibiotic therapy to ensure resolution and exclude underlying malignancy. Electronically Signed   By: Garald Balding M.D.   On: 05/04/2015 00:24   Ct Head Wo Contrast  05/05/2015  CLINICAL DATA:  Recent seizure, prior CT showing lung an adrenal mass, hypertension, COPD smoker EXAM: CT HEAD WITHOUT CONTRAST TECHNIQUE: Contiguous axial images were obtained from the base of the skull through the vertex without intravenous contrast. COMPARISON:  06/19/2014 FINDINGS: Generalized atrophy. Normal ventricular morphology. No midline shift or mass effect. Small vessel chronic  ischemic changes of deep cerebral white matter. No intracranial hemorrhage, mass lesion, or acute infarction. Small size of LEFT maxillary sinus with osseous wall thickening and subtotal opacification compatible chronic sinus disease, unchanged. Visualized paranasal sinuses and mastoid air cells otherwise clear. Bones unremarkable. Atherosclerotic calcifications at skullbase. IMPRESSION: Atrophy with small vessel chronic ischemic changes of deep cerebral white matter. No acute intracranial abnormalities. In light of history of mass findings in the RIGHT  lung and LEFT adrenal gland, if there is persistent clinical concern for CNS metastatic disease, recommend MR imaging of the brain with and without contrast followup. Electronically Signed   By: Lavonia Dana M.D.   On: 05/05/2015 16:58   Ct Chest W Contrast  05/04/2015  CLINICAL DATA:  Chest pain and shortness of breath which has progressively worsened over the last few months. Chest pain is exertional, retrosternal and non radiating. Chronic cough for the last 2 months with some productive bloody sputum. Chest x-ray demonstrating right upper lobe pneumonia. EXAM: CT CHEST WITH CONTRAST TECHNIQUE: Multidetector CT imaging of the chest was performed during intravenous contrast administration. CONTRAST:  86m OMNIPAQUE IOHEXOL 300 MG/ML  SOLN COMPARISON:  Chest x-ray dated 05/04/2015. Also chest CT dated 01/17/2009. FINDINGS: There is an oval mass within the inferior segment of the right upper lobe, posteriorly, with thick enhancing wall circumferentially, measuring 7 x 5.4 x 8.4 cm (AP by transverse by craniocaudal dimensions), predominantly fluid density centrally, containing small foci of air at its upper aspect. There is extensive interstitial thickening throughout the inferior aspects of the right upper lobe, centered at and just below the level of this mass. Mild atelectasis versus fibrosis at the right lung base. Oval circumscribed nodule within the lingula is not significantly changed compared to the previous study of 2010, compatible with benign granuloma. Left lung otherwise clear. Trachea and central bronchi unremarkable. Heart size is normal. No pericardial effusion. Coronary artery calcifications noted. Scattered atherosclerotic calcifications seen along the walls of the normal-caliber thoracic aorta. No aortic aneurysm or dissection. Left adrenal mass measures at least 6.3 x 5.8 cm, with CT density measurements ranging from 15-20 Hounsfield units, incompletely imaged at the lower portion of this exam,  new compared to the previous chest CT of 01/17/2009. Limited images of the upper abdomen are otherwise unremarkable. No osseous abnormality appreciated. IMPRESSION: 1. Oval mass within the inferior segment of the right upper lobe, with thick enhancing walls circumferentially, measuring 7 x 5.4 x 8.4 cm, predominantly fluid density centrally, containing small foci of air at its upper aspect. There is extensive surrounding interstitial thickening within the right upper lobe. This may represent a necrotizing pneumonia with associated lung abscess. However, given the additional suspicious left adrenal mass (see below), this is probably more likely to represent a neoplastic lung mass with central cavitation/necrosis. The surrounding interstitial thickening within the right upper lobe could be associated edema, pneumonia or hemorrhage. 2. Large left adrenal mass measuring at least 6.3 x 5.8 cm, incompletely imaged at the lower portion of the exam, new compared to the previous chest CT of 01/17/2009. 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 3. Heart size is normal. Coronary artery calcifications noted, particularly dense at the origin of the LAD. Recommend correlation with any possible associated cardiac symptoms. These results were called by telephone at the time of interpretation on 05/04/2015 at 5:25 pm to Dr. MJanece Canterbury, who verbally acknowledged these results. Electronically Signed   By: SRoxy HorsemanD.  On: 05/04/2015 17:29   Ct Abdomen Pelvis W Contrast  05/05/2015  CLINICAL DATA:  Right upper lobe lung mass. Left adrenal mass partially visualized on chest CT. EXAM: CT ABDOMEN AND PELVIS WITH CONTRAST TECHNIQUE: Multidetector CT imaging of the abdomen and pelvis was performed using the standard protocol following bolus administration of intravenous contrast. CONTRAST:  144m OMNIPAQUE IOHEXOL 300 MG/ML SOLN, 233mOMNIPAQUE IOHEXOL 300 MG/ML SOLN COMPARISON:  05/04/2015  chest CT.  01/17/2009 chest CT. FINDINGS: Lower chest: Subsegmental atelectasis in the basilar lower lobes. Hepatobiliary: There are 2 subcentimeter hypodense liver lesions, too small to characterize, the more posterior of which is stable since 01/17/2009 and benign, and the other of which was not definitely seen on the 01/17/2009 chest CT. There are a few calcified gallstones in the gallbladder measuring up to 8 mm in size, with no gallbladder wall thickening or pericholecystic fluid. No biliary ductal dilatation. Pancreas: Normal, with no mass or duct dilation. Spleen: Normal size. No mass. Adrenals/Urinary Tract: Normal right adrenal. There is a 6.2 x 6.2 x 7.5 cm left adrenal mass with heterogeneous internal hypodensity, including areas of indeterminate density up to 27 HU inferiorly within the mass. This portion of the abdomen was not included on the 01/17/2009 chest CT. No hydronephrosis. There are a few scattered subcentimeter hypodense renal lesions in both kidneys, right greater the left, too small to characterize. Normal bladder, which contains excreted IV contrast from the chest CT study from 1 day prior. Stomach/Bowel: Grossly normal stomach. Normal caliber small bowel with no small bowel wall thickening. Normal appendix. Normal large bowel with no diverticulosis, large bowel wall thickening or pericolonic fat stranding. Vascular/Lymphatic: Atherosclerotic nonaneurysmal abdominal aorta. Patent portal, splenic, hepatic and renal veins. No pathologically enlarged lymph nodes in the abdomen or pelvis. Reproductive: Normal size prostate and seminal vesicles. Other: No pneumoperitoneum, ascites or focal fluid collection. Musculoskeletal: No aggressive appearing focal osseous lesions. Unilateral right L5 pars interarticularis defect. 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 Probable small fat containing bilateral inguinal hernias. IMPRESSION: 1. Indeterminate 7.5 cm left adrenal mass, suspicious for a left  adrenal metastasis given the large size and given the right lung mass. Recommend PET-CT for further evaluation of the chest and adrenal findings. 2. Subcentimeter hypodense liver lesion, too small to characterize. Recommend follow-up CT abdomen with IV contrast in 6 months. This recommendation follows ACR consensus guidelines: Managing Incidental Findings on Abdominal CT: White Paper of the ACR Incidental Findings Committee. J Am Coll Radiol 2010;7:754-773 3. Cholelithiasis. 4. 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 Electronically Signed   By: JaIlona Sorrel.D.   On: 05/05/2015 09:49   Dg Chest Port 1 View  05/04/2015  CLINICAL DATA:  Dyspnea.  Hypoxia. EXAM: PORTABLE CHEST 1 VIEW COMPARISON:  05/03/2015 FINDINGS: Cardiomediastinal silhouette is normal. Mediastinal contours appear intact. There is no evidence of pleural effusion or pneumothorax. Dense right upper lobe airspace consolidation persists. Reticular interstitial opacities involve the remainder of the right upper lobe. Osseous structures are without acute abnormality. Soft tissues are grossly normal. IMPRESSION: Persistent dense right upper lobe airspace consolidation. Differential diagnosis includes pulmonary mass, pulmonary hemorrhage or infectious or post obstructive consolidation. If patient does not present clinically as having pneumonia, cross-sectional imaging should be considered to exclude underlying malignancy. Electronically Signed   By: DoFidela Salisbury.D.   On: 05/04/2015 14:19   Dg Abd Portable 1v  05/04/2015  CLINICAL DATA:  Abdominal distension EXAM: PORTABLE ABDOMEN - 1 VIEW  COMPARISON:  07/24/2009 FINDINGS: Scattered large and small bowel gas is noted. No free air is seen. Diffuse vascular calcifications are noted. No acute bony abnormality seen. IMPRESSION: No acute abnormality noted. Electronically Signed   By: Inez Catalina M.D.   On: 05/04/2015 10:02    Oren Binet, MD  [facility] Pager:[phone]  If 7PM-7AM, please contact night-coverage [email or web address] Password [email or web address] 05/08/2015, 10:08 AM   LOS: 4 days

## 2015-05-09 ENCOUNTER — Telehealth: Payer: Self-pay | Admitting: Physician Assistant

## 2015-05-09 LAB — CULTURE, BLOOD (ROUTINE X 2)
CULTURE: NO GROWTH
Culture: NO GROWTH

## 2015-05-09 LAB — UIFE/LIGHT CHAINS/TP QN, 24-HR UR
% BETA, Urine: 30.5 %
ALPHA 1 URINE: 4.3 %
ALPHA 2 UR: 10.9 %
Albumin, U: 28.1 %
FREE LAMBDA LT CHAINS, UR: 14.3 mg/L — AB (ref 0.24–6.66)
Free Kappa/Lambda Ratio: 21.89 — ABNORMAL HIGH (ref 2.04–10.37)
Free Lt Chn Excr Rate: 313 mg/L — ABNORMAL HIGH (ref 1.35–24.19)
GAMMA GLOBULIN URINE: 26.1 %
TOTAL PROTEIN, URINE-UPE24: 18.3 mg/dL
Time: 24 hours
VOLUME, URINE-UPE24: 790 mL

## 2015-05-09 LAB — CBC
HEMATOCRIT: 29.1 % — AB (ref 39.0–52.0)
Hemoglobin: 9.4 g/dL — ABNORMAL LOW (ref 13.0–17.0)
MCH: 29.5 pg (ref 26.0–34.0)
MCHC: 32.3 g/dL (ref 30.0–36.0)
MCV: 91.2 fL (ref 78.0–100.0)
Platelets: 194 10*3/uL (ref 150–400)
RBC: 3.19 MIL/uL — ABNORMAL LOW (ref 4.22–5.81)
RDW: 13.8 % (ref 11.5–15.5)
WBC: 11.8 10*3/uL — ABNORMAL HIGH (ref 4.0–10.5)

## 2015-05-09 MED ORDER — OXYCODONE HCL 5 MG PO TABS: 5.0000 mg | ORAL_TABLET | Freq: Four times a day (QID) | ORAL | Status: DC | PRN

## 2015-05-09 MED ORDER — ASPIRIN 81 MG PO TBEC: 81.0000 mg | DELAYED_RELEASE_TABLET | Freq: Every day | ORAL | Status: AC

## 2015-05-09 MED ORDER — FERROUS SULFATE 325 (65 FE) MG PO TABS: 325.0000 mg | ORAL_TABLET | Freq: Two times a day (BID) | ORAL | Status: AC

## 2015-05-09 MED ORDER — CLOPIDOGREL BISULFATE 75 MG PO TABS: 75.0000 mg | ORAL_TABLET | Freq: Every day | ORAL | Status: DC

## 2015-05-09 MED ORDER — METOPROLOL TARTRATE 25 MG PO TABS: 12.5000 mg | ORAL_TABLET | Freq: Two times a day (BID) | ORAL | Status: DC

## 2015-05-09 MED ORDER — AMOXICILLIN-POT CLAVULANATE 875-125 MG PO TABS: 1.0000 | ORAL_TABLET | Freq: Two times a day (BID) | ORAL | Status: DC

## 2015-05-09 MED ORDER — PHENYTOIN 50 MG PO CHEW: 50.0000 mg | CHEWABLE_TABLET | Freq: Every day | ORAL | Status: DC

## 2015-05-09 MED ORDER — PREDNISONE 10 MG PO TABS: ORAL_TABLET | ORAL | Status: DC

## 2015-05-09 MED ORDER — MAGNESIUM CITRATE PO SOLN
1.0000 | Freq: Once | ORAL | Status: AC
  Administered 2015-05-09: 1 via ORAL
  Filled 2015-05-09: qty 296

## 2015-05-09 MED ORDER — NITROGLYCERIN 0.4 MG SL SUBL: 0.4000 mg | SUBLINGUAL_TABLET | SUBLINGUAL | Status: AC | PRN

## 2015-05-09 MED ORDER — PHENYTOIN SODIUM EXTENDED 100 MG PO CAPS: 300.0000 mg | ORAL_CAPSULE | Freq: Every day | ORAL | Status: AC

## 2015-05-09 MED ORDER — PANTOPRAZOLE SODIUM 40 MG PO TBEC: 40.0000 mg | DELAYED_RELEASE_TABLET | Freq: Two times a day (BID) | ORAL | Status: AC

## 2015-05-09 MED ORDER — BUDESONIDE-FORMOTEROL FUMARATE 160-4.5 MCG/ACT IN AERO: 2.0000 | INHALATION_SPRAY | Freq: Two times a day (BID) | RESPIRATORY_TRACT | Status: DC

## 2015-05-09 MED ORDER — NICOTINE 14 MG/24HR TD PT24: 14.0000 mg | MEDICATED_PATCH | Freq: Every day | TRANSDERMAL | Status: DC

## 2015-05-09 MED ORDER — ATORVASTATIN CALCIUM 80 MG PO TABS: 80.0000 mg | ORAL_TABLET | Freq: Every day | ORAL | Status: DC

## 2015-05-09 MED ORDER — ALBUTEROL SULFATE HFA 108 (90 BASE) MCG/ACT IN AERS: 2.0000 | INHALATION_SPRAY | Freq: Four times a day (QID) | RESPIRATORY_TRACT | Status: AC | PRN

## 2015-05-09 MED ORDER — POLYETHYLENE GLYCOL 3350 17 G PO PACK: 17.0000 g | PACK | Freq: Every day | ORAL | Status: DC

## 2015-05-09 NOTE — Telephone Encounter (Signed)
TCM phone call . Appt on 05/18/15 at 9:30am w/ Tarri Fuller

## 2015-05-09 NOTE — Care Management Note (Addendum)
Case Management Note  Patient Details  Name: Chad Donoghue MRN: [id number] Date of Birth: 07/15/1945  Subjective/Objective:    NSTEMI, PNA, COPD                Action/Plan: NCM spoke to pt and wife, Mardene Celeste at bedside. Wife in home to assist with his care. States his medications are delivered to home from [facility]. Requesting RW and 3n1 for home. Contacted [facility] for DME. Noted pt's sat drop at night. Home Overnight Pulse Oximetry ordered with oxygen. Contacted [facility] DME rep, Mandy to arrange overnight pulse oximetry.   PCP-Dr Reynold Bowen  Expected Discharge Date:  05/09/2015               Expected Discharge Plan:  Home/Self Care  In-House Referral:  NA  Discharge planning Services  CM Consult  Post Acute Care Choice:  NA Choice offered to:  NA  DME Arranged:  3-N-1, Oxygen, Walker rolling DME Agency:  [place]  HH Arranged:  NA HH Agency:  NA  Status of Service:  Completed, signed off  Medicare Important Message Given:  Yes Date Medicare IM Given:    Medicare IM give by:    Date Additional Medicare IM Given:    Additional Medicare Important Message give by:     If discussed at [place] of Stay Meetings, dates discussed:    Additional Comments:  Erenest Rasher, RN 05/09/2015, 2:33 PM

## 2015-05-09 NOTE — Progress Notes (Signed)
Physical Therapy Treatment Patient Details Name: Cristian Moss MRN: [id number] DOB: 04-10-1945 Today's Date: 05/09/2015    History of Present Illness Pt is a 70 y/o M brought to the ED by his wife after having a seizure. 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  Hospital course complicated by worsening substernal pressure and pt underwent PCI to RCA on 05/06/15.  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    PT Comments    Pt performed increased activity and more receptive to participate.  Pt performed increased gait distance and stair training.  Pt negotiated one flight of stair with safe technique.  Discussed with daughter and wife as well as pt, the need for cane to improve balance.  Will inform supervising PT of recommendation for cane at d/c.    Follow Up Recommendations  No PT follow up;Supervision for mobility/OOB     Equipment Recommendations  Cane    Recommendations for Other Services       Precautions / Restrictions Precautions Precautions: Fall Restrictions Weight Bearing Restrictions: No    Mobility  Bed Mobility Overal bed mobility:  (Pt sitting edge of bed on arrival )                Transfers Overall transfer level: Modified independent Equipment used: None Transfers: Sit to/from Stand           General transfer comment: Pt demonstrated good safety with technique.    Ambulation/Gait Ambulation/Gait assistance: Min guard Ambulation Distance (Feet): 200 Feet Assistive device: None (informed family that pt would benefit from use of his cane at home until strength and balance improves.  ) Gait Pattern/deviations: Step-through pattern;Trunk flexed;Staggering left;Staggering right;Wide base of support   Gait velocity interpretation: Below normal speed for age/gender General Gait Details: Pt demonstrated wide BOS with flexed BLEs.  Pt required cues to avoid  use of rails in halls to assess gait without device.  Pt reports he uses furniture at home for ambulation.  Pt educated to use his cane to improve safety.     Stairs Stairs: Yes Stairs assistance: Min guard Stair Management: Two rails Number of Stairs: 12 General stair comments: Pt performed reciprocal pattern ascending/decending with cues for safety and use of rails for technique.    Wheelchair Mobility    Modified Rankin (Stroke Patients Only)       Balance Overall balance assessment: Needs assistance Sitting-balance support: No upper extremity supported;Feet supported Sitting balance-Leahy Scale: Normal     Standing balance support: No upper extremity supported Standing balance-Leahy Scale: Fair                      Cognition Arousal/Alertness: Awake/alert Behavior During Therapy: Flat affect Overall Cognitive Status: Within Functional Limits for tasks assessed                      Exercises      General Comments        Pertinent Vitals/Pain Pain Assessment: No/denies pain    Home Living                      Prior Function            PT Goals (current goals can now be found in the care plan section) Acute Rehab PT Goals Patient Stated Goal: to go walking Potential to Achieve Goals:  Good Progress towards PT goals: Progressing toward goals    Frequency  Min 3X/week    PT Plan      Co-evaluation             End of Session Equipment Utilized During Treatment: Gait belt Activity Tolerance: Patient limited by fatigue Patient left:  (Pt left on commode RN aware.  )     Time: 6803-2122 PT Time Calculation (min) (ACUTE ONLY): 12 min  Charges:  $Gait Training: 8-22 mins                    G Codes:      [name] 06-Jun-2015, 12:54 PM Governor Rooks, PTA pager [phone]

## 2015-05-09 NOTE — Discharge Instructions (Signed)
Patient unable to drive, operate heavy machinery, perform activities at heights and participate in water activities until release by outpatient physician. Coronary Angiogram With Stent, Care After Refer to this sheet in the next few weeks. These instructions provide you with information about caring for yourself after your procedure. Your health care provider may also give you more specific instructions. Your treatment has been planned according to current medical practices, but problems sometimes occur. Call your health care provider if you have any problems or questions after your procedure. WHAT TO EXPECT AFTER THE PROCEDURE  After your procedure, it is typical to have the following:  Bruising at the catheter insertion site that usually fades within 1-2 weeks.  Blood collecting in the tissue (hematoma) that may be painful to the touch. It should usually decrease in size and tenderness within 1-2 weeks. HOME CARE INSTRUCTIONS  Take medicines only as directed by your health care provider. Blood thinners may be prescribed after your procedure to improve blood flow through the stent.  You may shower 24-48 hours after the procedure or as directed by your health care provider. Remove the bandage (dressing) and gently wash the catheter insertion site with plain soap and water. Pat the area dry with a clean towel. Do not rub the site, because this may cause bleeding.  Do not take baths, swim, or use a hot tub until your health care provider approves.  Check your catheter insertion site every day for redness, swelling, or drainage.  Do not apply powder or lotion to the site.  Do not lift over 10 lb (4.5 kg) for 5 days after your procedure or as directed by your health care provider.  Ask your health care provider when it is okay to:  Return to work or school.  Resume usual physical activities or sports.  Resume sexual activity.  Eat a heart-healthy diet. This should include plenty of fresh  fruits and vegetables. Meat should be lean cuts. Avoid the following types of food:  Food that is high in salt.  Canned or highly processed food.  Food that is high in saturated fat or sugar.  Fried food.  Make any other lifestyle changes as recommended by your health care provider. These may include:  Not using any tobacco products, including cigarettes, chewing tobacco, or electronic cigarettes.If you need help quitting, ask your health care provider.  Managing your weight.  Getting regular exercise.  Managing your blood pressure.  Limiting your alcohol intake.  Managing other health problems, such as diabetes.  If you need an MRI after your heart stent has been placed, be sure to tell the health care provider who orders the MRI that you have a heart stent.  Keep all follow-up visits as directed by your health care provider. This is important. SEEK MEDICAL CARE IF:  You have a fever.  You have chills.  You have increased bleeding from the catheter insertion site. Hold pressure on the site. SEEK IMMEDIATE MEDICAL CARE IF:  You develop chest pain or shortness of breath, feel faint, or pass out.  You have unusual pain at the catheter insertion site.  You have redness, warmth, or swelling at the catheter insertion site.  You have drainage (other than a small amount of blood on the dressing) from the catheter insertion site.  The catheter insertion site is bleeding, and the bleeding does not stop after 30 minutes of holding steady pressure on the site.  You develop bleeding from any other place, such as from your  rectum. There may be bright red blood in your urine or stool, or it may appear as black, tarry stool.   This information is not intended to replace advice given to you by your health care provider. Make sure you discuss any questions you have with your health care provider.   Document Released: 09/29/2004 Document Revised: 04/02/2014 Document Reviewed:  08/04/2012 Elsevier Interactive Patient Education [employer].

## 2015-05-09 NOTE — Consult Note (Signed)
PULMONARY / CRITICAL CARE MEDICINE   Name: Cristian Moss MRN: [id number] DOB: 1945/11/17    ADMISSION DATE:  05/03/2015 CONSULTATION DATE:  05/09/15  REFERRING MD:  Nena Alexander  CHIEF COMPLAINT:  Seizures, Rt lung mass  HISTORY OF PRESENT ILLNESS:   Cristian Moss is a 70 year old with lead poisoning, seizure disorder. Admitted on 2/8 with seizures. He was also evaluated by cardiology for chest pain, elevated cardiac enzymes. He received a drug-eluting stent to RCA on 2/11. He was found to have right lung capacity and an adrenal lesion concerning for malignancy. PCCM asked to evaluate.  PAST MEDICAL HISTORY :  He  has a past medical history of Essential hypertension; Seizures ([place]); COPD (chronic obstructive pulmonary disease) ([place]); Thrombocytopenia ([place]); History of lead exposure; BPH (benign prostatic hypertrophy); Erectile dysfunction; Empty sella syndrome ([place]); CKD (chronic kidney disease), stage III; Hyponatremia; and Tobacco abuse.  PAST SURGICAL HISTORY: He  has past surgical history that includes Amputation of left 5th Finger; Cardiac catheterization (N/A, 05/06/2015); and Cardiac catheterization (N/A, 05/06/2015).  No Known Allergies  No current facility-administered medications on file prior to encounter.   Current Outpatient Prescriptions on File Prior to Encounter  Medication Sig  . bromocriptine (PARLODEL) 2.5 MG tablet Take 2.5 mg by mouth 2 (two) times daily.   Marland Kitchen levETIRAcetam (KEPPRA) 750 MG tablet Take 1,500 mg by mouth every 12 (twelve) hours.  . phenytoin (DILANTIN) 100 MG ER capsule Take 3 capsules (300 mg total) by mouth at bedtime.  Marland Kitchen lacosamide (VIMPAT) 50 MG TABS tablet Take 1 tablet (50 mg total) by mouth 2 (two) times daily. (Patient not taking: Reported on 05/03/2015)    FAMILY HISTORY:  His has no family status information on file.   SOCIAL HISTORY: He  reports that he has been smoking Cigarettes.  He has a 23.5 pack-year smoking history. He has never used  smokeless tobacco. He reports that he does not drink alcohol or use illicit drugs.  REVIEW OF SYSTEMS:   Denies any cough, sputum production, hemoptysis  SUBJECTIVE:    VITAL SIGNS: BP 113/67 mmHg  Pulse 73  Temp(Src) 98.2 F (36.8 C) (Oral)  Resp 22  Ht '5\' 10"'$  (1.778 m)  Wt 165 lb 9.1 oz (75.1 kg)  BMI 23.76 kg/m2  SpO2 92%  HEMODYNAMICS:    VENTILATOR SETTINGS:    INTAKE / OUTPUT: I/O last 3 completed shifts: In: 723 [P.O.:717; I.V.:6] Out: 650 [Urine:650]  PHYSICAL EXAMINATION: General:  Awake, alert, oriented Neuro:  No gross focal deficits HEENT:  Moist mucous membranes, Cardiovascular: Regular rate and rhythm, no murmurs rubs gallops Lungs: Clear to auscultation Abdomen:  Soft, positive bowel sounds Skin:  Intact  LABS:  BMET  Recent Labs Lab 05/05/15 0522 05/06/15 1318 05/07/15 0305 05/08/15 0320  NA 134*  --  135 137  K 4.5  --  4.5 4.8  CL 105  --  104 105  CO2 21*  --  23 23  BUN 12  --  13 13  CREATININE 0.82 0.86 0.85 0.94  GLUCOSE 140*  --  129* 138*    Electrolytes  Recent Labs Lab 05/05/15 0522 05/07/15 0305 05/08/15 0320  CALCIUM 8.2* 8.2* 8.2*    CBC  Recent Labs Lab 05/07/15 0305 05/08/15 0320 05/09/15 0445  WBC 12.7* 12.6* 11.8*  HGB 8.7* 8.6* 9.4*  HCT 26.1* 26.9* 29.1*  PLT 205 229 194    Coag's  Recent Labs Lab 05/06/15 0204  INR 1.26    Sepsis Markers No results  for input(s): LATICACIDVEN, PROCALCITON, O2SATVEN in the last 168 hours.  ABG No results for input(s): PHART, PCO2ART, PO2ART in the last 168 hours.  Liver Enzymes  Recent Labs Lab 05/05/15 0522 05/07/15 0305 05/08/15 0320  AST 21 15 13*  ALT 10* 12* 11*  ALKPHOS 89 79 76  BILITOT 0.3 0.3 0.2*  ALBUMIN 2.6* 2.2* 2.3*    Cardiac Enzymes  Recent Labs Lab 05/04/15 1050 05/04/15 2210 05/05/15 1525  TROPONINI 0.09* 0.21* 0.07*    Glucose  Recent Labs Lab 05/04/15 0011  GLUCAP 101*    Imaging No results  found.   STUDIES:  CT scan chest 2/8 >> only minus in the right upper lobe, anterior segment. No lymphadenopathy CT abdomen 2/9 >> left adrenal mass  CULTURES:   ANTIBIOTICS:   SIGNIFICANT EVENTS:   LINES/TUBES:   DISCUSSION: 70 year old with extensive smoking history, lead posioning, seizure disorder. Found to have an incidental finding of right upper lobe rounded opacity. This is associated with an adrenal mass concerning for malignancy. The lung lesion could also be a necrotizing pneumonia from aspiration. He does have seizure disorder with last seizure a couple of months ago. He could be silently aspirating.  This is a difficult situation as he is already on aspirin and Plavix for cardiac stent which needs to be continued for 3 months. I don't think just doing a bronchoscopy with just washings and cytology will be high yeild. He will need a tissue biopsy for diagnosis. Ideally the adrenal mass would be the easiest approach with low risk of complication.  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  Reccs: - Continue antibiotics. Augmentin for 4 weeks. - He will need follow up with a repeat CT scan and possible PET as an outpatient after he completes the abx therapy. - He will follow up as an outpatient in the pulmonary clinic and we will reassess and schedule for the imaging studies and refer to IR for the adrenal biopsy.  Marshell Garfinkel MD [facility] Pager [phone] If no answer or after 3pm call: [phone] 05/09/2015, 11:20 AM

## 2015-05-09 NOTE — Progress Notes (Signed)
Patient ID: Cristian Moss, male   DOB: 1945-09-17, 70 y.o.   MRN: [id number]   Request was received for adrenal mass biopsy in IR Okayed per Dr Vernard Gambles  Has since gone to cath lab and received cardiac stent On Plavix for minimal 3 months per notes  Will need off Plavix for procedure of needle biopsy (5 days)  Please re order when timing is appropriate Can be certainly performed as OP

## 2015-05-09 NOTE — Discharge Summary (Addendum)
PATIENT DETAILS Name: Cristian Moss Age: 70 y.o. Sex: male Date of Birth: Sep 06, 1945 MRN: [id number]. Admitting Physician: Rise Patience, MD TGY:[id number] Antony Haste, MD  Admit Date: 05/03/2015 Discharge date: 05/09/2015  Recommendations for Outpatient Follow-up:  1. Complete a total of 4 weeks of antibiotics.  2. Note-new medications-aspirin/Plavix/Crestor/metoprolol-for non-STEMI and PCI 3. Has a newly diagnosed lung mass-although likely malignant, could be necrotizing pneumonia-plans are to complete antibiotics and follow-up with pulmonology for repeat CT chest/PET scan or bronchoscopy with brushings. 4. Please ensure follow-up with pulmonology and cardiology.  5. Please ensure follow-up with neurology. 6. Please repeat CBC/BMET at next visit  PRIMARY DISCHARGE DIAGNOSIS:  Principal Problem:   CAP (community acquired pneumonia) Active Problems:   Essential hypertension   EMPHYSEMA   Seizure ([place])   Elevated troponin   Diarrhea   Normocytic anemia   Tobacco abuse   CKD (chronic kidney disease), stage III   Cavitating mass in right upper lung lobe   Left adrenal mass (HCC)   Hemoptysis   Coronary artery calcification seen on CAT scan   Abdominal fullness   Adrenal mass (HCC)   Cancer (HCC)   Dyspnea   Lung mass   Unstable angina (HCC)   Abnormal echocardiogram findings without diagnosis   Abnormal finding on EKG - evolving   NSTEMI (non-ST elevated myocardial infarction) ([place])      PAST MEDICAL HISTORY: Past Medical History  Diagnosis Date  . Essential hypertension   . Seizures ([place])   . COPD (chronic obstructive pulmonary disease) ([place])   . Thrombocytopenia ([place])   . 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 BPH (benign prostatic hypertrophy)   . Erectile dysfunction   . Empty sella syndrome ([place])     History of  . CKD (chronic kidney disease), stage III   . Hyponatremia     history of  . Tobacco abuse     DISCHARGE MEDICATIONS: Current Discharge Medication  List    START taking these medications   Details  albuterol (PROVENTIL HFA;VENTOLIN HFA) 108 (90 Base) MCG/ACT inhaler Inhale 2 puffs into the lungs every 6 (six) hours as needed for wheezing or shortness of breath. Qty: 1 Inhaler, Refills: 0    amoxicillin-clavulanate (AUGMENTIN) 875-125 MG tablet Take 1 tablet by mouth every 12 (twelve) hours. Qty: 48 tablet, Refills: 0    aspirin EC 81 MG EC tablet Take 1 tablet (81 mg total) by mouth daily.    atorvastatin (LIPITOR) 80 MG tablet Take 1 tablet (80 mg total) by mouth daily at 6 PM. Qty: 30 tablet, Refills: 0    budesonide-formoterol (SYMBICORT) 160-4.5 MCG/ACT inhaler Inhale 2 puffs into the lungs 2 (two) times daily. Qty: 1 Inhaler, Refills: 0    clopidogrel (PLAVIX) 75 MG tablet Take 1 tablet (75 mg total) by mouth daily with breakfast. Qty: 30 tablet, Refills: 0    ferrous sulfate 325 (65 FE) MG tablet Take 1 tablet (325 mg total) by mouth 2 (two) times daily with a meal. Qty: 60 tablet, Refills: 0    metoprolol tartrate (LOPRESSOR) 25 MG tablet Take 0.5 tablets (12.5 mg total) by mouth 2 (two) times daily. Qty: 60 tablet, Refills: 0    nicotine (NICODERM CQ) 14 mg/24hr patch Place 1 patch (14 mg total) onto the skin daily. Qty: 28 patch, Refills: 0    nitroGLYCERIN (NITROSTAT) 0.4 MG SL tablet Place 1 tablet (0.4 mg total) under the tongue every 5 (five) minutes as needed for chest pain. FOR 3 DOSES ONLY Qty:  30 tablet, Refills: 0    oxyCODONE (OXY IR/ROXICODONE) 5 MG immediate release tablet Take 1 tablet (5 mg total) by mouth every 6 (six) hours as needed for moderate pain. Qty: 30 tablet, Refills: 0    pantoprazole (PROTONIX) 40 MG tablet Take 1 tablet (40 mg total) by mouth 2 (two) times daily. Qty: 60 tablet, Refills: 0    phenytoin (DILANTIN) 50 MG tablet Chew 1 tablet (50 mg total) by mouth at bedtime. Along with 300 mg-for a total of 350 mg daily Qty: 30 tablet, Refills: 0    polyethylene glycol (MIRALAX)  packet Take 17 g by mouth daily. Qty: 14 each, Refills: 0    predniSONE (DELTASONE) 10 MG tablet Take 4 tablets (40 mg) daily for 2 days, then, Take 3 tablets (30 mg) daily for 2 days, then, Take 2 tablets (20 mg) daily for 2 days, then, Take 1 tablets (10 mg) daily for 1 days, then stop Qty: 19 tablet, Refills: 0      CONTINUE these medications which have CHANGED   Details  phenytoin (DILANTIN) 100 MG ER capsule Take 3 capsules (300 mg total) by mouth at bedtime. Qty: 90 capsule, Refills: 0      CONTINUE these medications which have NOT CHANGED   Details  bromocriptine (PARLODEL) 2.5 MG tablet Take 2.5 mg by mouth 2 (two) times daily.     levETIRAcetam (KEPPRA) 750 MG tablet Take 1,500 mg by mouth every 12 (twelve) hours.    losartan (COZAAR) 50 MG tablet Take 50 mg by mouth daily. Refills: 0      STOP taking these medications     lacosamide (VIMPAT) 50 MG TABS tablet         ALLERGIES:  No Known Allergies  BRIEF HPI:  See H&P, Labs, Consult and Test reports for all details in brief, 70 year old male with history of epilepsy, hypertension, and COPD who presented to the emergency department after his wife witnessed a five-minute staring spell concerning for seizure.He also endorsed chest pain and shortness of breath which had been progressively worsening over the last few months. Chest x-ray on admission showed a right upper lobe mass, CT chest on 2/8 confirmed a right lung mass which was felt likely to be malignancy.  CONSULTATIONS:   cardiology, pulmonary/intensive care and IR  PERTINENT RADIOLOGIC STUDIES: Dg Chest 2 View  05/04/2015  CLINICAL DATA:  Acute onset of shortness of breath. Recent fall. Initial encounter. EXAM: CHEST  2 VIEW COMPARISON:  Chest radiograph performed 05/15/2011 FINDINGS: Dense right upper lobe airspace opacification is compatible with pneumonia. The left lung appears clear. No pleural effusion or pneumothorax is seen. The heart remains normal in  size. No acute osseous abnormalities are identified. IMPRESSION: Dense right upper lobe airspace opacification, compatible with pneumonia. Followup PA and lateral chest X-ray is recommended in 3-4 weeks following trial of antibiotic therapy to ensure resolution and exclude underlying malignancy. Electronically Signed   By: Garald Balding M.D.   On: 05/04/2015 00:24   Ct Head Wo Contrast  05/05/2015  CLINICAL DATA:  Recent seizure, prior CT showing lung an adrenal mass, hypertension, COPD smoker EXAM: CT HEAD WITHOUT CONTRAST TECHNIQUE: Contiguous axial images were obtained from the base of the skull through the vertex without intravenous contrast. COMPARISON:  06/19/2014 FINDINGS: Generalized atrophy. Normal ventricular morphology. No midline shift or mass effect. Small vessel chronic ischemic changes of deep cerebral white matter. No intracranial hemorrhage, mass lesion, or acute infarction. Small size of LEFT maxillary  sinus with osseous wall thickening and subtotal opacification compatible chronic sinus disease, unchanged. Visualized paranasal sinuses and mastoid air cells otherwise clear. Bones unremarkable. Atherosclerotic calcifications at skullbase. IMPRESSION: Atrophy with small vessel chronic ischemic changes of deep cerebral white matter. No acute intracranial abnormalities. In light of history of mass findings in the RIGHT lung and LEFT adrenal gland, if there is persistent clinical concern for CNS metastatic disease, recommend MR imaging of the brain with and without contrast followup. Electronically Signed   By: Lavonia Dana M.D.   On: 05/05/2015 16:58   Ct Chest W Contrast  05/04/2015  CLINICAL DATA:  Chest pain and shortness of breath which has progressively worsened over the last few months. Chest pain is exertional, retrosternal and non radiating. Chronic cough for the last 2 months with some productive bloody sputum. Chest x-ray demonstrating right upper lobe pneumonia. EXAM: CT CHEST WITH  CONTRAST TECHNIQUE: Multidetector CT imaging of the chest was performed during intravenous contrast administration. CONTRAST:  97m OMNIPAQUE IOHEXOL 300 MG/ML  SOLN COMPARISON:  Chest x-ray dated 05/04/2015. Also chest CT dated 01/17/2009. FINDINGS: There is an oval mass within the inferior segment of the right upper lobe, posteriorly, with thick enhancing wall circumferentially, measuring 7 x 5.4 x 8.4 cm (AP by transverse by craniocaudal dimensions), predominantly fluid density centrally, containing small foci of air at its upper aspect. There is extensive interstitial thickening throughout the inferior aspects of the right upper lobe, centered at and just below the level of this mass. Mild atelectasis versus fibrosis at the right lung base. Oval circumscribed nodule within the lingula is not significantly changed compared to the previous study of 2010, compatible with benign granuloma. Left lung otherwise clear. Trachea and central bronchi unremarkable. Heart size is normal. No pericardial effusion. Coronary artery calcifications noted. Scattered atherosclerotic calcifications seen along the walls of the normal-caliber thoracic aorta. No aortic aneurysm or dissection. Left adrenal mass measures at least 6.3 x 5.8 cm, with CT density measurements ranging from 15-20 Hounsfield units, incompletely imaged at the lower portion of this exam, new compared to the previous chest CT of 01/17/2009. Limited images of the upper abdomen are otherwise unremarkable. No osseous abnormality appreciated. IMPRESSION: 1. Oval mass within the inferior segment of the right upper lobe, with thick enhancing walls circumferentially, measuring 7 x 5.4 x 8.4 cm, predominantly fluid density centrally, containing small foci of air at its upper aspect. There is extensive surrounding interstitial thickening within the right upper lobe. This may represent a necrotizing pneumonia with associated lung abscess. However, given the additional  suspicious left adrenal mass (see below), this is probably more likely to represent a neoplastic lung mass with central cavitation/necrosis. The surrounding interstitial thickening within the right upper lobe could be associated edema, pneumonia or hemorrhage. 2. Large left adrenal mass measuring at least 6.3 x 5.8 cm, incompletely imaged at the lower portion of the exam, new compared to the previous chest CT of 01/17/2009. 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 3. Heart size is normal. Coronary artery calcifications noted, particularly dense at the origin of the LAD. Recommend correlation with any possible associated cardiac symptoms. These results were called by telephone at the time of interpretation on 05/04/2015 at 5:25 pm to Dr. MJanece Canterbury, who verbally acknowledged these results. Electronically Signed   By: SFranki CabotM.D.   On: 05/04/2015 17:29   Ct Abdomen Pelvis W Contrast  05/05/2015  CLINICAL DATA:  Right upper lobe  lung mass. Left adrenal mass partially visualized on chest CT. EXAM: CT ABDOMEN AND PELVIS WITH CONTRAST TECHNIQUE: Multidetector CT imaging of the abdomen and pelvis was performed using the standard protocol following bolus administration of intravenous contrast. CONTRAST:  134m OMNIPAQUE IOHEXOL 300 MG/ML SOLN, 23mOMNIPAQUE IOHEXOL 300 MG/ML SOLN COMPARISON:  05/04/2015 chest CT.  01/17/2009 chest CT. FINDINGS: Lower chest: Subsegmental atelectasis in the basilar lower lobes. Hepatobiliary: There are 2 subcentimeter hypodense liver lesions, too small to characterize, the more posterior of which is stable since 01/17/2009 and benign, and the other of which was not definitely seen on the 01/17/2009 chest CT. There are a few calcified gallstones in the gallbladder measuring up to 8 mm in size, with no gallbladder wall thickening or pericholecystic fluid. No biliary ductal dilatation. Pancreas: Normal, with no mass or duct dilation. Spleen: Normal  size. No mass. Adrenals/Urinary Tract: Normal right adrenal. There is a 6.2 x 6.2 x 7.5 cm left adrenal mass with heterogeneous internal hypodensity, including areas of indeterminate density up to 27 HU inferiorly within the mass. This portion of the abdomen was not included on the 01/17/2009 chest CT. No hydronephrosis. There are a few scattered subcentimeter hypodense renal lesions in both kidneys, right greater the left, too small to characterize. Normal bladder, which contains excreted IV contrast from the chest CT study from 1 day prior. Stomach/Bowel: Grossly normal stomach. Normal caliber small bowel with no small bowel wall thickening. Normal appendix. Normal large bowel with no diverticulosis, large bowel wall thickening or pericolonic fat stranding. Vascular/Lymphatic: Atherosclerotic nonaneurysmal abdominal aorta. Patent portal, splenic, hepatic and renal veins. No pathologically enlarged lymph nodes in the abdomen or pelvis. Reproductive: Normal size prostate and seminal vesicles. Other: No pneumoperitoneum, ascites or focal fluid collection. Musculoskeletal: No aggressive appearing focal osseous lesions. Unilateral right L5 pars interarticularis defect. 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 Probable small fat containing bilateral inguinal hernias. IMPRESSION: 1. Indeterminate 7.5 cm left adrenal mass, suspicious for a left adrenal metastasis given the large size and given the right lung mass. Recommend PET-CT for further evaluation of the chest and adrenal findings. 2. Subcentimeter hypodense liver lesion, too small to characterize. Recommend follow-up CT abdomen with IV contrast in 6 months. This recommendation follows ACR consensus guidelines: Managing Incidental Findings on Abdominal CT: White Paper of the ACR Incidental Findings Committee. J Am Coll Radiol 2010;7:754-773 3. Cholelithiasis. 4. 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 Electronically Signed    By: JaIlona Sorrel.D.   On: 05/05/2015 09:49   Dg Chest Port 1 View  05/04/2015  CLINICAL DATA:  Dyspnea.  Hypoxia. EXAM: PORTABLE CHEST 1 VIEW COMPARISON:  05/03/2015 FINDINGS: Cardiomediastinal silhouette is normal. Mediastinal contours appear intact. There is no evidence of pleural effusion or pneumothorax. Dense right upper lobe airspace consolidation persists. Reticular interstitial opacities involve the remainder of the right upper lobe. Osseous structures are without acute abnormality. Soft tissues are grossly normal. IMPRESSION: Persistent dense right upper lobe airspace consolidation. Differential diagnosis includes pulmonary mass, pulmonary hemorrhage or infectious or post obstructive consolidation. If patient does not present clinically as having pneumonia, cross-sectional imaging should be considered to exclude underlying malignancy. Electronically Signed   By: DoFidela Salisbury.D.   On: 05/04/2015 14:19   Dg Abd Portable 1v  05/04/2015  CLINICAL DATA:  Abdominal distension EXAM: PORTABLE ABDOMEN - 1 VIEW COMPARISON:  07/24/2009 FINDINGS: Scattered large and small bowel gas is noted. No free air is seen. Diffuse vascular  calcifications are noted. No acute bony abnormality seen. IMPRESSION: No acute abnormality noted. Electronically Signed   By: Inez Catalina M.D.   On: 05/04/2015 10:02     PERTINENT LAB RESULTS: CBC:  Recent Labs  05/08/15 0320 05/09/15 0445  WBC 12.6* 11.8*  HGB 8.6* 9.4*  HCT 26.9* 29.1*  PLT 229 194   CMET CMP     Component Value Date/Time   NA 137 05/08/2015 0320   K 4.8 05/08/2015 0320   CL 105 05/08/2015 0320   CO2 23 05/08/2015 0320   GLUCOSE 138* 05/08/2015 0320   BUN 13 05/08/2015 0320   CREATININE 0.94 05/08/2015 0320   CALCIUM 8.2* 05/08/2015 0320   PROT 6.2* 05/08/2015 0320   ALBUMIN 2.3* 05/08/2015 0320   AST 13* 05/08/2015 0320   ALT 11* 05/08/2015 0320   ALKPHOS 76 05/08/2015 0320   BILITOT 0.2* 05/08/2015 0320   GFRNONAA >60  05/08/2015 0320   GFRAA >60 05/08/2015 0320    GFR Estimated Creatinine Clearance: 74.4 mL/min (by C-G formula based on Cr of 0.94). No results for input(s): LIPASE, AMYLASE in the last 72 hours. No results for input(s): CKTOTAL, CKMB, CKMBINDEX, TROPONINI in the last 72 hours. Invalid input(s): POCBNP No results for input(s): DDIMER in the last 72 hours. No results for input(s): HGBA1C in the last 72 hours. No results for input(s): CHOL, HDL, LDLCALC, TRIG, CHOLHDL, LDLDIRECT in the last 72 hours. No results for input(s): TSH, T4TOTAL, T3FREE, THYROIDAB in the last 72 hours.  Invalid input(s): FREET3 No results for input(s): VITAMINB12, FOLATE, FERRITIN, TIBC, IRON, RETICCTPCT in the last 72 hours. Coags: No results for input(s): INR in the last 72 hours.  Invalid input(s): PT Microbiology: Recent Results (from the past 240 hour(s))  Culture, blood (Routine X 2) w Reflex to ID Panel     Status: None (Preliminary result)   Collection Time: 05/04/15 12:00 AM  Result Value Ref Range Status   Specimen Description BLOOD RIGHT ANTECUBITAL  Final   Special Requests BOTTLES DRAWN AEROBIC ONLY 5ML  Final   Culture   Final    NO GROWTH 4 DAYS Performed at [facility]    Report Status PENDING  Incomplete  Culture, blood (Routine X 2) w Reflex to ID Panel     Status: None (Preliminary result)   Collection Time: 05/04/15  2:58 AM  Result Value Ref Range Status   Specimen Description BLOOD LEFT HAND  Final   Special Requests IN PEDIATRIC BOTTLE 1ML  Final   Culture   Final    NO GROWTH 4 DAYS Performed at [facility]    Report Status PENDING  Incomplete  C difficile quick scan w PCR reflex     Status: None   Collection Time: 05/04/15 10:00 AM  Result Value Ref Range Status   C Diff antigen NEGATIVE NEGATIVE Final   C Diff toxin NEGATIVE NEGATIVE Final   C Diff interpretation Negative for toxigenic C. difficile  Final  Culture, sputum-assessment     Status: None    Collection Time: 05/05/15  7:47 AM  Result Value Ref Range Status   Specimen Description SPUTUM  Final   Special Requests NONE  Final   Sputum evaluation THIS SPECIMEN IS ACCEPTABLE FOR SPUTUM CULTURE  Final   Report Status 05/05/2015 FINAL  Final  Culture, respiratory (NON-Expectorated)     Status: None   Collection Time: 05/05/15  7:47 AM  Result Value Ref Range Status   Specimen Description SPUTUM  Final   Special Requests NONE  Final   Gram Stain   Final    FEW WBC PRESENT, PREDOMINANTLY PMN MODERATE SQUAMOUS EPITHELIAL CELLS PRESENT MODERATE GRAM POSITIVE COCCI IN PAIRS AND CHAINS FEW GRAM POSITIVE RODS Performed at [facility]    Culture   Final    NORMAL OROPHARYNGEAL FLORA Performed at [facility]    Report Status 05/08/2015 FINAL  Final  MRSA PCR Screening     Status: None   Collection Time: 05/05/15 10:20 PM  Result Value Ref Range Status   MRSA by PCR NEGATIVE NEGATIVE Final    Comment:        The GeneXpert MRSA Assay (FDA approved for NASAL specimens only), is one component of a comprehensive MRSA colonization surveillance program. It is not intended to diagnose MRSA infection nor to guide or monitor treatment for MRSA infections.      BRIEF HOSPITAL COURSE:  Brief Summary: 70 year old male with history of epilepsy, hypertension, and COPD who presented to the emergency department after his wife witnessed a five-minute staring spell concerning for seizure.He also endorsed chest pain and shortness of breath which had been progressively worsening over the last few months. Chest x-ray on admission showed a right upper lobe mass, CT chest on 2/8 confirmed a right lung mass which was felt likely to be malignancy. Unfortunately, hospital course was complicated by worsening substernal pressure, with worsening T wave inversion. Patient was seen by cardiology, transferred to [facility], and subsequently underwent a PCI on 2/10. Currently on dual  antiplatelet agents-and unable to proceed with CT-guided biopsy or bronchoscopy with biopsy. Plans are to continue Augmentin for a total of 4 weeks, and to repeat scans upon follow-up with pulmonology.  Hospital course by problem list: Acute hypoxic respiratory failure: Secondary to COPD exacerbation, lung mass-?  pneumonia. Continue bronchodilators on discharge, will taper steroids and empiric Augmentin (for a total of 4 weeks).  Influenza PCR negative, blood/sputum cultures negative so far, urine negative for Streptococcus/Legionella antigen.  COPD exacerbation: Taper prednisone over one week, continue bronchodilators and empiric antibiotics. Lungs clear on exam without any rhonchi  Non-STEMI: Upon hospitalization-continued to have chest pain and worsening T wave inversions. Seen by cardiology, given severity of chest pain/EKG changes-it was felt that patient required cardiac catheterization with intervention before proceeding with workup of the lung mass. Patient was subsequently transferred to [facility] and underwent PCI to RCA on 2/10. Continue aspirin, Plavix, statin and metoprolol. Spoke with cardiology-Dr Erlinda Hong to discharge, he would arrange for outpatient follow-up.  Chronic systolic heart failure: Clinically compensated, echocardiogram demonstrated mildly reduced EF of around 45%. Continue metoprolol and losartan., start diuretics if volume overloaded.  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 Spoke with Dr. Wallie Renshaw 2/11-although this could be a cavitary malignancy-infection is still in the differential. He suggested we treat with antibiotics, consult PCCM for bronchoscopy with cytology/cultures. A outpatient PET scan could be contemplated once patient completes a course of  antibiotics. Case was subsequently discussed with PCCM- Dr Westley Foots the patient, recommended a total of 4 weeks of Augmentin, he will arrange for outpatient follow-up with him in his office for repeat CT chest or PET scan. He will arrange for biopsy/bronchoscopy after coordination with cardiology. This plan was discussed with both patient, spouse and daughter at bedside, they are agreeable.  Breakthrough seizure-history of seizure disorder: Patient presented with suspected breakthrough seizures-Dr. Sheran Fava spoke with neurology over the phone, recommendations were to increase Dilantin to 350 mg.Continue Keppra 1500 mg twice a day. Please ensure follow-up with neurology  Anemia: Suspect secondary to chronic disease-worsened by acute illness, hemoglobin stable compared to yesterday. Mild hemoptysis continues intermittently, hemoglobin stable at 9.4. Continue to follow closely in the outpatient setting   Hypertension: Currently controlled, continue metoprolol and losartan. Follow.  Tobacco abuse: Counseled.  TODAY-DAY OF DISCHARGE:  Subjective:   [name] today has no headache,no chest abdominal pain,no new weakness tingling or numbness, feels much better wants to go home today.   Objective:   Blood pressure 113/67, pulse 73, temperature 98.2 F (36.8 C), temperature source Oral, resp. rate 22, height '5\' 10"'$  (1.778 m), weight 75.1 kg (165 lb 9.1 oz), SpO2 92 %.  Intake/Output Summary (Last 24 hours) at 05/09/15 1313 Last data filed at 05/09/15 0810  Gross per 24 hour  Intake    240 ml  Output    175 ml  Net     65 ml   Filed Weights   05/07/15 0308 05/08/15 0618 05/09/15 0400  Weight: 86 kg (189 lb 9.5 oz) 80.015 kg (176 lb 6.4 oz) 75.1 kg (165 lb 9.1 oz)    Exam Awake Alert, Oriented *3, No new F.N deficits, Normal affect [place].AT,PERRAL Supple Neck,No JVD, No cervical lymphadenopathy appriciated.  Symmetrical Chest wall movement, Good air movement bilaterally, CTAB RRR,No  Gallops,Rubs or new Murmurs, No Parasternal Heave +ve B.Sounds, Abd Soft, Non tender, No organomegaly appriciated, No rebound -guarding or rigidity. No Cyanosis, Clubbing or edema, No new Rash or bruise  DISCHARGE CONDITION: Stable  DISPOSITION: Home  DISCHARGE INSTRUCTIONS:    Activity:  As tolerated with Full fall precautions use walker/cane & assistance as needed  Get Medicines reviewed and adjusted: Please take all your medications with you for your next visit with your Primary MD  Please request your Primary MD to go over all hospital tests and procedure/radiological results at the follow up, please ask your Primary MD to get all Hospital records sent to his/her office.  If you experience worsening of your admission symptoms, develop shortness of breath, life threatening emergency, suicidal or homicidal thoughts you must seek medical attention immediately by calling 911 or calling your MD immediately  if symptoms less severe.  You must read complete instructions/literature along with all the possible adverse reactions/side effects for all the Medicines you take and that have been prescribed to you. Take any new Medicines after you have completely understood and accpet all the possible adverse reactions/side effects.   Do not drive when taking Pain medications.   Do not take more than prescribed Pain, Sleep and Anxiety Medications  Special Instructions: If you have smoked or chewed Tobacco  in the last 2 yrs please stop smoking, stop any regular Alcohol  and or any Recreational drug use.  Wear Seat belts while driving.  Please note  You were cared for by a hospitalist during your hospital stay. Once you are discharged, your primary care physician will handle any further medical issues. Please note that NO REFILLS for any discharge medications will be authorized once you are discharged, as it is imperative that you return to your primary care physician (or establish a relationship  with a primary care physician if you do not have one) for your aftercare needs so that they can reassess your need for medications and monitor your lab values.  Diet recommendation: Heart Healthy diet  Discharge Instructions    Amb Referral to Cardiac Rehabilitation    Complete by:  As directed   Diagnosis:  PCI     Ambulatory referral to Neurology    Complete by:  As directed   An appointment is requested in approximately: 1 week  Patient is a established patient-admitted with breakthrough seizures     Call MD for:  difficulty breathing, headache or visual disturbances    Complete by:  As directed      Call MD for:  persistant nausea and vomiting    Complete by:  As directed      Call MD for:  severe uncontrolled pain    Complete by:  As directed      Diet - low sodium heart healthy    Complete by:  As directed      Increase activity slowly    Complete by:  As directed            Follow-up Information    Follow up with Sheela Stack, MD. Schedule an appointment as soon as possible for a visit in 1 week.   Specialty:  Endocrinology   Contact information:   [place] [address] [phone]       Follow up with Marshell Garfinkel, MD.   Specialty:  Pulmonary Disease   Why:  Office will call with date/time-if you do not hear from them-please give them a call , Hospital follow up with Pulmonologist MD   Contact information:   [address] [place] [address] [phone]       Follow up with HAGER, BRYAN, PA-C. Go on 05/18/2015.   Specialties:  Physician Assistant, Radiology, Interventional Cardiology   Why:  '@9'$ :30am for TCM   Contact information:   [address] [place] [address] [phone]       Follow up with [facility]   Why:  will arrange overnight pulse oximetry   Contact information:   [place] [address] [phone]       Total Time spent on discharge equals 45  minutes.  SignedOren Binet 05/09/2015 1:13 PM

## 2015-05-09 NOTE — Progress Notes (Signed)
CARDIAC REHAB PHASE I   PRE:  Rate/Rhythm: 88 SR  BP:  Sitting: 146/72        SaO2: 94 2L, 92 RA  MODE:  Ambulation: 150 ft   POST:  Rate/Rhythm: 88 SR  BP:  Sitting: 159/82         SaO2: 92 RA  Pt ambulated 150 ft on RA, handheld assist, gait belt, mildly unsteady gait, tolerated fair.  Pt c/o mild DOE, denies cp, dizziness, declined rest stop, declined to ambulate farther even though he states "I'm not tired." Pt weaned to RA, sats 92% on RA during ambulation. Completed PCI/stent education with pt, pt's wife and daughter at bedside.  Reviewed risk factors, tobacco cessation (gave pt fake cigarette), anti-platelet therapy, stent card, activity restrictions, ntg, exercise, heart healthy diet, portion control, sodium restrictions and phase 2 cardiac rehab. Pt and family verbalized understanding, asked many questions. Pt agrees to phase 2 cardiac rehab referral, pt agrees to referral to [facility], states he does not have transportation to attend. Pt to bed after walk, call bell within reach.   [id number] Lenna Sciara, RN, BSN 05/09/2015 10:45 AM

## 2015-05-09 NOTE — Progress Notes (Signed)
Pt discharged to home via w/c, condition stable, accompanied by family members, prescriptions given, education complete, verbalized understanding of all instruction.  Edward Qualia RN

## 2015-05-09 NOTE — Care Management Important Message (Signed)
Important Message  Patient Details  Name: Cristian Moss MRN: [id number] Date of Birth: October 01, 1945   Medicare Important Message Given:  Yes    Erenest Rasher, RN 05/09/2015, 12:04 PM

## 2015-05-09 NOTE — Progress Notes (Signed)
Patient ID: [name], male   DOB: 07-27-1945, 70 y.o.   MRN: [id number]  Patient Name: Cristian Moss Date of Encounter: 05/09/2015  Principal Problem:   CAP (community acquired pneumonia) Active Problems:   Essential hypertension   EMPHYSEMA   Seizure ([place])   Elevated troponin   Diarrhea   Normocytic anemia   Tobacco abuse   CKD (chronic kidney disease), stage III   Cavitating mass in right upper lung lobe   Left adrenal mass (HCC)   Hemoptysis   Coronary artery calcification seen on CAT scan   Abdominal fullness   Adrenal mass (HCC)   Cancer (HCC)   Dyspnea   Lung mass   Unstable angina (HCC)   Abnormal echocardiogram findings without diagnosis   Abnormal finding on EKG - evolving   NSTEMI (non-ST elevated myocardial infarction) ([place])   Length of Stay: 5  SUBJECTIVE  Ambulating no chest pain   Admitted for a breakthrough seizure, he developed angina with ECG changes and abnormal enzymes for which he received a drug-eluting stent (Synergy) to the RCA, also has 65% OM1 stenosis left for medical Rx. Unfortunately also has lung and adrenal masses suspicious for lung cancer with metastasis and developed frank hemoptysis.  CURRENT MEDS . acetaminophen  1,000 mg Oral TID  . amoxicillin-clavulanate  1 tablet Oral Q12H  . antiseptic oral rinse  7 mL Mouth Rinse BID  . arformoterol  15 mcg Nebulization BID  . aspirin EC  81 mg Oral Daily  . atorvastatin  80 mg Oral q1800  . bromocriptine  2.5 mg Oral BID  . budesonide (PULMICORT) nebulizer solution  0.5 mg Nebulization BID  . clopidogrel  75 mg Oral Q breakfast  . ferrous sulfate  325 mg Oral TID WC  . folic acid  1 mg Oral Daily  . levETIRAcetam  1,500 mg Oral Q12H  . losartan  50 mg Oral Daily  . metoprolol tartrate  12.5 mg Oral BID  . nitroGLYCERIN  0.5 inch Topical 4 times per day  . pantoprazole  40 mg Oral BID  . phenytoin  300 mg Oral QHS   And  . phenytoin  50 mg Oral QHS  . polyethylene glycol  17 g Oral  Daily  . predniSONE  40 mg Oral Q breakfast  . sodium chloride flush  3 mL Intravenous Q12H    OBJECTIVE   Intake/Output Summary (Last 24 hours) at 05/09/15 0806 Last data filed at 05/08/15 2210  Gross per 24 hour  Intake    483 ml  Output    175 ml  Net    308 ml   Filed Weights   05/07/15 0308 05/08/15 0618 05/09/15 0400  Weight: 86 kg (189 lb 9.5 oz) 80.015 kg (176 lb 6.4 oz) 75.1 kg (165 lb 9.1 oz)    PHYSICAL EXAM Filed Vitals:   05/08/15 2210 05/08/15 2320 05/09/15 0400 05/09/15 0749  BP: 100/59 114/57 113/67   Pulse: 93 86 73   Temp:  98.2 F (36.8 C) 98.2 F (36.8 C)   TempSrc:  Oral Oral   Resp: '22 22 22   '$ Height:      Weight:   75.1 kg (165 lb 9.1 oz)   SpO2: 93% 88% 100% 92%   General: frail ill appearing make in NAD. Neuro: Alert and oriented X 3. Moves all extremities spontaneously. Psych: Normal affect. HEENT: Normal Neck: Supple without bruits or JVD. Lungs: Resp regular and unlabored. diminished breath sounds bilat w/ scattered rhonchi Heart:  RRR no s3, s4, or murmurs. Abdomen: Soft, non-tender, non-distended, BS + x 4.  Extremities: No clubbing, cyanosis or edema. DP/PT/Radials 2+ and equal bilaterally. R radial cath site without hematoma.   LABS  CBC  Recent Labs  05/08/15 0320 05/09/15 0445  WBC 12.6* 11.8*  HGB 8.6* 9.4*  HCT 26.9* 29.1*  MCV 88.2 91.2  PLT 229 599   Basic Metabolic Panel  Recent Labs  05/07/15 0305 05/08/15 0320  NA 135 137  K 4.5 4.8  CL 104 105  CO2 23 23  GLUCOSE 129* 138*  BUN 13 13  CREATININE 0.85 0.94  CALCIUM 8.2* 8.2*   Liver Function Tests  Recent Labs  05/07/15 0305 05/08/15 0320  AST 15 13*  ALT 12* 11*  ALKPHOS 79 76  BILITOT 0.3 0.2*  PROT 5.5* 6.2*  ALBUMIN 2.2* 2.3*    Radiology Studies Imaging results have been reviewed and No results found.  TELE NSR no arrhythmia 05/09/2015    ASSESSMENT AND PLAN  1. CAD s/p DES for unstable angina/small NSTEMI - Very  high risk for acute stent thrombosis if DAPT is stopped earlier than 3 months. This would preclude transbronchial or CT guided lung biopsy. Could we obtain diagnosis from sputum cytology? Bronchial lavage or brushings? Consider biopsy after stopping antiplatelets with integrilin "bridging" (not an evidence based approach, but something we have done before; would still want to delay for a month).  2. Lung mass, adrenal mass (metastasis), hemoptysis improved. Signs of improving COPD excerbation.  On medium dose steroids  CCM to see ? Today consider bronchoscopy  with brushings   3. Mild LV dysfunction, EF 45%. On metoprolol and losartan. No overt CHF.  4. Anemia - no change in Hgb and no overt bleeding last 24 h.  5. Seizure disorder  On dilantin   6. HTN  Well controlled.  Continue current medications and low sodium Dash type diet.   ARB given mildly decreased EF     Jenkins Rouge

## 2015-05-10 ENCOUNTER — Other Ambulatory Visit: Payer: Self-pay | Admitting: Pulmonary Disease

## 2015-05-10 DIAGNOSIS — J984 Other disorders of lung: Secondary | ICD-10-CM

## 2015-05-10 NOTE — Telephone Encounter (Signed)
Patient contacted regarding discharge from [facility] on 05/09/15.  Patient understands to follow up with provider Tarri Fuller at [facility] office 9:30am on 05/18/15. Patient understands discharge instructions? Yes Patient understands medications and regiment? Yes Patient understands to bring all medications to this visit? Yes

## 2015-05-10 NOTE — Progress Notes (Signed)
Per PM: Patient will need a CT Chest and follow up to discuss results in 4-6 weeks.  Per pt's chart, this plan was already discussed with him - he is still inpatient Order placed

## 2015-05-11 ENCOUNTER — Telehealth: Payer: Self-pay | Admitting: Cardiovascular Disease

## 2015-05-11 LAB — QUANTIFERON IN TUBE
QFT TB AG MINUS NIL VALUE: 0.06 IU/mL
QUANTIFERON MITOGEN VALUE: 0.32 [IU]/mL
QUANTIFERON NIL VALUE: 0.14 [IU]/mL
QUANTIFERON TB AG VALUE: 0.2 IU/mL
QUANTIFERON TB GOLD: UNDETERMINED

## 2015-05-11 LAB — QUANTIFERON TB GOLD ASSAY (BLOOD)

## 2015-05-11 NOTE — Telephone Encounter (Signed)
Don't worry about 2 extra tabs.  Just have him taper 40 mg x 2d, 30 mg x 2d, '20mg'$  x 1 day then 10 mg x 2 days

## 2015-05-11 NOTE — Telephone Encounter (Signed)
Pt's home health nurse calling re issue with meds-pt put on Prednisone and was to taper down from 4 pills, pt's dtr only gave him 1 pill the last two days-pt has no pcp so she is calling here-new pcp appt 05-31-15

## 2015-05-11 NOTE — Telephone Encounter (Signed)
Pt released from hosp on tapered prednisone rx. He was not started on this correctly. HHRN came out today and helped pt with this.  Pt is starting out on 4 pills today for tapered dose, he has taken 1 pill for past 2 days. If he takes tapered dose as written, he will need 2 extra pills.  She wanted to know if better to do this and have Rx for 2 additional pills called in, or to modify tapering schedule.  She is aware I am routing to pharmD for recommendation.

## 2015-05-11 NOTE — Telephone Encounter (Signed)
Recommendations for tapering communicated to Cristian Moss, who voiced understanding.

## 2015-05-12 ENCOUNTER — Ambulatory Visit: Payer: Self-pay

## 2015-05-12 ENCOUNTER — Other Ambulatory Visit: Payer: Self-pay

## 2015-05-12 NOTE — Patient Outreach (Signed)
[facility]) Care Management  05/12/2015  Cristian Moss June 19, 1945 [id number]   [facility] PNA Program  Referral Date:  05/12/15 Issue:  Red Alert Day #1 - Scheduled a follow-up appt? No  Outreach call to patient.  Patient not reached.  No answer following multiple rings.  RN CM scheduled for next contact call within next 24 hous.   Mariann Laster, RN, BSN, [facility], CCM  [employer] Management Coordinator [phone] Direct [phone] Cell [phone] Office [phone] Fax

## 2015-05-13 ENCOUNTER — Other Ambulatory Visit: Payer: Self-pay

## 2015-05-13 DIAGNOSIS — J189 Pneumonia, unspecified organism: Secondary | ICD-10-CM

## 2015-05-13 NOTE — Patient Outreach (Signed)
[facility]) Care Management  05/13/2015  Cristian Moss 29-Jan-1946 [id number]   [facility] PNA Program  Referral Date: 05/12/15 Issue: Red Alert Day #1 - Scheduled a follow-up appt? No  Outreach call #2 to patient. Patient reached.  [facility] Consult call completed with patient and daughter, Cristian Moss  Providers: New Primary MD:  Dr. Aura Dials, [facility] - next appt 05/31/15  Endocrinologist:  Dr. Reynold Bowen, Endocrinology - to schedule appt within one week of discharge [phone] Pulmonologist:  Dr. Marshell Garfinkel - Office will call with date/time-if you do not hear from them-please give them a call [phone] Physician Assistant, Radiology, Interventional Cardiology HAGER, BRYAN, PA-C. Next appt  05/18/2015 [phone] [employer]:  DME:  Pulse Oximetry [phone] HH: None but daughter states she was advised that a nurse would be coming to assist with medication management.  Insurance: HTA  Social: Patient lives in his home with his wife.  Mobility: Ambulates with cane and walker and states he is not walking very much. (H/o on PT recommendations at discharge) Falls: yes 1 Transportation:  Caregiver: wife; supportive daughter, Cristian Moss [phone] but does not live close by.  DME: cane, walker, pulse oximetry ([employer]); (H/o 3N1, Rolling Walker at discharge ordered with [facility])  THN conditions:  [facility] PNA Program Other:  HTN, CKD Admissions: 1 ER visits: 0  Medications:  Patient taking more than 10 medications  Co-pay cost issues: yes.  States he is not able to afford his medications and his daughter had to purchase his medications at time of hospital discharge costing over $150 dollars.  Flu Vaccine: Pneumonia Vaccine:  Daughter states questions earlier this week regarding patients Prednisone.  Daughter states she thought she had this discussion with this RN CM.  Daughter states contact advised that someone would be coming to help  patient manage his medications.  Daughter states plan to return to her home today and would like to get this resolved prior to leaving.  Daughter confirms patient does not have any [place] services.   RN CM clarified that this contact was not this RN CM.  RN CM read Epic note to patient and daughter:  "Pt's home health nurse calling re issue with meds-pt put on Prednisone and was to taper down from 4 pills, pt's dtr only gave him 1 pill the last two days-pt has no pcp so she is calling here-new pcp appt 05-31-15" RN CM will contact New Primary Care MD to investigate who is patients [facility] provider.  RN CM contacted New MD No record indicating [place] provider.   Plan:  [place] RN CM Referral  -Hospital admission within past 30 days.  -needs community assessment to assess for additional home care service needs.   [facility] Referral  -more than 10 medications -medication cost issues    [facility] Administrative Assistant Referral  -Medicare extra help application - ok to complete with patient; patient agreed if additional information needed that he is unable to provide that it is ok to call and discuss with his daughter, Cristian Moss [phone].   [facility] Social Work Referral -Company secretary CM advised in next contact call within the next 10 business days. RN CM advised to please notify MD of any changes in condition prior to scheduled appt's.   RN CM provided contact name and # [phone] or main office # [phone] and 24-hour nurse line # [phone].  RN CM confirmed patient is aware of 911 services for urgent emergency needs.  Mariann Laster, RN, BSN, [facility], CCM  [employer] Management Coordinator [phone] Direct [phone] Cell [phone] Office [phone] Fax

## 2015-05-13 NOTE — Patient Outreach (Addendum)
[facility]) Care Management  05/13/2015  Cristian Moss Mar 16, 1946 [id number]   Contact call to new Primary MD:  Dr. Melodie Bouillon no notes on [facility] provider received at this time.  1st appt scheduled for March.   Contact call to [place]: Confirmed no HH order on file.   Contact call to past PCP:  Dr. Forde Dandy Left voice message on nurse line: contact Lattie Haw  Requested call back with name of [facility] provider if known.   Contact call to Dr. Sanda Klein, [place] not reached.  RN CM left detailed voice mail message regarding Epic note 05/11/2015 noting [facility] provider call.  RN CM requested call back with name of [facility] provider.  Family has received no HH visits and do not know the name of agency.   RN CM contacted patient and provided the above updates.  RN CM reviewed all [facility] provider names but patient unable to identify who the caller / "Aedin Jeansonne" was calling from.  RN CM reviewed other options to assist with medication management until issue resolved as [place] CM has up to 10 Business days for visit.  RN CM reccommended patient purchase two medication boxes and have daughter prepare med box before leaving to assist with med management needs.  RN CM advised RN CM will provide patient update if new information regarding this matter received.   Mariann Laster, RN, BSN, [facility], CCM  [employer] Management Coordinator [phone] Direct [phone] Cell [phone] Office [phone] Fax

## 2015-05-16 ENCOUNTER — Other Ambulatory Visit: Payer: Self-pay

## 2015-05-16 NOTE — Patient Outreach (Addendum)
[name]) Care Management  05/16/2015  Cristian Moss 06/30/1945 [id number]  Referral received per telephonic care coordinator. RNCM spoke with both wife and member who were on the phone at the same time with member's permission. Two identifiers confirmed-Recent admission 2/7-2/13 with respiratory failure, myocardial infarction, COPD exacerbation, breakthrough seizure.Marland Kitchen RNCM called for transiton of care; medications reviewed. Member was taking one whole metoprolol, however ordered dose is 12.mg twice a day. Wife acknowledged understanding of how to take metoprolol. Cristian Moss is agreeable to home visit this week.  Member has follow up appointment scheduled with cardiologist on Wednesday, Dr. Jerold Coombe care on march 7th, pulmonologist on march 16   Member has had overnight oximetry test and is now on oxygen at 2 liters/nasal cannula at night.  Plan: Home visit scheduled for Wednesday.  Cristian Silversmith, RN, MSN, [place] Coordinator Cell: [phone]

## 2015-05-18 ENCOUNTER — Encounter: Payer: Self-pay | Admitting: Physician Assistant

## 2015-05-18 ENCOUNTER — Other Ambulatory Visit: Payer: Self-pay | Admitting: Physician Assistant

## 2015-05-18 ENCOUNTER — Other Ambulatory Visit: Payer: Self-pay | Admitting: Licensed Clinical Social Worker

## 2015-05-18 ENCOUNTER — Ambulatory Visit (INDEPENDENT_AMBULATORY_CARE_PROVIDER_SITE_OTHER): Payer: PPO | Admitting: Physician Assistant

## 2015-05-18 ENCOUNTER — Other Ambulatory Visit: Payer: Self-pay

## 2015-05-18 VITALS — BP 104/78 | HR 89 | Resp 16 | Ht 70.0 in | Wt 165.0 lb

## 2015-05-18 VITALS — BP 106/74 | HR 99 | Ht 70.0 in | Wt 165.0 lb

## 2015-05-18 DIAGNOSIS — I10 Essential (primary) hypertension: Secondary | ICD-10-CM

## 2015-05-18 DIAGNOSIS — Z720 Tobacco use: Secondary | ICD-10-CM

## 2015-05-18 DIAGNOSIS — R5383 Other fatigue: Secondary | ICD-10-CM

## 2015-05-18 DIAGNOSIS — N183 Chronic kidney disease, stage 3 unspecified: Secondary | ICD-10-CM

## 2015-05-18 DIAGNOSIS — I2583 Coronary atherosclerosis due to lipid rich plaque: Secondary | ICD-10-CM

## 2015-05-18 DIAGNOSIS — R0989 Other specified symptoms and signs involving the circulatory and respiratory systems: Secondary | ICD-10-CM

## 2015-05-18 DIAGNOSIS — Z79899 Other long term (current) drug therapy: Secondary | ICD-10-CM

## 2015-05-18 DIAGNOSIS — I2510 Atherosclerotic heart disease of native coronary artery without angina pectoris: Secondary | ICD-10-CM | POA: Insufficient documentation

## 2015-05-18 LAB — CBC
HEMATOCRIT: 30.1 % — AB (ref 39.0–52.0)
HEMOGLOBIN: 9.9 g/dL — AB (ref 13.0–17.0)
MCH: 28.6 pg (ref 26.0–34.0)
MCHC: 32.9 g/dL (ref 30.0–36.0)
MCV: 87 fL (ref 78.0–100.0)
MPV: 8.8 fL (ref 8.6–12.4)
Platelets: 258 10*3/uL (ref 150–400)
RBC: 3.46 MIL/uL — AB (ref 4.22–5.81)
RDW: 14.5 % (ref 11.5–15.5)
WBC: 16.5 10*3/uL — ABNORMAL HIGH (ref 4.0–10.5)

## 2015-05-18 MED ORDER — METOPROLOL TARTRATE 25 MG PO TABS: 12.5000 mg | ORAL_TABLET | Freq: Two times a day (BID) | ORAL | Status: DC

## 2015-05-18 MED ORDER — CLOPIDOGREL BISULFATE 75 MG PO TABS: 75.0000 mg | ORAL_TABLET | Freq: Every day | ORAL | Status: AC

## 2015-05-18 MED ORDER — LOSARTAN POTASSIUM 50 MG PO TABS: 50.0000 mg | ORAL_TABLET | Freq: Every day | ORAL | Status: DC

## 2015-05-18 MED ORDER — ATORVASTATIN CALCIUM 80 MG PO TABS: 80.0000 mg | ORAL_TABLET | Freq: Every day | ORAL | Status: AC

## 2015-05-18 NOTE — Patient Instructions (Addendum)
Medication Instructions:   Your physician recommends that you continue on your current medications as directed. Please refer to the Current Medication list given to you today.  Labwork:  Your physician recommends that you return for lab work TODAY - first floor of this building - [address]   Testing/Procedures:  Your physician has requested that you have a lower extremity arterial duplex. This test is an ultrasound of the arteries in the legs. It looks at arterial blood flow in the legs. Allow one hour for Lower Arterial scans. There are no restrictions or special instructions   Follow-Up:  Your physician recommends that you schedule a follow-up appointment in: 3 months with Dr. Gwenlyn Found    Any Other Special Instructions Will Be Listed Below (If Applicable).

## 2015-05-18 NOTE — Patient Outreach (Signed)
[facility]) Care Management  05/18/2015  Cristian Moss 06/15/1945 [id number]   Assessment-CSW received referral from [facility] telephonic nurse to assist family with medicaid and financial concerns. Patient's spouse answered and stated that patient could not come to phone right now as he was in the bathroom. CSW introduced self, reason for call and of [place] Management services. Spouse reports "We don't need financial help. The only help I asked for was for someone to help [place] afford medications." CSW informed her that [place] will be involved as a referral was made as well. CSW educated spouse on [place] and their benefits. Spouse states that patient receives  $2,050 from social security each month and she is unsure of the amount she receives each month. CSW informed spouse that to be eligible for Medicaid you have to make less than $990 if you are a family of one and less than $1,335 if you are a family of two. Spouse denies interest in applying for Medicaid stating continuously that she is interested in someone assisting her with medication education and medication cost. Spouse states "We are fine on affording the other stuff just the medications can be hard." Spouse reports that they have stable transportation with "[employer]" and CSW questioned if she meant [employer]. She states "I don't know the name off the top of my head but we have been using them for five years. They take [place] to the doctor and to the store." Spouse denies needing any transportation assistance. Spouse agreeable to home visit for 05/19/15.  Plan-CSW will complete home visit on 05/19/15 and will complete assessment.  Eula Fried, BSW, MSW, [email or web address] Phone: [phone] Fax: [phone]

## 2015-05-18 NOTE — Progress Notes (Addendum)
Patient ID: Cristian Moss, male   DOB: Mar 19, 1946, 70 y.o.   MRN: [id number]    Date:  05/18/2015   ID:  Cristian Moss, DOB [birth year], MRN [id number]  PCP:  [name], MD  Primary Cardiologist:  Gwenlyn Found  Chief Complaint  Patient presents with  . f/up cath & stent    complains of chest pressure; short of breath when going up stairs (not new); a little lightheadedness; feet swelling; lower back pain since procedure     History of Present Illness: Cristian Moss is a 70 y.o. male with a history of hypertension, tobacco abuse, and stage  II-III chronic kidney disease. 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 This resulted in a seizure disorder for which, he has been followed by neurology and treated with seizure medications. He says he has probably 7+ seizures per year. Historically, his seizures manifest as blank staring and absence, usually lasting only a few minutes, and resolving spontaneously. His hospitalized for liver seventh 2 06/06/2015 community acquired pneumonia, Seizure. He had elevated troponin developed chest pain. He underwent a left heart catheterization revealing a proximal RCA that was 80% stenosed in a first marginal of 65%. The proximal RCA was treated with a synergy drug-eluting stent to start aspirin and Plavix. He was also put on Lipitor 80 mg, Lopressor 2012.5 twice daily takes Cozaar 54 hypertension. His echocardiogram revealed an ejection fraction of 45-50%.   there is also hypokinesis of the apical anterior lateral myocardium.  He also has a history of chronic dyspnea on exertion, dating back about 7 years. He says that at baseline, he can walk about 50 yards on flat surface prior to having to stop and rest secondary to dyspnea. He has no history of exertional chest discomfort.   The patient presents for posthospital follow-up according to his wife he does not walk at all and just sits in  a chair watching TV. Continues to be dyspneic but denies chest pain, apnea. He says he gets severe pain in his feet when he starts to walk.  He also feels generally fatigued, but again he does not walk anywhere which may be due to the pain in his lower extremities.  He is taking all his medications.      The patient currently denies nausea, vomiting, fever, dizziness, PND, cough, congestion, abdominal pain, hematochezia,  lower extremity edema,     Wt Readings from Last 3 Encounters:  05/18/15 165 lb (74.844 kg)  05/09/15 165 lb 9.1 oz (75.1 kg)  06/19/14 177 lb (80.287 kg)     Past Medical History  Diagnosis Date  . Essential hypertension   . Seizures ([place])   . COPD (chronic obstructive pulmonary disease) ([place])   . Thrombocytopenia ([place])   . 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 BPH (benign prostatic hypertrophy)   . Erectile dysfunction   . Empty sella syndrome ([place])     History of  . CKD (chronic kidney disease), stage III   . Hyponatremia     history of  . Tobacco abuse     Current Outpatient Prescriptions  Medication Sig Dispense Refill  . albuterol (PROVENTIL HFA;VENTOLIN HFA) 108 (90 Base) MCG/ACT inhaler Inhale 2 puffs into the lungs every 6 (six) hours as needed for wheezing or shortness of breath. 1 Inhaler 0  . amoxicillin-clavulanate (AUGMENTIN) 875-125 MG tablet Take 1 tablet by mouth every 12 (twelve) hours. 48 tablet 0  .  aspirin EC 81 MG EC tablet Take 1 tablet (81 mg total) by mouth daily.    Marland Kitchen atorvastatin (LIPITOR) 80 MG tablet Take 1 tablet (80 mg total) by mouth daily at 6 PM. 30 tablet 0  . bromocriptine (PARLODEL) 2.5 MG tablet Take 2.5 mg by mouth 2 (two) times daily.     . budesonide-formoterol (SYMBICORT) 160-4.5 MCG/ACT inhaler Inhale 2 puffs into the lungs 2 (two) times daily. 1 Inhaler 0  . clopidogrel (PLAVIX) 75 MG tablet Take 1 tablet (75 mg total) by mouth daily with breakfast. 30 tablet 0  . ferrous sulfate 325 (65 FE) MG tablet Take 1 tablet (325 mg  total) by mouth 2 (two) times daily with a meal. 60 tablet 0  . levETIRAcetam (KEPPRA) 750 MG tablet Take 1,500 mg by mouth every 12 (twelve) hours.    Marland Kitchen losartan (COZAAR) 50 MG tablet Take 50 mg by mouth daily.  0  . metoprolol tartrate (LOPRESSOR) 25 MG tablet Take 0.5 tablets (12.5 mg total) by mouth 2 (two) times daily. 60 tablet 0  . nicotine (NICODERM CQ) 14 mg/24hr patch Place 1 patch (14 mg total) onto the skin daily. 28 patch 0  . nitroGLYCERIN (NITROSTAT) 0.4 MG SL tablet Place 1 tablet (0.4 mg total) under the tongue every 5 (five) minutes as needed for chest pain. FOR 3 DOSES ONLY 30 tablet 0  . oxyCODONE (OXY IR/ROXICODONE) 5 MG immediate release tablet Take 1 tablet (5 mg total) by mouth every 6 (six) hours as needed for moderate pain. 30 tablet 0  . pantoprazole (PROTONIX) 40 MG tablet Take 1 tablet (40 mg total) by mouth 2 (two) times daily. 60 tablet 0  . phenytoin (DILANTIN) 100 MG ER capsule Take 3 capsules (300 mg total) by mouth at bedtime. 90 capsule 0  . phenytoin (DILANTIN) 50 MG tablet Chew 1 tablet (50 mg total) by mouth at bedtime. Along with 300 mg-for a total of 350 mg daily 30 tablet 0  . polyethylene glycol (MIRALAX) packet Take 17 g by mouth daily. 14 each 0   No current facility-administered medications for this visit.    Allergies:   No Known Allergies  Social History:  The patient  reports that he quit smoking about 2 weeks ago. His smoking use included Cigarettes. He has a 23.5 pack-year smoking history. He has never used smokeless tobacco. He reports that he does not drink alcohol or use illicit drugs.   Family history:   Family History  Problem Relation Age of Onset  . Asthma Daughter   . Heart disease Sister     ? developed in her 17's  . Other Mother     died in her 90's of unknown causes  . Other Father     pt unaware of father's pmh.    ROS:  Please see the history of present illness.  All other systems reviewed and negative.   PHYSICAL  EXAM: VS:  BP 106/74 mmHg  Pulse 99  Ht '5\' 10"'$  (1.778 m)  Wt 165 lb (74.844 kg)  BMI 23.68 kg/m2 Well nourished, well developed, in no acute distress HEENT: Pupils are equal round react to light accommodation extraocular movements are intact. Conjunctiva are pale Neck: no JVDNo cervical lymphadenopathy. Cardiac: Regular rate and rhythm without murmurs rubs or gallops. Lungs:  clear to auscultation bilaterally, no wheezing, rhonchi or rales Abd: soft, nontender, positive bowel sounds all quadrants, no hepatosplenomegaly Ext: no lower extremity edema.  1+ radial and 0 pedis pulses.  Skin: warm and dry Neuro:  Grossly normal  EKG:  Normal sinus rhythm rate 99 bpm. Lateral T-wave inversion    ASSESSMENT AND PLAN:  Problem List Items Addressed This Visit    Tobacco abuse   Fatigue   Relevant Orders   CBC   Essential hypertension   Coronary artery disease due to lipid rich plaque   CKD (chronic kidney disease), stage III    Other Visit Diagnoses    Decreased pulse    -  Primary    Relevant Orders    VAS [place] LOWER EXTREMITY ARTERIAL DUPLEX        Mr. Cech general looks very fatigued. His wife states he does not exercise at all. He sits in his chair watching TV all day.  I suspect some of his fatigue is related to deconditioning.   he does report some really dark stools on occasion. His conjunctiva very pale. We will check a CBC. He is taking all medications as directed. we will  refill several of them is was discharged without refills. He has not smoked since his hospitalization. Blood pressure is controlled. He has extremely poor distal and radial pulses and gets severe pain in his lower extremities when he does ambulate.  We'll start with lower extremity arterial Dopplers.    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 50-74% tandem right mid SFA stenosis. Occluded left proximal SFA with reconstitution via collateral  flow in the left mid SFA. Three vessel run-off, bilaterally. (See full report)  The patient was scheduled for PV angiogram for what I believe is life-style limiting claudication.  Wallis Vancott, PAC

## 2015-05-19 ENCOUNTER — Other Ambulatory Visit: Payer: Self-pay

## 2015-05-19 ENCOUNTER — Encounter: Payer: Self-pay | Admitting: Licensed Clinical Social Worker

## 2015-05-19 ENCOUNTER — Other Ambulatory Visit: Payer: Self-pay | Admitting: Licensed Clinical Social Worker

## 2015-05-19 NOTE — Patient Outreach (Signed)
[facility]) Care Management  05/19/2015  Argel Pablo February 21, 1946 [id number]  Inbound Voice Message received from [place], [place].   States calling to clarify that she is the [employer]" that patient is referring to but she is not from [facility] Provider but [employer] CM.  States referral was made to [facility] for Community Service Assessment and determine if [facility] services may be needed but no HH services have been ordered.   RN CM provided this update to assigned community team for carnification.   Mariann Laster, RN, BSN, [facility], CCM  [employer] Management Coordinator [phone] Direct [phone] Cell [phone] Office [phone] Fax

## 2015-05-19 NOTE — Patient Outreach (Signed)
[facility]) Care Management  05/19/2015  Cristian Moss 1945/10/21 [id number]  Care Coordination with social worker regarding plan of care. Eula Fried, LCSW to see member today. RNCM received voice message from [facility] daughter stating the reason Care management was asked to come out is to set up member's pill box. Member's daughter was not listed on the consent form. RNCM ask Cristian Moss to discuss consent to speak/care coordinate with member's daughter. Also reinforced that a referral was sent to [place] regarding medication management needs.   [name], RN, MSN, [place] Coordinator Cell: [phone]

## 2015-05-19 NOTE — Patient Outreach (Signed)
[facility]) Care Management  05/19/2015  Cristian Moss [birth year] [id number]  RNCM returned call to [name], Transitional care nurse with [facility] manager, who reports that she has spoken with member's daughter, [name]. RNCM discussed community resources regarding medication management. RNCM instructed that a referral has been made to [facility] care management pharmacy and discussed the [place] as an option for member, in addition to ensuring member and his wife are instructed on self-management strategies for medication management. [name] was also informed that [facility] care management does not have authorization to discuss member's case with daughter and that LCSW would discuss with member at home visit today.  Plan: continue care coordination as needed. Update [name].  [name], RN, MSN, [place] Coordinator Cell: [phone]

## 2015-05-19 NOTE — Patient Outreach (Signed)
[facility]) Care Management  [place]  05/18/2015   Algernon Mundie 1945-12-05 [id number]  Subjective:  Member expressed being tired, He had an appointment earlier in the day with cardiology.  Objective: BP 104/78 mmHg  Pulse 89  Resp 16  Ht 1.778 m ('5\' 10"'$ )  Wt 165 lb (74.844 kg)  BMI 23.68 kg/m2  SpO2 94% lungs clear, heart rate regular  Current Medications:  Current Outpatient Prescriptions  Medication Sig Dispense Refill  . albuterol (PROVENTIL HFA;VENTOLIN HFA) 108 (90 Base) MCG/ACT inhaler Inhale 2 puffs into the lungs every 6 (six) hours as needed for wheezing or shortness of breath. 1 Inhaler 0  . amoxicillin-clavulanate (AUGMENTIN) 875-125 MG tablet Take 1 tablet by mouth every 12 (twelve) hours. 48 tablet 0  . aspirin EC 81 MG EC tablet Take 1 tablet (81 mg total) by mouth daily.    Marland Kitchen atorvastatin (LIPITOR) 80 MG tablet Take 1 tablet (80 mg total) by mouth daily at 6 PM. 90 tablet 3  . bromocriptine (PARLODEL) 2.5 MG tablet Take 2.5 mg by mouth 2 (two) times daily.     . budesonide-formoterol (SYMBICORT) 160-4.5 MCG/ACT inhaler Inhale 2 puffs into the lungs 2 (two) times daily. 1 Inhaler 0  . clopidogrel (PLAVIX) 75 MG tablet Take 1 tablet (75 mg total) by mouth daily with breakfast. 90 tablet 3  . ferrous sulfate 325 (65 FE) MG tablet Take 1 tablet (325 mg total) by mouth 2 (two) times daily with a meal. 60 tablet 0  . levETIRAcetam (KEPPRA) 750 MG tablet Take 1,500 mg by mouth every 12 (twelve) hours.    Marland Kitchen losartan (COZAAR) 50 MG tablet Take 1 tablet (50 mg total) by mouth daily. 90 tablet 3  . metoprolol tartrate (LOPRESSOR) 25 MG tablet Take 0.5 tablets (12.5 mg total) by mouth 2 (two) times daily. 90 tablet 3  . nicotine (NICODERM CQ) 14 mg/24hr patch Place 1 patch (14 mg total) onto the skin daily. 28 patch 0  . nitroGLYCERIN (NITROSTAT) 0.4 MG SL tablet Place 1 tablet (0.4 mg total) under the tongue every 5 (five) minutes as needed for chest  pain. FOR 3 DOSES ONLY 30 tablet 0  . oxyCODONE (OXY IR/ROXICODONE) 5 MG immediate release tablet Take 1 tablet (5 mg total) by mouth every 6 (six) hours as needed for moderate pain. 30 tablet 0  . pantoprazole (PROTONIX) 40 MG tablet Take 1 tablet (40 mg total) by mouth 2 (two) times daily. 60 tablet 0  . phenytoin (DILANTIN) 100 MG ER capsule Take 3 capsules (300 mg total) by mouth at bedtime. 90 capsule 0  . phenytoin (DILANTIN) 50 MG tablet Chew 1 tablet (50 mg total) by mouth at bedtime. Along with 300 mg-for a total of 350 mg daily 30 tablet 0  . polyethylene glycol (MIRALAX) packet Take 17 g by mouth daily. 14 each 0   No current facility-administered medications for this visit.    Functional Status:  In your present state of health, do you have any difficulty performing the following activities: 05/18/2015 05/04/2015  Hearing? N -  Vision? N -  Difficulty concentrating or making decisions? Y -  Walking or climbing stairs? Y -  Dressing or bathing? Y -  Doing errands, shopping? Y N  Preparing Food and eating ? N -  Using the Toilet? N -  In the past six months, have you accidently leaked urine? Y -  Do you have problems with loss of bowel control? N -  Managing your Medications? Y -  Managing your Finances? N -  Housekeeping or managing your Housekeeping? N -    Fall/Depression Screening: PHQ 2/9 Scores 05/18/2015  PHQ - 2 Score 1   Fall Risk  05/18/2015 05/13/2015  Falls in the past year? Yes Yes  Number falls in past yr: 1 1  Injury with Fall? No No  Risk for fall due to : History of fall(s);Impaired balance/gait History of fall(s);Impaired balance/gait;Impaired mobility  Follow up Education provided;Falls prevention discussed Falls evaluation completed;Falls prevention discussed     Assessment: 70 year old with recent admission for pneumonia, seizure, myocardial infarction with intervention. Also with recent history of new mass currently being evaluated by provider.  Member's wife present.  RNCM discussed [facility] Care management a care management services.  Medications reviewed. Member reports his wife assist with managing medication. Mrs. Huckeby reports that her daughter helped her set up his medications last week. Ms. Walkup appeared to know the medications and indications for member's medications. Mrs. Harn had seizure medications separated from other medications and was able to state dose based on instructions on the bottle. Mrs. Pfalzgraf asked if care coordinator had a pill box. Medication box provided.  Member expressed that he would like to complete advanced directives. RNCM will make social work referral.  Abigail Miyamoto reports member general weakness, decreased stamina. RNCM discussed cardiopulmonary rehabilitation versus home health. Mrs. Krejci states that they do not have transportation. However, acknowledged that they use [employer] transportation for doctor appointments. Per Mrs. Bobby Rumpf, primary care doctor was Dr. Forde Dandy. Member will be establishing care with Dr. Sheryn Bison scheduled for March 7th. Member had a cardiology visit today. RNCM will follow up with cardiology regarding clearance/order for home health physical therapy.  Follow up appointments reviewed.  RNCM received voice message from [facility] daughter, Janyth Pupa, following home visit stating the reason care management was coming to see member was to set up member's medications box and request someone fill member's pill box. RNCM referred to [place] for further evaluation/assistance with medication management.  Plan: Continue transition of care. Eula Fried, LCSW to see member tomorrow-RNCM request LCSW discuss with member to see if he is agreeable to [facility] communicating with daughter and placing daughter on the consent form.  THN CM Care Plan Problem One        Most Recent Value   Care Plan Problem One  at risk for readmission   Role Documenting the Problem One  Care Management  [place] for Problem One  Active   THN Long Term Goal (31-90 days)  member will not be readmitted within the next 31 days.   THN Long Term Goal Start Date  05/16/15   Interventions for Problem One Long Term Goal  home visit completed, medications reviewed.THN calender organizer provided,encouraged attending follow up visit with providers.   THN CM Short Term Goal #1 (0-30 days)  member/caregiver will verbalize medications/doses and indications within the next 30 days.   THN CM Short Term Goal #1 Start Date  05/16/15   Interventions for Short Term Goal #1  reviewed medications, reinforced indications for medications, provided medication box, [facility] pharmacy referral.   THN CM Short Term Goal #2 (0-30 days)  member will attend follow up appointments as scheduled within the next 30 days.   THN CM Short Term Goal #2 Start Date  05/16/15   Interventions for Short Term Goal #2  reviewed upcoming scheduled appointments.     [name], RN,  MSN, [place] Coordinator Cell: [phone]

## 2015-05-19 NOTE — Patient Outreach (Signed)
[facility]) Care Management  [facility] Social Work  05/19/2015  Hillel Card 1945/07/01 [id number]   Current Medications:  Current Outpatient Prescriptions  Medication Sig Dispense Refill  . albuterol (PROVENTIL HFA;VENTOLIN HFA) 108 (90 Base) MCG/ACT inhaler Inhale 2 puffs into the lungs every 6 (six) hours as needed for wheezing or shortness of breath. 1 Inhaler 0  . amoxicillin-clavulanate (AUGMENTIN) 875-125 MG tablet Take 1 tablet by mouth every 12 (twelve) hours. 48 tablet 0  . aspirin EC 81 MG EC tablet Take 1 tablet (81 mg total) by mouth daily.    Marland Kitchen atorvastatin (LIPITOR) 80 MG tablet Take 1 tablet (80 mg total) by mouth daily at 6 PM. 90 tablet 3  . bromocriptine (PARLODEL) 2.5 MG tablet Take 2.5 mg by mouth 2 (two) times daily.     . budesonide-formoterol (SYMBICORT) 160-4.5 MCG/ACT inhaler Inhale 2 puffs into the lungs 2 (two) times daily. 1 Inhaler 0  . clopidogrel (PLAVIX) 75 MG tablet Take 1 tablet (75 mg total) by mouth daily with breakfast. 90 tablet 3  . ferrous sulfate 325 (65 FE) MG tablet Take 1 tablet (325 mg total) by mouth 2 (two) times daily with a meal. 60 tablet 0  . levETIRAcetam (KEPPRA) 750 MG tablet Take 1,500 mg by mouth every 12 (twelve) hours.    Marland Kitchen losartan (COZAAR) 50 MG tablet Take 1 tablet (50 mg total) by mouth daily. 90 tablet 3  . metoprolol tartrate (LOPRESSOR) 25 MG tablet Take 0.5 tablets (12.5 mg total) by mouth 2 (two) times daily. 90 tablet 3  . nicotine (NICODERM CQ) 14 mg/24hr patch Place 1 patch (14 mg total) onto the skin daily. 28 patch 0  . nitroGLYCERIN (NITROSTAT) 0.4 MG SL tablet Place 1 tablet (0.4 mg total) under the tongue every 5 (five) minutes as needed for chest pain. FOR 3 DOSES ONLY 30 tablet 0  . oxyCODONE (OXY IR/ROXICODONE) 5 MG immediate release tablet Take 1 tablet (5 mg total) by mouth every 6 (six) hours as needed for moderate pain. 30 tablet 0  . pantoprazole (PROTONIX) 40 MG tablet Take 1 tablet (40 mg  total) by mouth 2 (two) times daily. 60 tablet 0  . phenytoin (DILANTIN) 100 MG ER capsule Take 3 capsules (300 mg total) by mouth at bedtime. 90 capsule 0  . phenytoin (DILANTIN) 50 MG tablet Chew 1 tablet (50 mg total) by mouth at bedtime. Along with 300 mg-for a total of 350 mg daily 30 tablet 0  . polyethylene glycol (MIRALAX) packet Take 17 g by mouth daily. 14 each 0   No current facility-administered medications for this visit.    Functional Status:  In your present state of health, do you have any difficulty performing the following activities: 05/18/2015 05/04/2015  Hearing? N -  Vision? N -  Difficulty concentrating or making decisions? Y -  Walking or climbing stairs? Y -  Dressing or bathing? Y -  Doing errands, shopping? Y N  Preparing Food and eating ? N -  Using the Toilet? N -  In the past six months, have you accidently leaked urine? Y -  Do you have problems with loss of bowel control? N -  Managing your Medications? Y -  Managing your Finances? N -  Housekeeping or managing your Housekeeping? N -    Fall/Depression Screening:  PHQ 2/9 Scores 05/19/2015 05/18/2015  PHQ - 2 Score 1 1    Assessment: CSW received updates from [facility] on patient before home visit.  CSW completed home visit on 05/19/15. Patient's wife was present during visit. Patient is alert and oriented. Patient's daughter had previously contacted RNCM and left a voice message but patient did not put daugher on consent. CSW questioned if he would like to put daughter on consent in order for [facility] clinical team to communicate with daughter in regards to his health and care plan. Patient declines and states that he does not wish to add his daughter to the consent. CSW will update RNCM. Family reports having transportation concerns. Patient and wife both use [employer] for both medical appointments and for errands. CSW provided patient with a list of transportation resources for [facility]  and educated him on the various programs. Patient declines wishing to complete a SCAT application stating "I have heard about those vans. You have to wait too long and I would be worried I would have a seizure." Patient and spouse agree that they will continue using [employer] and [employer]. Patient is aware that if he changes his mind, CSW can assist him with applying for SCAT. Family reports "We get by just fine. We do not have financial concerns but some of his medications are expensive." [place] referral has been made. Family declines needing financial resources for [facility]. CSW educated family on the [employer] and [employer]. Patient and spouse do not wish to be enrolled in the RN program that will be able to assist with filling his medication pill box. CSW educated patient on the [facility] and the [facility] in order to gain socialization. Patient reports that he will consider going to the [facility]. Patient reports his hobbies are reading, watching movies and going out to eat with his family. CSW educated patient on the importance of having an advance directive. Patient agreeable to completing document. Patient completed both the living will and health care power of attorney. Family is aware that he will need to have document notarized before providing a copy to his physicians. Patient states that he will have document notarized at his bank. CSW will complete next outreach within 10 days.  Plan: CSW will update RNCM. CSW will send involvement letter to PCP and route encounter. CSW will remain available to patient and family. CSW will make next outreach within 10 business days.  [facility] CM Care Plan Problem Two        Most Recent Value   Care Plan Problem Two  Patient does not have an advance directive   Role Documenting the Problem Two  Clinical Social Worker   Care Plan for Problem Two  Active   Interventions for Problem Two Long Term Goal   CSW will assist  patient and family with completing advance directive which will include both health care power of attorney and a living will and will encourage them to have document notarized and then made copies of in order to provide to physicians.    THN Long Term Goal (31-90) days  Patient will complete an advance directive within 90 days   THN Long Term Goal Start Date  05/19/15       Eula Fried, BSW, MSW, [email or web address] Phone: [phone] Fax: [phone]

## 2015-05-19 NOTE — Patient Outreach (Signed)
[facility]) Care Management  05/19/2015  Cristian Moss [birth year] [id number]  Care Coordination-RNCM called [place] to update/inform that member does not give consent for [place] Management to discuss his case with daughter, Cristian Moss. Voice message left.   Plan: Continue care coordination. Continue to follow.  [name], RN, MSN, [place] Coordinator Cell: [phone]

## 2015-05-20 ENCOUNTER — Encounter: Payer: Self-pay | Admitting: Licensed Clinical Social Worker

## 2015-05-20 ENCOUNTER — Other Ambulatory Visit: Payer: Self-pay

## 2015-05-20 ENCOUNTER — Telehealth: Payer: Self-pay | Admitting: *Deleted

## 2015-05-20 MED ORDER — LEVETIRACETAM 750 MG PO TABS: 1500.0000 mg | ORAL_TABLET | Freq: Two times a day (BID) | ORAL | Status: AC

## 2015-05-20 NOTE — Telephone Encounter (Signed)
[place] FAXED

## 2015-05-24 ENCOUNTER — Other Ambulatory Visit: Payer: Self-pay | Admitting: Pharmacist

## 2015-05-24 NOTE — Patient Outreach (Signed)
[place] [facility]) Care Management  [place]   05/24/2015  Secundino Ellithorpe 09/15/1945 [id number]  Subjective: Cristian Moss is a 70yo who was referred to [place] from [place] for mediation assistance.  Per referral, patient reports difficulty with medication cost.    I made outreach call to patient.  I was unable to reach him.  No one answered the phone and there was on option to leave a voicemail.    Objective:   Current Medications: Current Outpatient Prescriptions  Medication Sig Dispense Refill  . albuterol (PROVENTIL HFA;VENTOLIN HFA) 108 (90 Base) MCG/ACT inhaler Inhale 2 puffs into the lungs every 6 (six) hours as needed for wheezing or shortness of breath. 1 Inhaler 0  . amoxicillin-clavulanate (AUGMENTIN) 875-125 MG tablet Take 1 tablet by mouth every 12 (twelve) hours. 48 tablet 0  . aspirin EC 81 MG EC tablet Take 1 tablet (81 mg total) by mouth daily.    Marland Kitchen atorvastatin (LIPITOR) 80 MG tablet Take 1 tablet (80 mg total) by mouth daily at 6 PM. 90 tablet 3  . bromocriptine (PARLODEL) 2.5 MG tablet Take 2.5 mg by mouth 2 (two) times daily.     . budesonide-formoterol (SYMBICORT) 160-4.5 MCG/ACT inhaler Inhale 2 puffs into the lungs 2 (two) times daily. 1 Inhaler 0  . clopidogrel (PLAVIX) 75 MG tablet Take 1 tablet (75 mg total) by mouth daily with breakfast. 90 tablet 3  . ferrous sulfate 325 (65 FE) MG tablet Take 1 tablet (325 mg total) by mouth 2 (two) times daily with a meal. 60 tablet 0  . levETIRAcetam (KEPPRA) 750 MG tablet Take 2 tablets (1,500 mg total) by mouth every 12 (twelve) hours. 120 tablet 2  . losartan (COZAAR) 50 MG tablet Take 1 tablet (50 mg total) by mouth daily. 90 tablet 3  . metoprolol tartrate (LOPRESSOR) 25 MG tablet Take 0.5 tablets (12.5 mg total) by mouth 2 (two) times daily. 90 tablet 3  . nicotine (NICODERM CQ) 14 mg/24hr patch Place 1 patch (14 mg total) onto the skin daily. 28 patch 0  . nitroGLYCERIN  (NITROSTAT) 0.4 MG SL tablet Place 1 tablet (0.4 mg total) under the tongue every 5 (five) minutes as needed for chest pain. FOR 3 DOSES ONLY 30 tablet 0  . oxyCODONE (OXY IR/ROXICODONE) 5 MG immediate release tablet Take 1 tablet (5 mg total) by mouth every 6 (six) hours as needed for moderate pain. 30 tablet 0  . pantoprazole (PROTONIX) 40 MG tablet Take 1 tablet (40 mg total) by mouth 2 (two) times daily. 60 tablet 0  . phenytoin (DILANTIN) 100 MG ER capsule Take 3 capsules (300 mg total) by mouth at bedtime. 90 capsule 0  . phenytoin (DILANTIN) 50 MG tablet Chew 1 tablet (50 mg total) by mouth at bedtime. Along with 300 mg-for a total of 350 mg daily 30 tablet 0  . polyethylene glycol (MIRALAX) packet Take 17 g by mouth daily. 14 each 0   No current facility-administered medications for this visit.    Functional Status: In your present state of health, do you have any difficulty performing the following activities: 05/18/2015 05/04/2015  Hearing? N -  Vision? N -  Difficulty concentrating or making decisions? Y -  Walking or climbing stairs? Y -  Dressing or bathing? Y -  Doing errands, shopping? Y N  Preparing Food and eating ? N -  Using the Toilet? N -  In the past six months, have you  accidently leaked urine? Y -  Do you have problems with loss of bowel control? N -  Managing your Medications? Y -  Managing your Finances? N -  Housekeeping or managing your Housekeeping? N -    Fall/Depression Screening: PHQ 2/9 Scores 05/19/2015 05/18/2015  PHQ - 2 Score 1 1    Assessment: 1.  Medication review:  Drugs sorted by system:  Neurologic/Psychologic: none  Cardiovascular: aspirin, atorvastatin, clopidogrel, losartan, metoprolol tartrate, nitroglycerin SL  Pulmonary/Allergy: albuterol HFA, budesonide-formoterol  Gastrointestinal: pantoprazole, polyethylene glycol  Endocrine: none  Renal: none  Topical: nicotine patch  Pain: oxycodone  Vitamins/Minerals: ferrous  sulfate  Infectious Diseases: amoxicillin-clavulanate  Miscellaneous: bromocriptine, levetiracetam, phenytoin   Duplications in therapy: dual antiplatelet agents - indicated status post PCI with DES on 05/06/15  Gaps in therapy: none noted Medications to avoid in the elderly: pantoprazole (risk of Clostridium difficile infection and bone loss and fractures) Drug interactions: none noted Other issues noted: none noted   2.  Medication assistance:  I was unable to reach the patient for further assessment.     Plan: 1.  Medication review:  All medications appear appropriate based on patient's problem list.  Patient was hospitalized from 05/03/15 to 05/09/15 and was started on pantoprazole during hospitalization.  The beers list recommends to avoid scheduled use of proton pump inhibitors for greater than 8 weeks unless for high risk patients.  Please continue to weigh the risk versus benefit of long term proton pump inhibitor use.  Will send this information to patient's PCP.  No further issues noted.    2.  Medication assistance:  I called the patient to discuss medication assistance but I was unable to reach him.  There was no option to leave a voicemail.  I will make second outreach call on 05/25/15.     Elisabeth Most, Pharm.D. Pharmacy Resident [place] [phone]

## 2015-05-25 ENCOUNTER — Other Ambulatory Visit: Payer: Self-pay | Admitting: Pharmacist

## 2015-05-25 NOTE — Patient Outreach (Signed)
[place] [facility]) Care Management  [place]   05/25/2015  Duayne Brideau [birth year] [id number]  Subjective: Cristian Moss is a 70yo who was referred to [place] from [place] for medication assistance.  Per referral, patient reports difficulty with medication cost.    I made outreach call to patient.  I was unable to reach him.  No one answered the phone and there was no option to leave a voicemail.    Objective:   Current Medications: Current Outpatient Prescriptions  Medication Sig Dispense Refill  . albuterol (PROVENTIL HFA;VENTOLIN HFA) 108 (90 Base) MCG/ACT inhaler Inhale 2 puffs into the lungs every 6 (six) hours as needed for wheezing or shortness of breath. 1 Inhaler 0  . amoxicillin-clavulanate (AUGMENTIN) 875-125 MG tablet Take 1 tablet by mouth every 12 (twelve) hours. 48 tablet 0  . aspirin EC 81 MG EC tablet Take 1 tablet (81 mg total) by mouth daily.    Marland Kitchen atorvastatin (LIPITOR) 80 MG tablet Take 1 tablet (80 mg total) by mouth daily at 6 PM. 90 tablet 3  . bromocriptine (PARLODEL) 2.5 MG tablet Take 2.5 mg by mouth 2 (two) times daily.     . budesonide-formoterol (SYMBICORT) 160-4.5 MCG/ACT inhaler Inhale 2 puffs into the lungs 2 (two) times daily. 1 Inhaler 0  . clopidogrel (PLAVIX) 75 MG tablet Take 1 tablet (75 mg total) by mouth daily with breakfast. 90 tablet 3  . ferrous sulfate 325 (65 FE) MG tablet Take 1 tablet (325 mg total) by mouth 2 (two) times daily with a meal. 60 tablet 0  . levETIRAcetam (KEPPRA) 750 MG tablet Take 2 tablets (1,500 mg total) by mouth every 12 (twelve) hours. 120 tablet 2  . losartan (COZAAR) 50 MG tablet Take 1 tablet (50 mg total) by mouth daily. 90 tablet 3  . metoprolol tartrate (LOPRESSOR) 25 MG tablet Take 0.5 tablets (12.5 mg total) by mouth 2 (two) times daily. 90 tablet 3  . nicotine (NICODERM CQ) 14 mg/24hr patch Place 1 patch (14 mg total) onto the skin daily. 28 patch 0  . nitroGLYCERIN  (NITROSTAT) 0.4 MG SL tablet Place 1 tablet (0.4 mg total) under the tongue every 5 (five) minutes as needed for chest pain. FOR 3 DOSES ONLY 30 tablet 0  . oxyCODONE (OXY IR/ROXICODONE) 5 MG immediate release tablet Take 1 tablet (5 mg total) by mouth every 6 (six) hours as needed for moderate pain. 30 tablet 0  . pantoprazole (PROTONIX) 40 MG tablet Take 1 tablet (40 mg total) by mouth 2 (two) times daily. 60 tablet 0  . phenytoin (DILANTIN) 100 MG ER capsule Take 3 capsules (300 mg total) by mouth at bedtime. 90 capsule 0  . phenytoin (DILANTIN) 50 MG tablet Chew 1 tablet (50 mg total) by mouth at bedtime. Along with 300 mg-for a total of 350 mg daily 30 tablet 0  . polyethylene glycol (MIRALAX) packet Take 17 g by mouth daily. 14 each 0   No current facility-administered medications for this visit.    Functional Status: In your present state of health, do you have any difficulty performing the following activities: 05/18/2015 05/04/2015  Hearing? N -  Vision? N -  Difficulty concentrating or making decisions? Y -  Walking or climbing stairs? Y -  Dressing or bathing? Y -  Doing errands, shopping? Y N  Preparing Food and eating ? N -  Using the Toilet? N -  In the past six months, have you  accidently leaked urine? Y -  Do you have problems with loss of bowel control? N -  Managing your Medications? Y -  Managing your Finances? N -  Housekeeping or managing your Housekeeping? N -    Fall/Depression Screening: PHQ 2/9 Scores 05/19/2015 05/18/2015  PHQ - 2 Score 1 1    Assessment: 1.  Medication assistance:  I was unable to reach to patient for further assessment.     Plan: 1.  Medication assistance:  I called the patient to discuss medication assistance but was unable to reach him.  There was no option to leave a voicemail.  I will make a third outreach call within one week.    Elisabeth Most, Pharm.D. Pharmacy Resident [place] [phone]

## 2015-05-27 ENCOUNTER — Other Ambulatory Visit: Payer: Self-pay | Admitting: Pharmacist

## 2015-05-27 NOTE — Patient Outreach (Signed)
[place] [facility]) Care Management  05/27/2015  Cristian Moss 1945-11-29 [id number]   Cristian Moss is a 70yo who was referred to [place] from [place] for medication assistance.  Per referral, patient reports difficulty with medication cost.  I made outreach call to patient.  Patient answered and gave me permission to speak to his wife Mardene Celeste.  Patient and wife requested a home visit to review patient's medications and discuss medication assistance options.  Patient denies any questions or concerns with medications today and reports he has all medications at this time.  Home visit scheduled for 06/01/15 at 11:00 AM.     Elisabeth Most, Pharm.D. Pharmacy Resident [place] [phone]

## 2015-05-31 ENCOUNTER — Other Ambulatory Visit: Payer: Self-pay

## 2015-05-31 ENCOUNTER — Ambulatory Visit (HOSPITAL_COMMUNITY)
Admission: RE | Admit: 2015-05-31 | Discharge: 2015-05-31 | Disposition: A | Discharge status: Home / Self Care | Payer: PPO | Source: Ambulatory Visit | Attending: Cardiovascular Disease | Admitting: Cardiovascular Disease

## 2015-05-31 DIAGNOSIS — N183 Chronic kidney disease, stage 3 (moderate): Secondary | ICD-10-CM | POA: Diagnosis not present

## 2015-05-31 DIAGNOSIS — I70203 Unspecified atherosclerosis of native arteries of extremities, bilateral legs: Secondary | ICD-10-CM | POA: Insufficient documentation

## 2015-05-31 DIAGNOSIS — I129 Hypertensive chronic kidney disease with stage 1 through stage 4 chronic kidney disease, or unspecified chronic kidney disease: Secondary | ICD-10-CM | POA: Insufficient documentation

## 2015-05-31 DIAGNOSIS — I771 Stricture of artery: Secondary | ICD-10-CM | POA: Insufficient documentation

## 2015-05-31 DIAGNOSIS — R0989 Other specified symptoms and signs involving the circulatory and respiratory systems: Secondary | ICD-10-CM | POA: Insufficient documentation

## 2015-05-31 DIAGNOSIS — Z720 Tobacco use: Secondary | ICD-10-CM | POA: Insufficient documentation

## 2015-05-31 DIAGNOSIS — R938 Abnormal findings on diagnostic imaging of other specified body structures: Secondary | ICD-10-CM | POA: Insufficient documentation

## 2015-05-31 DIAGNOSIS — I739 Peripheral vascular disease, unspecified: Secondary | ICD-10-CM | POA: Diagnosis not present

## 2015-05-31 NOTE — Patient Outreach (Signed)
[facility]) Care Management  05/31/2015  Kaniel Kiang June 16, 1945 [id number]  Transition of care-Per Mrs. Mccarter, member is asleep. Mrs. Barnaby reports member went to an appointment with his primary care today. She expressed her concern that member's walking is not as good as it should be. Mrs. Seago reports she has discussed with primary care. Is awaiting follow up call from primary care regarding home health physical therapy. Mrs. Careaga denies any medication changes.. Mrs. Russ expresses concerns about member decreased appetite.   RNCM had an extensive discussion about providing dense packed food for gaining weight, recommend Mrs. Mcmullan monitor sodium content of foods such as soups. Ms. Sydnor reports she has bought nutritional supplements. Member to weigh at least weekly. RNCM encouraged frequent small meals and/or snacks. Discussed upcoming appointments.   Plan: telephonic call next week.  [name], RN, MSN, [place] Coordinator Cell: [phone]

## 2015-05-31 NOTE — Patient Outreach (Signed)
[facility]) Care Management  05/31/2015  Cristian Moss 1946/02/29 [id number]   Telephone outreach to patient to assist in completing the extra help application. Patient stated that I needed to speak with his wife and she wasn't at home at the time. I left him my contact information and requested that he have her call me. I will follow up in a few days.  M[facility] Odoherty L. Claire Dolores, [place] Care Management Assistant

## 2015-06-01 ENCOUNTER — Telehealth: Payer: Self-pay | Admitting: Neurology

## 2015-06-01 ENCOUNTER — Other Ambulatory Visit: Payer: Self-pay | Admitting: Pharmacist

## 2015-06-01 MED ORDER — PHENYTOIN 50 MG PO CHEW: 50.0000 mg | CHEWABLE_TABLET | Freq: Every day | ORAL | Status: AC

## 2015-06-01 NOTE — Telephone Encounter (Signed)
VM-Rachel from [facility] called in regards to PT and would like a call back/Dawn CB# [phone]

## 2015-06-02 ENCOUNTER — Other Ambulatory Visit: Payer: Self-pay | Admitting: Licensed Clinical Social Worker

## 2015-06-02 ENCOUNTER — Telehealth: Payer: Self-pay

## 2015-06-02 NOTE — Patient Outreach (Signed)
[place] [facility]) Care Management  06/02/2015  Cristian Moss Aug 23, 1945 [id number]   Assessment-CSW completed outreach to patient's home and spouse answered. Spouse shares that patient's health is improving but she feels that he needs to gain more weight. She shares that she has bought several packs of chocolate ensure to help. Spouse states that they have not be out to run errands yet with [employer] and therefore has not had Advance Directive notarized. Spouse reports that she plans to do so within the next 2 weeks. CSW will update goal. Spouse reports that [facility] Pharmacist was extremely helpful in educating them on patient's medications. Spouse appreciative of [facility] assistance.  Plan-CSW will complete next outreach in two weeks.  Eula Fried, BSW, MSW, [email or web address] Phone: [phone] Fax: [phone]

## 2015-06-02 NOTE — Telephone Encounter (Signed)
No.  I was treating him for his seizures.

## 2015-06-02 NOTE — Patient Outreach (Signed)
[facility]) Care Management  [place]   06/02/2015  Izsak Meir [birth year] [id number]  Subjective: Cristian Moss is a 70yo who was referred to [place] for medication management and medication assistance.  Per referral, patient reports difficulty with medication cost.  I made initial home visit today.  Patient's wife, Corby Vandenberghe, was present for visit.  Annie Sable, PharmD Candidate was also present during visit.    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  Patient also recently transitioned primary care providers from Dr. Forde Dandy at [facility] to Dr. Sheryn Bison at [facility].    Patient's wife assists patient in managing his medications.  She reports filling w weekly pill box for patient.  Patient's wife is using discharge mediation list to fill patient's pill box.  She reports patient is adherent with all medications.  She confirms patient has been taking antibiotics as prescribed and has two doses remaining.    Objective:   Current Medications: Current Outpatient Prescriptions  Medication Sig Dispense Refill  . albuterol (PROVENTIL HFA;VENTOLIN HFA) 108 (90 Base) MCG/ACT inhaler Inhale 2 puffs into the lungs every 6 (six) hours as needed for wheezing or shortness of breath. 1 Inhaler 0  . amoxicillin-clavulanate (AUGMENTIN) 875-125 MG tablet Take 1 tablet by mouth every 12 (twelve) hours. 48 tablet 0  . aspirin EC 81 MG EC tablet Take 1 tablet (81 mg total) by mouth daily.    Marland Kitchen atorvastatin (LIPITOR) 80 MG tablet Take 1 tablet (80 mg total) by mouth daily at 6 PM. 90 tablet 3  . bisacodyl (DULCOLAX) 5 MG EC tablet Take 5 mg by mouth daily as needed for moderate constipation.    . bromocriptine (PARLODEL) 2.5 MG tablet Take 2.5 mg by mouth 2 (two) times daily.     .  budesonide-formoterol (SYMBICORT) 160-4.5 MCG/ACT inhaler Inhale 2 puffs into the lungs 2 (two) times daily. 1 Inhaler 0  . clopidogrel (PLAVIX) 75 MG tablet Take 1 tablet (75 mg total) by mouth daily with breakfast. 90 tablet 3  . ferrous sulfate 325 (65 FE) MG tablet Take 1 tablet (325 mg total) by mouth 2 (two) times daily with a meal. 60 tablet 0  . levETIRAcetam (KEPPRA) 750 MG tablet Take 2 tablets (1,500 mg total) by mouth every 12 (twelve) hours. 120 tablet 2  . losartan (COZAAR) 50 MG tablet Take 1 tablet (50 mg total) by mouth daily. 90 tablet 3  . metoprolol tartrate (LOPRESSOR) 25 MG tablet Take 0.5 tablets (12.5 mg total) by mouth 2 (two) times daily. 90 tablet 3  . nitroGLYCERIN (NITROSTAT) 0.4 MG SL tablet Place 1 tablet (0.4 mg total) under the tongue every 5 (five) minutes as needed for chest pain. FOR 3 DOSES ONLY 30 tablet 0  . oxyCODONE (OXY IR/ROXICODONE) 5 MG immediate release tablet Take 1 tablet (5 mg total) by mouth every 6 (six) hours as needed for moderate pain. 30 tablet 0  . pantoprazole (PROTONIX) 40 MG tablet Take 1 tablet (40 mg total) by mouth 2 (two) times daily. 60 tablet 0  . phenytoin (DILANTIN) 100 MG ER capsule Take 3 capsules (300 mg total) by mouth at bedtime. 90 capsule 0  . nicotine (NICODERM CQ) 14 mg/24hr patch Place 1 patch (14 mg total) onto the skin daily. (Patient not taking: Reported on 06/01/2015) 28 patch 0  .  phenytoin (DILANTIN) 50 MG tablet Chew 1 tablet (50 mg total) by mouth at bedtime. Along with 300 mg-for a total of 350 mg daily 30 tablet 2  . polyethylene glycol (MIRALAX) packet Take 17 g by mouth daily. (Patient not taking: Reported on 06/01/2015) 14 each 0   No current facility-administered medications for this visit.    Functional Status: In your present state of health, do you have any difficulty performing the following activities: 05/18/2015 05/04/2015  Hearing? N -  Vision? N -  Difficulty concentrating or making decisions? Y -   Walking or climbing stairs? Y -  Dressing or bathing? Y -  Doing errands, shopping? Y N  Preparing Food and eating ? N -  Using the Toilet? N -  In the past six months, have you accidently leaked urine? Y -  Do you have problems with loss of bowel control? N -  Managing your Medications? Y -  Managing your Finances? N -  Housekeeping or managing your Housekeeping? N -    Fall/Depression Screening: PHQ 2/9 Scores 05/19/2015 05/18/2015  PHQ - 2 Score 1 1    Assessment:  Drugs sorted by system:  Neurologic/Psychologic: levetiracetam, phenytoin  Cardiovascular: aspirin, atorvastatin, clopidogrel, losartan, metoprolol tartrate, nitroglycerin SL tablet  Pulmonary/Allergy: albuterol HFA, budesonide-formoterol   Gastrointestinal: pantoprazole, bisacodyl, polyethylene glycol  Endocrine: none  Renal: none  Topical: nicotine patches  Pain: oxycodone  Vitamins/Minerals: ferrous sulfate  Infectious Diseases: amoxicillin-clavulanate  Miscellaneous: bromocriptine   Duplications in therapy: none noted Gaps in therapy: none - patient is on aspirin, ARB, statin, and beta-blocker Medications to avoid in the elderly: pantoprazole (risk of Clostridium difficile infection and bone loss and fractures) Drug interactions: aspirin and clopidogrel  - increased risk of bleeding; dual anti-platelet therapy is indicated status post PCI - patient is aware of signs and symptoms of bleeding  Other issues noted: on metoprolol tartrate which is not the approved formulation for HFrEF  1.  Medication management:  I reviewed purpose, proper use, and adverse effects of all medications.  Patient's wife had appropriately filled patient's pill box.  Patient's wife reports concern about obtaining prescription refills due to patient's recent transition in PCP.  Patient's wife reports many of the prescriptions patient was prescribed upon discharge from the hospital did not have refills.  I noted per chart  review that patient's cardiologist called in prescription refills for atorvastatin, clopidogrel, losartan, and metoprolol at hospital follow up.  I made outreach call to patient's PCP office and requested refills of ferrous sulfate, pantoprazole, and bromocriptine.  Amy at Dr. March Rummage office reports refills have already been sent in the the ferrous sulfate and pantoprazole.  Amy reports that Dr. Sheryn Bison states patient needs to get bromocriptine from his neurologist.  I called patient's neurologist, Dr. Georgie Chard office and requested refills of phenytoin 50 mg chewable tablets and bromocriptine.  Jada at Dr. Georgie Chard office reports that Dr. Tomi Likens has only ever seen patient for seizures and will not refill his bromocriptine.  I then left another message at patient's PCP office to inform Dr. Sheryn Bison that patient's neurologist will not refill bromocriptine.  I am waiting for a return call regarding bromocriptine.    2.  Medication assistance:  Patient reports difficulty with cost of his medications when he was discharged from the hospital.  Patient's wife reports their daughter assisted in paying for patient's medications.  I reviewed each medication and tier under patient's insurance.  Reviewed copay associated with each tier.  Patient is on  all tier 1 and 2 generic medications except for his inhalers (albuterol HFA and Symbicort).  Patient reports he has not needed to use albuterol HFA since being discharged from the hospital and will likely not need a refill until this prescription expires.  Patient reports concern about copay amount for Symbicort.  I discussed medication assistance options.  Reviewed qualifications for Extra Help and Symbicort patient assistance program.  Patient does not qualify for Extra Help based on joint income between he and his wife.  To qualify for Symbicort patient assistance, patient would have to have spent 3% of his total household income (approximately $900) on prescription medications  in the current calendar year or be in the donut hole.  I called patient's pharmacy with patient's permission.  Patient has spent $258.50 on prescription medications in the current calendar year and therefore does not qualify for Symbicort patient assistance program at this time.  I encouraged patient and/or his wife to reach out to me in the future if they spent more than $900 on medications this calendar year or go in the donut hole and I will assist with completion of the Symbicort patient assistance application.    Plan: 1.  Refill request for bromocriptine submitted to patient's new PCP Dr. Sheryn Bison, awaiting response.  Will update patient once I hear back from Dr. March Rummage office.    2.  I will send a letter to patient's provider requesting metoprolol tartrate be switched to metoprolol succinate.   3.  Patient to continue to take all medications as prescribed using pill box filled by patient's wife, Mardene Celeste.  4.  Follow up telephone call scheduled for 06/08/15.     Elisabeth Most, Pharm.D. Pharmacy Resident [place] [phone]

## 2015-06-02 NOTE — Telephone Encounter (Signed)
Social worker from [facility] called. Asked if you would refill bromocriptine (PARLODEL) 2.5 MG tablet, was being prescribed by PCP Dr. Forde Dandy, pt transferred care to [facility] Dr. Sheryn Bison, who refuses to refill. Please advise  [phone]

## 2015-06-02 NOTE — Telephone Encounter (Signed)
[place] social worker notified.

## 2015-06-04 ENCOUNTER — Encounter: Payer: Self-pay | Admitting: Pharmacist

## 2015-06-06 ENCOUNTER — Other Ambulatory Visit: Payer: Self-pay

## 2015-06-06 ENCOUNTER — Other Ambulatory Visit: Payer: Self-pay | Admitting: Pharmacist

## 2015-06-06 NOTE — Patient Outreach (Addendum)
[facility] [facility]) Care Management  06/06/2015  Cristian Moss [birth year] [id number]  Care Coordination-RNCM called to follow up with member. Spoke with member's wife-RNCM informed [name] that a message was left with primary care provider's nurse regarding member's pain requesting someone contact member. RNCM also encouraged [name] to contact provider also as needed. [name] states if she does not hear from primary care by tomorrow she will call. [name] thanked [facility].  Plan: RNCM will follow up by the end of the week.  [name], RN, MSN, [place] Coordinator Cell: [phone]

## 2015-06-06 NOTE — Patient Outreach (Signed)
[facility]) Care Management  06/06/2015  Cristian Moss 01/07/1946 [id number]   Subjective: " it is my back, my back is my main problem".  Assessment: RNCM called regarding transition of care. RNCM spoke with both member and his wife. [name] seemed somewhat agitated during the conversation. Asking questions why wouldn't the doctor have looked at his back then, he sat there and listened to him and my husband told him everything. Mr. Full reports his main problem today is lower back pain. Reports his pain began during his hospitalization following "the procedure". [name] reports member was prescribed pain medication for his back and states he is out of pain medication. [name] also asked why are the doctors not refilling bromocriptine. RNCM allowed [name] to discuss her concerns.  [name] asked about member's Symbicort stating it was not doing any good so she had to use the other one. RNCM reinforced per medication list, member should have an albuterol inhaler and a symbicort inhaler. Per discussion with [name], it sounds as if member has to Symbicort inhalers. [name] insist the pharmacist would have told her if member had two inhalers that were the same and attempted to describe the inhalers by the colors.  Plan: RNCM will follow up and update [facility] pharmacist, RNCM will call primary care regarding member's pain and continue to follow. Telephonic call next week.  [name], RN, MSN, [place] Coordinator Cell: [phone]

## 2015-06-06 NOTE — Patient Outreach (Signed)
[facility]) Care Management  06/06/2015  Cristian Moss 22-Feb-1946 [id number]  Care Coordination: RNCM called primary care provider. Left message with Dr. March Rummage nurse regarding member's lower back pain "8" on the pain scale started during hospitalization. Per member was prescribed pain medication when discharged from the hospital for back and is out of pain. Requesting primary care office call member to follow up on member's pain issue.  Plan: update member.  [name], RN, MSN, [place] Coordinator Cell: [phone]

## 2015-06-06 NOTE — Patient Outreach (Signed)
Cristian Moss) Care Management  Cristian Moss   06/06/2015  Cristian Moss 1945/12/29 [id number]  Subjective: Cristian Moss is a 70yo who was referred to Cristian Moss for medication management and medication assistance.  I received notification today from [place] that patient's wife had some confusion regarding patient's inhaler regimen.  I made outreach call to patient and spoke to patient's wife, Cristian Moss.  Cristian Moss reports there is no confusion regarding patient's inhalers and she confirms that the patient is using Symbicort 2 puffs BID and uses albuterol HFA if needed.  I reviewed purpose and proper use of Symbicort and albuterol inhaler.  Cristian Moss reports she continues to fill patient's pill box and reports she filled it yesterday without problems.  She reports patient is adherent with all medications.  I made outreach call to Cristian Moss to check on status of bromocriptine refill.  Cristian Moss confirms that patient's PCP did approve refill of bromocriptine.  I updated Cristian Moss on bromocriptine refill.    Objective:   Current Medications: Current Outpatient Prescriptions  Medication Sig Dispense Refill  . albuterol (PROVENTIL HFA;VENTOLIN HFA) 108 (90 Base) MCG/ACT inhaler Inhale 2 puffs into the lungs every 6 (six) hours as needed for wheezing or shortness of breath. 1 Inhaler 0  . amoxicillin-clavulanate (AUGMENTIN) 875-125 MG tablet Take 1 tablet by mouth every 12 (twelve) hours. 48 tablet 0  . aspirin EC 81 MG EC tablet Take 1 tablet (81 mg total) by mouth daily.    Marland Kitchen atorvastatin (LIPITOR) 80 MG tablet Take 1 tablet (80 mg total) by mouth daily at 6 PM. 90 tablet 3  . bisacodyl (DULCOLAX) 5 MG EC tablet Take 5 mg by mouth daily as needed for moderate constipation.    . bromocriptine (PARLODEL) 2.5 MG tablet Take 2.5 mg by mouth 2 (two) times daily.     . budesonide-formoterol (SYMBICORT) 160-4.5 MCG/ACT inhaler Inhale 2 puffs into the lungs 2 (two)  times daily. 1 Inhaler 0  . clopidogrel (PLAVIX) 75 MG tablet Take 1 tablet (75 mg total) by mouth daily with breakfast. 90 tablet 3  . ferrous sulfate 325 (65 FE) MG tablet Take 1 tablet (325 mg total) by mouth 2 (two) times daily with a meal. 60 tablet 0  . levETIRAcetam (KEPPRA) 750 MG tablet Take 2 tablets (1,500 mg total) by mouth every 12 (twelve) hours. 120 tablet 2  . losartan (COZAAR) 50 MG tablet Take 1 tablet (50 mg total) by mouth daily. 90 tablet 3  . metoprolol tartrate (LOPRESSOR) 25 MG tablet Take 0.5 tablets (12.5 mg total) by mouth 2 (two) times daily. 90 tablet 3  . nicotine (NICODERM CQ) 14 mg/24hr patch Place 1 patch (14 mg total) onto the skin daily. (Patient not taking: Reported on 06/01/2015) 28 patch 0  . nitroGLYCERIN (NITROSTAT) 0.4 MG SL tablet Place 1 tablet (0.4 mg total) under the tongue every 5 (five) minutes as needed for chest pain. FOR 3 DOSES ONLY 30 tablet 0  . oxyCODONE (OXY IR/ROXICODONE) 5 MG immediate release tablet Take 1 tablet (5 mg total) by mouth every 6 (six) hours as needed for moderate pain. 30 tablet 0  . pantoprazole (PROTONIX) 40 MG tablet Take 1 tablet (40 mg total) by mouth 2 (two) times daily. 60 tablet 0  . phenytoin (DILANTIN) 100 MG ER capsule Take 3 capsules (300 mg total) by mouth at bedtime. 90 capsule 0  . phenytoin (DILANTIN) 50 MG tablet Chew 1 tablet (50  mg total) by mouth at bedtime. Along with 300 mg-for a total of 350 mg daily 30 tablet 2  . polyethylene glycol (MIRALAX) packet Take 17 g by mouth daily. (Patient not taking: Reported on 06/01/2015) 14 each 0   No current facility-administered medications for this visit.   Functional Status: In your present state of health, do you have any difficulty performing the following activities: 05/18/2015 05/04/2015  Hearing? N -  Vision? N -  Difficulty concentrating or making decisions? Y -  Walking or climbing stairs? Y -  Dressing or bathing? Y -  Doing errands, shopping? Y N  Preparing  Food and eating ? N -  Using the Toilet? N -  In the past six months, have you accidently leaked urine? Y -  Do you have problems with loss of bowel control? N -  Managing your Medications? Y -  Managing your Finances? N -  Housekeeping or managing your Housekeeping? N -   Fall/Depression Screening: PHQ 2/9 Scores 05/19/2015 05/18/2015  PHQ - 2 Score 1 1   Assessment: 1.  Medication management:  Patient's wife assists patient in managing his medications and denies any difficulty filling patient's pill box.  Patient now has refills of the following discharge medications (atorvastatin, bromocriptine, Symbicort, clopidogrel, ferrous sulfate, levetiracetam, losartan, metoprolol, pantoprazole, and phenytoin) on file at [facility] Moss.  I have updated patient and his wife regarding available medication refills and advised them to call Cristian Moss when patient needs a refill.  Patient's wife denies any further pharmacy needs at this time.    2.  Medication assistance:  Patient does not qualify for Extra Help or Symbicort patient assistance program at this time.  I encouraged patient and/or his wife to reach out to me in the future if they spend 3% of their income on medications this calendar year or go in the donut hole and I will assist with completion of the Symbicort patient assistance application.    Plan: 1.  Patient now has refills of his discharge medications available at [facility] Moss.  Advised patient's wife to call Cristian Moss for delivery of refills when needed.  2.  Patient to continue to take all medications as prescribed using pill box filled by patient's wife.   3.  Will close pharmacy program as no further pharmacy needs identified.  Provided patient with my phone number and eligibility requirements for [facility] patient assistance program.  Advised patient and/or his wife to call me in the future if they spend 3% of their income on medications this calendar year or go in the donut hole and  I will assist with completion of the Symbicort patient assistance application or they can call me if they have any questions or concerns regarding patient's medications.  I have updated [place] regarding pharmacy program closure.     Elisabeth Most, Pharm.D. Pharmacy Resident [place] [phone]

## 2015-06-07 ENCOUNTER — Other Ambulatory Visit: Payer: Self-pay

## 2015-06-07 ENCOUNTER — Telehealth (HOSPITAL_COMMUNITY): Payer: Self-pay | Admitting: Cardiac Rehabilitation

## 2015-06-07 ENCOUNTER — Other Ambulatory Visit: Payer: Self-pay | Admitting: Pharmacist

## 2015-06-07 NOTE — Patient Outreach (Addendum)
[name]) Care Management  06/07/2015  Cristian Moss Aug 16, 1945 [id number]  Subjective: "I am having the same pain I had in the hospital, lower back pain"  Assessment: RNCM returned call to member. Member states he continues to have ongoing pain that started in the hospital. Member states he was given pain medication for lower back pain at discharge and is now out of pain medication. Member gave verbal consent to speak with daughter, Cristian Moss. Ms. Karlton Lemon reports she has been trying to get in contact with a provider at [facility] and has not been able to speak with anyone- Only able to leave voice message and has not heard back from anyone.   RNCM called Primary care office and spoke with receptionist who reports member can go to walk in clinic today or scheduled an appointment to see Dr. Sheryn Bison at 12noon tomorrow.   RNCM follow up with member-spoke with Cristian Moss, Member's wife also audible to conversation per speaker phone. Informed that member could be seen in the walk in clinic. Also that appointment possible for tomorrow at 12 per office staff. RNCM encouraged daughter to call press option 1 to speak with someone/appointments.  RNCM encouraged member to call RNCM as needed. Ms. Klemann confirmed that she has RNCM's contact number. Member has appointments scheduled 3/15 and 3/16 regarding Pulmonology.  Plan: RNCM will follow up telephonically by the end of the week.  [name], RN, MSN, [place] Coordinator Cell: [phone]

## 2015-06-07 NOTE — Patient Outreach (Signed)
[facility]) Care Management  06/07/2015  Deamonte Sayegh 02-10-1946 [id number]  Care Coordination-RNCM return call to Cardiac rehabilitation nurse Suszanne Conners. Ms. Ashley Jacobs informed RNCM of member's declining cardiac rehabilitation services/ot ready for. Also discussed member's report of fall/weakness and decreased appetite.  RNCM informed Ms. Rion that his primary concern today is related to pain.  Plan: RNCM will continue to follow member, follow up regarding home health and nutrition.   [name], RN, MSN, [place] Coordinator Cell: [phone]

## 2015-06-07 NOTE — Telephone Encounter (Signed)
pc to pt to assess readiness for cardiac rehab. Pt dtr states pt has poor PO intake due to poor appetite and generalized weakness that has worsened after fall last week.  Pt dtr and wife unsure of exact date of episode and reports pt has not been evaluated following the event. Pt dtr reports pt does not have transportation and is not interested in SCAT at this time. Pt dtr requests home health PT and home nutrition consult. Pt is also working with [facility].  Message sent [facility] nurse to assess if they consult referrals have been made.  Pt dtr instructed to contact pt PCP, Dr. Sheryn Bison re: new onset weakness associated with fall and poor PO intake.  Understanding verbalized.

## 2015-06-07 NOTE — Patient Outreach (Addendum)
[facility]) Care Management  06/07/2015  Maher Shon February 29, 1946 [id number]  Asessment: Follow up call to see if daughter, Ms. Karlton Lemon, was able to reach the primary care provider. RNCM spoke with member's daughter, per member's permission. Mrs. Karlton Lemon states that member has an appointment scheduled with Dr. Sheryn Bison on tomorrow at 31. Ms. Karlton Lemon also reports that Dr. March Rummage nurse called and instructed member to have additional imaging tomorrow on his lower back after CT scan previously scheduled for tomorrow. RNCM discussed home health and instructed Ms. Karlton Lemon to discuss home health physical therapy and possibly bath aide referral. Also instructed to discuss member's decreased appetite and nutritional consult if recommended.  Ms. Karlton Lemon discussed personal care services-RNCM instructed personal care services not covered by Medicare. Discussed the [place] program. Ms. Karlton Lemon declines other private pay resources at this time.  Ms. Shelton asked about blister packs. Ms. Karlton Lemon spoke with Elisabeth Most, pharmacy resident. Information/Resources provided.   Plan: Since RNCM followed up today and confirmed that member has been in contact with primary care regarding pain, RNCM will not call at the end of the week, but will make home visit next week. Refer to community health response program to be placed on the list.  [name], RN, MSN, [place] Coordinator Cell: [phone]

## 2015-06-07 NOTE — Patient Outreach (Signed)
[facility]) Care Management  06/07/2015  Cristian Moss 11/26/1945 [id number]   Cristian Moss is a 70yo who was referred to [place] for medication management and medication assistance.  I received notification today that patient's daughter, Cristian Moss, wanted to speak to me regarding pharmacies that will blister package medications.  I spoke to Cristian Moss who reports she believes that having patient's medication blister packaged at the pharmacy would help reduce stress for her mother/patient's wife.  Patient currently fills his medications at [facility] which offers a delivery service.  I informed Cristian Moss that [facility] does not currently blister package medications for patients.  Therefore, in order to have medications blister packaged, patient would have to change pharmacies.  I provided Cristian Moss with the name of several pharmacies in the [place] area that will blister pack and deliver medications with no additional charge including [facility] and [facility].  Cristian Moss reports she will talk to her father and mother regarding pharmacies that blister package and deliver medications.  I encouraged Cristian Moss to contact me if patient chooses to change pharmacies to one that blister packs and needs any assistance with the transition.     Cristian Moss, Pharm.D. Pharmacy Resident [place] [phone]

## 2015-06-08 ENCOUNTER — Ambulatory Visit
Admission: RE | Admit: 2015-06-08 | Discharge: 2015-06-08 | Disposition: A | Discharge status: Home / Self Care | Payer: PPO | Source: Ambulatory Visit | Attending: Family Medicine | Admitting: Family Medicine

## 2015-06-08 ENCOUNTER — Ambulatory Visit (INDEPENDENT_AMBULATORY_CARE_PROVIDER_SITE_OTHER)
Admission: RE | Admit: 2015-06-08 | Discharge: 2015-06-08 | Disposition: A | Discharge status: Home / Self Care | Payer: PPO | Source: Ambulatory Visit | Attending: Pulmonary Disease | Admitting: Pulmonary Disease

## 2015-06-08 ENCOUNTER — Other Ambulatory Visit: Payer: Self-pay | Admitting: Pharmacist

## 2015-06-08 ENCOUNTER — Inpatient Hospital Stay: Payer: Self-pay | Admitting: Pulmonary Disease

## 2015-06-08 ENCOUNTER — Other Ambulatory Visit: Payer: Self-pay | Admitting: Family Medicine

## 2015-06-08 DIAGNOSIS — M545 Low back pain: Secondary | ICD-10-CM

## 2015-06-08 DIAGNOSIS — J984 Other disorders of lung: Secondary | ICD-10-CM | POA: Diagnosis not present

## 2015-06-09 ENCOUNTER — Encounter: Payer: Self-pay | Admitting: Pulmonary Disease

## 2015-06-09 ENCOUNTER — Ambulatory Visit (INDEPENDENT_AMBULATORY_CARE_PROVIDER_SITE_OTHER): Payer: PPO | Admitting: Pulmonary Disease

## 2015-06-09 VITALS — BP 116/68 | HR 90 | Ht 70.0 in | Wt 166.2 lb

## 2015-06-09 DIAGNOSIS — J984 Other disorders of lung: Secondary | ICD-10-CM | POA: Diagnosis not present

## 2015-06-09 NOTE — Patient Instructions (Signed)
I will discuss with your cardiologist if you can be admitted for Integrilin bridge in order to do the biopsy.  We will make follow-up appointment for you in 1 month time and also refer you to oncology.

## 2015-06-09 NOTE — Progress Notes (Signed)
Subjective:    Patient ID: Cristian Moss, male    DOB: [birth year], 70 y.o.   MRN: [id number]  HPI Follow up after hospitalization for NSTMI, lung mass.  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 Admitted on 05/04/15 with breakthrough seizures. He was also evaluated by cardiology for chest pain, elevated cardiac enzymes. He received a drug-eluting stent to RCA on 2/11. He was found to have right lung capacity and an adrenal lesion concerning for malignancy. Biopsy was held as he was on dual platelet therapy. Follow-up CT scan shows enlarging lung mass and adrenal mass. He reports feeling weak and fatigued since his discharge. Denies any cough, sputum production, hemoptysis.  DATA: CT scan 06/08/15 IImages reviewd. Interval growth of right upper lobe mass,. Now measuring 8.9 cm. Patchy consolidation in RUL, [address]ilar LN, adrenal mass.  Past Medical History  Diagnosis Date  . Essential hypertension   . Seizures ([place])   . COPD (chronic obstructive pulmonary disease) ([place])   . Thrombocytopenia ([place])   . 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 BPH (benign prostatic hypertrophy)   . Erectile dysfunction   . Empty sella syndrome ([place])     History of  . CKD (chronic kidney disease), stage III   . Hyponatremia     history of  . Tobacco abuse     Current outpatient prescriptions:  .  albuterol (PROVENTIL HFA;VENTOLIN HFA) 108 (90 Base) MCG/ACT inhaler, Inhale 2 puffs into the lungs every 6 (six) hours as needed for wheezing or shortness of breath., Disp: 1 Inhaler, Rfl: 0 .  aspirin EC 81 MG EC tablet, Take 1 tablet (81 mg total) by mouth daily., Disp: , Rfl:  .  atorvastatin (LIPITOR) 80 MG tablet, Take 1 tablet (80 mg total) by mouth daily at 6 PM., Disp: 90 tablet, Rfl: 3 .  bisacodyl (DULCOLAX) 5 MG EC tablet, Take 5 mg by mouth daily as needed for moderate constipation., Disp: , Rfl:  .  bromocriptine (PARLODEL) 2.5 MG tablet, Take 2.5 mg by mouth 2 (two) times daily. , Disp: ,  Rfl:  .  budesonide-formoterol (SYMBICORT) 160-4.5 MCG/ACT inhaler, Inhale 2 puffs into the lungs 2 (two) times daily., Disp: 1 Inhaler, Rfl: 0 .  clopidogrel (PLAVIX) 75 MG tablet, Take 1 tablet (75 mg total) by mouth daily with breakfast., Disp: 90 tablet, Rfl: 3 .  ferrous sulfate 325 (65 FE) MG tablet, Take 1 tablet (325 mg total) by mouth 2 (two) times daily with a meal., Disp: 60 tablet, Rfl: 0 .  levETIRAcetam (KEPPRA) 750 MG tablet, Take 2 tablets (1,500 mg total) by mouth every 12 (twelve) hours., Disp: 120 tablet, Rfl: 2 .  losartan (COZAAR) 50 MG tablet, Take 1 tablet (50 mg total) by mouth daily., Disp: 90 tablet, Rfl: 3 .  metoprolol tartrate (LOPRESSOR) 25 MG tablet, Take 0.5 tablets (12.5 mg total) by mouth 2 (two) times daily., Disp: 90 tablet, Rfl: 3 .  nicotine (NICODERM CQ) 14 mg/24hr patch, Place 1 patch (14 mg total) onto the skin daily., Disp: 28 patch, Rfl: 0 .  nitroGLYCERIN (NITROSTAT) 0.4 MG SL tablet, Place 1 tablet (0.4 mg total) under the tongue every 5 (five) minutes as needed for chest pain. FOR 3 DOSES ONLY, Disp: 30 tablet, Rfl: 0 .  pantoprazole (PROTONIX) 40 MG tablet, Take 1 tablet (40 mg total) by mouth 2 (two) times daily., Disp: 60 tablet, Rfl: 0 .  phenytoin (DILANTIN) 100 MG ER capsule, Take 3 capsules (300 mg  total) by mouth at bedtime., Disp: 90 capsule, Rfl: 0 .  phenytoin (DILANTIN) 50 MG tablet, Chew 1 tablet (50 mg total) by mouth at bedtime. Along with 300 mg-for a total of 350 mg daily, Disp: 30 tablet, Rfl: 2 .  oxyCODONE (OXY IR/ROXICODONE) 5 MG immediate release tablet, Take 1 tablet (5 mg total) by mouth every 6 (six) hours as needed for moderate pain. (Patient not taking: Reported on 06/09/2015), Disp: 30 tablet, Rfl: 0 .  polyethylene glycol (MIRALAX) packet, Take 17 g by mouth daily. (Patient not taking: Reported on 06/09/2015), Disp: 14 each, Rfl: 0   Review of Systems Malaise, fatigue, loss of weight and appetite. No cough, sputum production,  fevers or chills No chest pain, palpitation No fevers, chills,  Denies nausea, vomiting, diarrhea, constipatin All other ROS are negative.    Objective:   Physical Exam Blood pressure 116/68, pulse 90, height '5\' 10"'$  (1.778 m), weight 166 lb 3.2 oz (75.388 kg), SpO2 89 %. Gen: Mild distress, Ill appearing AA man Neuro: No gross focal deficits. HEENT: No JVD, lymphadenopathy, thyromegaly.  RS: Rt rhonchi, No crackles CVS: S1-S2 heard, no murmurs rubs gallops. Abdomen: Soft, positive bowel sounds. Extremities: No edema.    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 This is a difficult situation as he is on aspirin and plavix since feb 17 for coronary stent. As per the cardiology recommendations these cannot be stopped for atleast 3 months. The most recent CT scan was discussed with the pt, his wife and daughter. They are concerned about the growth in tumor within 1 month. This appears to be an aggressive malignancy, maybe small cell. They are anxious to get a diagnosis and get started on therapy as soon as possible.  Cardiology had mentioned stopping antiplatelets with integrilin bridging after 1 month of stent placement. He would need to be admitted for this. I will discuss with his cardiologist if this is something that can be arranged. I will also get a PET scan in the meantime.  Marshell Garfinkel MD [place] [facility] Pager [phone] If no answer or after 3pm call: [phone] 06/11/2015, 1:07 AM

## 2015-06-09 NOTE — Patient Outreach (Signed)
[facility] [facility]) Care Management  06/09/2015  Cristian Moss 07-04-1945 [id number]   Telephone outreach to patients daughter to follow up on request sent to me from Mariann Laster, RN regarding extra help assistance. I informed her that due to the patient's household income they did not qualify for extra help. She verbalized understanding.  [name] L. Jameela Michna, [place] Care Management Assistant

## 2015-06-10 ENCOUNTER — Telehealth: Payer: Self-pay | Admitting: Pulmonary Disease

## 2015-06-10 ENCOUNTER — Other Ambulatory Visit: Payer: Self-pay

## 2015-06-10 ENCOUNTER — Telehealth: Payer: Self-pay | Admitting: Cardiovascular Disease

## 2015-06-10 NOTE — Telephone Encounter (Signed)
FORWARD TO DR Gwenlyn Found AND [facility] RN  NEED LUNG BIOPSY  BY PULMONOLOGY (JESSICA JONES FOR DR [place])

## 2015-06-10 NOTE — Telephone Encounter (Signed)
Left message on voicemail for Cristian Moss to return my call.

## 2015-06-10 NOTE — Telephone Encounter (Signed)
FORWARD TO JESSICA JONES,CMA

## 2015-06-10 NOTE — Telephone Encounter (Signed)
Patient needs to stop his ASA and Plavix for biopsy.    Cristian Moss was last seen on 05/18/15 by Tarri Fuller .Marland KitchenThanks.

## 2015-06-10 NOTE — Patient Outreach (Signed)
[facility]) Care Management  06/10/2015  Cristian Moss 02/09/1946 [id number]   RNCM called Community health Response Program-per daughter's request. Member placed on list for personal care assistance.  Plan: continue to follow.  [name], RN, MSN, [place] Coordinator Cell: [phone]

## 2015-06-10 NOTE — Telephone Encounter (Signed)
Ivin Booty called and wants [place] to forward to Dr. Vaughan Browner Dr. Kennon Holter response below. Please advise thanks

## 2015-06-10 NOTE — Telephone Encounter (Signed)
He just had a DES to RCA 1 month ago . He CAN NOT stop DAPT for at least 6 months, 1 year preferable

## 2015-06-10 NOTE — Telephone Encounter (Signed)
UNSURE ABOUT THE TONE AND CONFUSION OF THIS MESSAGE PM had requested pt be seen by Dr Gwenlyn Found or PA as soon as possible (as stated in the referral) to discuss his anticoag therapy for biopsy - NOT to stop therapy.  Apologies for any confusion Will call cardiology on Monday to see if this can be clarified

## 2015-06-13 ENCOUNTER — Telehealth: Payer: Self-pay

## 2015-06-13 ENCOUNTER — Telehealth: Payer: Self-pay | Admitting: Cardiovascular Disease

## 2015-06-13 ENCOUNTER — Telehealth: Payer: Self-pay | Admitting: Pulmonary Disease

## 2015-06-13 NOTE — Telephone Encounter (Signed)
F/u  Pt wife returning RN phon ecall. Please call back and discuss.   

## 2015-06-13 NOTE — Telephone Encounter (Signed)
Spoke with Janett Billow from Dr. Matilde Bash office to clarify that since pt cannot stop ASA and Plavix after 1 month of starting therapy can he he be setup for an admitted for an Integrilin bridge. Will send message to Dr. Gwenlyn Found to review it his is an option as there is urgency with wanting to get biopsy done soon. Will forward to Dr. Gwenlyn Found to review options.

## 2015-06-13 NOTE — Telephone Encounter (Signed)
Is this my patient? I don't see that I was part of his care  JJB

## 2015-06-13 NOTE — Telephone Encounter (Signed)
-----   Message from Brett Canales, PA-C sent at 06/07/2015  5:22 PM EDT ----- Please schedule lower extremity angiogram with Dr. Gwenlyn Found for Mr Sowles. His Doppler study showed significant disease.  I tried to call the patient twice but no answer.  Make sure Nicki Reaper from [place] is there.    Thanks, Tarri Fuller, [facility]

## 2015-06-13 NOTE — Telephone Encounter (Signed)
New Message:    Please call her concerning Cristian Moss.

## 2015-06-13 NOTE — Telephone Encounter (Signed)
Duplicate msg.

## 2015-06-13 NOTE — Telephone Encounter (Signed)
Spoke with Cristian Moss w/ Dr Gwenlyn Found about biopsy and bridging pt

## 2015-06-13 NOTE — Telephone Encounter (Signed)
Spoke with pt daughter as she live in [place]. Reviewed plans for PV Angiogram. She is going to speak with pt and mother to make decision. Gave next available dates and she is going to work to schedule ASAP.  Also she is aware the [place]. Mannam and Dr. Gwenlyn Found are going to be in touch to setup plan to complete lung biopsy. She verbalized understanding, no additional questions at this time.

## 2015-06-13 NOTE — Telephone Encounter (Signed)
Spoke with pt's spouse, wants to know what the next steps are with his care.  I advised that per pt's visit on Thursday with PM that PM and Dr. Gwenlyn Found were going to discuss a plan of care that works for both his pulmonary and cardiology needs.  I advised that once we heard from both the doctors that we would be in contact with pt and wife.  Pt's wife expressed understanding.  Nothing further needed at this time.

## 2015-06-13 NOTE — Telephone Encounter (Signed)
Pt needs to be scheduled for PV angiogram.  Tried to call pt multiple times but unable to reach and no VM setup.  Letter mailed to pt to call office and setup PV Angio with Dr. Gwenlyn Found.

## 2015-06-13 NOTE — Telephone Encounter (Signed)
Follow Up  Pt request a call back to discuss more in detail ( This is important)

## 2015-06-15 ENCOUNTER — Ambulatory Visit: Payer: Self-pay

## 2015-06-15 ENCOUNTER — Other Ambulatory Visit: Payer: Self-pay

## 2015-06-15 NOTE — Patient Outreach (Addendum)
[facility]) Care Management  06/15/2015  Cristian Moss 1945/08/18 [id number]  RNCM planned home visit, however RNCM unable to make home visit-called to follow up/reschedule. Spoke with member's wife(caregiver) who reports he has seen providers as scheduled. Member has decreased ability to ambulate and the doctors are aware. [name] reports that member may be having a procedure next week. [name] is unable to establish a new home visit date at this time.  [name] has RNCM's contact number, encouraged to call as needed.  Plan: follow up in two weeks.  [name], RN, MSN, [facility] Coordinator Cell: [phone]

## 2015-06-16 ENCOUNTER — Encounter: Payer: Self-pay | Admitting: Cardiovascular Disease

## 2015-06-16 ENCOUNTER — Telehealth: Payer: Self-pay | Admitting: *Deleted

## 2015-06-16 ENCOUNTER — Other Ambulatory Visit: Payer: Self-pay

## 2015-06-16 DIAGNOSIS — I739 Peripheral vascular disease, unspecified: Secondary | ICD-10-CM

## 2015-06-16 NOTE — Telephone Encounter (Signed)
Left message for pt daughter on her cell phone.   Called pt home spoke with pt and wife. Given details of pt procedure scheduled on 3/27 @ 8am. Pt to arrive at 5:30am to have pre-op blood work completed before hand.  Pt aware he needs to be NPO night before with small sip of water in morning to take medication.  Pt verbalized understanding, no additional questions at this time.

## 2015-06-16 NOTE — Telephone Encounter (Signed)
Left message with Nicki Reaper regarding procedure scheduled for this Monday 06/20/15 @ 8:00 am.

## 2015-06-16 NOTE — Telephone Encounter (Signed)
New message     Daughter states that a nurse called the patient a few days ago and told him about a procedure he is to have this Monday the 27th.  Please call patient and answer his questions regarding what is involved, where should he go, etc.

## 2015-06-17 ENCOUNTER — Other Ambulatory Visit: Payer: Self-pay | Admitting: Licensed Clinical Social Worker

## 2015-06-17 NOTE — Patient Outreach (Signed)
[facility]) Care Management  06/17/2015  Cristian Moss Dec 13, 1945 [id number]   Assessment-CSW completed outreach to patient's residence on 06/17/15.  Patient or spouse did not answer. CSW left HIPPA compliant voice message encouraging return phone call.  Plan-CSW will await call back from family or complete an additional outreach next week.  Cristian Moss, Cristian Moss, Cristian Moss, [email or web address] Phone: [phone] Fax: [phone]

## 2015-06-20 ENCOUNTER — Encounter (HOSPITAL_COMMUNITY): Payer: Self-pay | Admitting: Cardiovascular Disease

## 2015-06-20 ENCOUNTER — Encounter (HOSPITAL_COMMUNITY)
Admission: AD | Disposition: A | Discharge status: Hospice | Payer: Self-pay | Source: Ambulatory Visit | Attending: Internal Medicine

## 2015-06-20 ENCOUNTER — Other Ambulatory Visit: Payer: Self-pay | Admitting: Licensed Clinical Social Worker

## 2015-06-20 ENCOUNTER — Inpatient Hospital Stay (HOSPITAL_COMMUNITY)
Admission: AD | Admit: 2015-06-20 | Discharge: 2015-06-30 | DRG: 270 | Disposition: A | Discharge status: Hospice | Payer: PPO | Source: Ambulatory Visit | Attending: Internal Medicine | Admitting: Internal Medicine

## 2015-06-20 ENCOUNTER — Other Ambulatory Visit: Payer: Self-pay

## 2015-06-20 ENCOUNTER — Ambulatory Visit (HOSPITAL_COMMUNITY): Payer: PPO

## 2015-06-20 DIAGNOSIS — J95821 Acute postprocedural respiratory failure: Secondary | ICD-10-CM | POA: Diagnosis not present

## 2015-06-20 DIAGNOSIS — I5031 Acute diastolic (congestive) heart failure: Secondary | ICD-10-CM | POA: Diagnosis present

## 2015-06-20 DIAGNOSIS — E860 Dehydration: Secondary | ICD-10-CM | POA: Diagnosis present

## 2015-06-20 DIAGNOSIS — Z955 Presence of coronary angioplasty implant and graft: Secondary | ICD-10-CM

## 2015-06-20 DIAGNOSIS — C3490 Malignant neoplasm of unspecified part of unspecified bronchus or lung: Secondary | ICD-10-CM | POA: Diagnosis present

## 2015-06-20 DIAGNOSIS — I959 Hypotension, unspecified: Secondary | ICD-10-CM | POA: Diagnosis present

## 2015-06-20 DIAGNOSIS — R5383 Other fatigue: Secondary | ICD-10-CM | POA: Diagnosis present

## 2015-06-20 DIAGNOSIS — J9690 Respiratory failure, unspecified, unspecified whether with hypoxia or hypercapnia: Secondary | ICD-10-CM

## 2015-06-20 DIAGNOSIS — G40909 Epilepsy, unspecified, not intractable, without status epilepticus: Secondary | ICD-10-CM

## 2015-06-20 DIAGNOSIS — I739 Peripheral vascular disease, unspecified: Secondary | ICD-10-CM | POA: Diagnosis not present

## 2015-06-20 DIAGNOSIS — Z8249 Family history of ischemic heart disease and other diseases of the circulatory system: Secondary | ICD-10-CM

## 2015-06-20 DIAGNOSIS — C7972 Secondary malignant neoplasm of left adrenal gland: Secondary | ICD-10-CM | POA: Diagnosis present

## 2015-06-20 DIAGNOSIS — J440 Chronic obstructive pulmonary disease with acute lower respiratory infection: Secondary | ICD-10-CM | POA: Diagnosis present

## 2015-06-20 DIAGNOSIS — E872 Acidosis: Secondary | ICD-10-CM | POA: Diagnosis present

## 2015-06-20 DIAGNOSIS — R6510 Systemic inflammatory response syndrome (SIRS) of non-infectious origin without acute organ dysfunction: Secondary | ICD-10-CM

## 2015-06-20 DIAGNOSIS — R05 Cough: Secondary | ICD-10-CM

## 2015-06-20 DIAGNOSIS — Z77011 Contact with and (suspected) exposure to lead: Secondary | ICD-10-CM | POA: Diagnosis present

## 2015-06-20 DIAGNOSIS — M549 Dorsalgia, unspecified: Secondary | ICD-10-CM

## 2015-06-20 DIAGNOSIS — Z87891 Personal history of nicotine dependence: Secondary | ICD-10-CM

## 2015-06-20 DIAGNOSIS — N400 Enlarged prostate without lower urinary tract symptoms: Secondary | ICD-10-CM | POA: Diagnosis present

## 2015-06-20 DIAGNOSIS — E46 Unspecified protein-calorie malnutrition: Secondary | ICD-10-CM | POA: Diagnosis present

## 2015-06-20 DIAGNOSIS — Z720 Tobacco use: Secondary | ICD-10-CM

## 2015-06-20 DIAGNOSIS — R627 Adult failure to thrive: Secondary | ICD-10-CM | POA: Diagnosis present

## 2015-06-20 DIAGNOSIS — R000 Tachycardia, unspecified: Secondary | ICD-10-CM | POA: Diagnosis present

## 2015-06-20 DIAGNOSIS — E861 Hypovolemia: Secondary | ICD-10-CM | POA: Diagnosis present

## 2015-06-20 DIAGNOSIS — A419 Sepsis, unspecified organism: Secondary | ICD-10-CM | POA: Diagnosis not present

## 2015-06-20 DIAGNOSIS — M545 Low back pain, unspecified: Secondary | ICD-10-CM | POA: Diagnosis present

## 2015-06-20 DIAGNOSIS — R0602 Shortness of breath: Secondary | ICD-10-CM

## 2015-06-20 DIAGNOSIS — R778 Other specified abnormalities of plasma proteins: Secondary | ICD-10-CM | POA: Diagnosis present

## 2015-06-20 DIAGNOSIS — I70211 Atherosclerosis of native arteries of extremities with intermittent claudication, right leg: Secondary | ICD-10-CM | POA: Diagnosis not present

## 2015-06-20 DIAGNOSIS — N183 Chronic kidney disease, stage 3 unspecified: Secondary | ICD-10-CM | POA: Diagnosis present

## 2015-06-20 DIAGNOSIS — I2510 Atherosclerotic heart disease of native coronary artery without angina pectoris: Secondary | ICD-10-CM | POA: Diagnosis present

## 2015-06-20 DIAGNOSIS — J438 Other emphysema: Secondary | ICD-10-CM | POA: Diagnosis present

## 2015-06-20 DIAGNOSIS — Z7982 Long term (current) use of aspirin: Secondary | ICD-10-CM

## 2015-06-20 DIAGNOSIS — R748 Abnormal levels of other serum enzymes: Secondary | ICD-10-CM | POA: Diagnosis present

## 2015-06-20 DIAGNOSIS — I10 Essential (primary) hypertension: Secondary | ICD-10-CM | POA: Diagnosis present

## 2015-06-20 DIAGNOSIS — J984 Other disorders of lung: Secondary | ICD-10-CM | POA: Diagnosis not present

## 2015-06-20 DIAGNOSIS — R7989 Other specified abnormal findings of blood chemistry: Secondary | ICD-10-CM

## 2015-06-20 DIAGNOSIS — I130 Hypertensive heart and chronic kidney disease with heart failure and stage 1 through stage 4 chronic kidney disease, or unspecified chronic kidney disease: Secondary | ICD-10-CM | POA: Diagnosis present

## 2015-06-20 DIAGNOSIS — J432 Centrilobular emphysema: Secondary | ICD-10-CM | POA: Diagnosis present

## 2015-06-20 DIAGNOSIS — C3411 Malignant neoplasm of upper lobe, right bronchus or lung: Secondary | ICD-10-CM | POA: Diagnosis present

## 2015-06-20 DIAGNOSIS — J189 Pneumonia, unspecified organism: Secondary | ICD-10-CM | POA: Diagnosis present

## 2015-06-20 DIAGNOSIS — R918 Other nonspecific abnormal finding of lung field: Secondary | ICD-10-CM

## 2015-06-20 DIAGNOSIS — E278 Other specified disorders of adrenal gland: Secondary | ICD-10-CM | POA: Diagnosis present

## 2015-06-20 DIAGNOSIS — R059 Cough, unspecified: Secondary | ICD-10-CM

## 2015-06-20 DIAGNOSIS — R64 Cachexia: Secondary | ICD-10-CM | POA: Diagnosis present

## 2015-06-20 DIAGNOSIS — N529 Male erectile dysfunction, unspecified: Secondary | ICD-10-CM | POA: Diagnosis present

## 2015-06-20 DIAGNOSIS — J9601 Acute respiratory failure with hypoxia: Secondary | ICD-10-CM | POA: Diagnosis present

## 2015-06-20 DIAGNOSIS — Z6824 Body mass index (BMI) 24.0-24.9, adult: Secondary | ICD-10-CM

## 2015-06-20 DIAGNOSIS — N2889 Other specified disorders of kidney and ureter: Secondary | ICD-10-CM | POA: Diagnosis present

## 2015-06-20 DIAGNOSIS — I2583 Coronary atherosclerosis due to lipid rich plaque: Secondary | ICD-10-CM | POA: Diagnosis present

## 2015-06-20 DIAGNOSIS — Z79899 Other long term (current) drug therapy: Secondary | ICD-10-CM

## 2015-06-20 DIAGNOSIS — E871 Hypo-osmolality and hyponatremia: Secondary | ICD-10-CM | POA: Diagnosis present

## 2015-06-20 DIAGNOSIS — I509 Heart failure, unspecified: Secondary | ICD-10-CM

## 2015-06-20 DIAGNOSIS — J449 Chronic obstructive pulmonary disease, unspecified: Secondary | ICD-10-CM | POA: Diagnosis present

## 2015-06-20 DIAGNOSIS — D509 Iron deficiency anemia, unspecified: Secondary | ICD-10-CM | POA: Diagnosis present

## 2015-06-20 DIAGNOSIS — M4856XA Collapsed vertebra, not elsewhere classified, lumbar region, initial encounter for fracture: Secondary | ICD-10-CM | POA: Diagnosis present

## 2015-06-20 DIAGNOSIS — D649 Anemia, unspecified: Secondary | ICD-10-CM | POA: Diagnosis present

## 2015-06-20 DIAGNOSIS — Z7902 Long term (current) use of antithrombotics/antiplatelets: Secondary | ICD-10-CM

## 2015-06-20 DIAGNOSIS — G893 Neoplasm related pain (acute) (chronic): Secondary | ICD-10-CM | POA: Diagnosis present

## 2015-06-20 HISTORY — DX: Other specified disorders of adrenal gland: E27.8

## 2015-06-20 HISTORY — DX: Malignant neoplasm of unspecified part of unspecified bronchus or lung: C34.90

## 2015-06-20 HISTORY — DX: Non-ST elevation (NSTEMI) myocardial infarction: I21.4

## 2015-06-20 HISTORY — DX: Atherosclerotic heart disease of native coronary artery without angina pectoris: I25.10

## 2015-06-20 HISTORY — PX: PERIPHERAL VASCULAR CATHETERIZATION: SHX172C

## 2015-06-20 HISTORY — DX: Unstable angina: I20.0

## 2015-06-20 HISTORY — DX: Male erectile dysfunction, unspecified: N52.9

## 2015-06-20 HISTORY — DX: Cachexia: R64

## 2015-06-20 LAB — CBC WITH DIFFERENTIAL/PLATELET
Basophils Absolute: 0 10*3/uL (ref 0.0–0.1)
Basophils Relative: 0 %
EOS ABS: 0.5 10*3/uL (ref 0.0–0.7)
Eosinophils Relative: 2 %
HCT: 24.6 % — ABNORMAL LOW (ref 39.0–52.0)
Hemoglobin: 7.8 g/dL — ABNORMAL LOW (ref 13.0–17.0)
Lymphocytes Relative: 9 %
Lymphs Abs: 2.2 10*3/uL (ref 0.7–4.0)
MCH: 28.8 pg (ref 26.0–34.0)
MCHC: 31.7 g/dL (ref 30.0–36.0)
MCV: 90.8 fL (ref 78.0–100.0)
MONO ABS: 1.7 10*3/uL — AB (ref 0.1–1.0)
Monocytes Relative: 7 %
NEUTROS PCT: 82 %
Neutro Abs: 19.5 10*3/uL — ABNORMAL HIGH (ref 1.7–7.7)
PLATELETS: 309 10*3/uL (ref 150–400)
RBC: 2.71 MIL/uL — AB (ref 4.22–5.81)
RDW: 15.6 % — AB (ref 11.5–15.5)
WBC: 23.9 10*3/uL — AB (ref 4.0–10.5)

## 2015-06-20 LAB — APTT: aPTT: 35 seconds (ref 24–37)

## 2015-06-20 LAB — COMPREHENSIVE METABOLIC PANEL
ALBUMIN: 1.9 g/dL — AB (ref 3.5–5.0)
ALK PHOS: 82 U/L (ref 38–126)
ALT: 27 U/L (ref 17–63)
AST: 68 U/L — AB (ref 15–41)
Anion gap: 9 (ref 5–15)
BILIRUBIN TOTAL: 0.3 mg/dL (ref 0.3–1.2)
BUN: 23 mg/dL — AB (ref 6–20)
CALCIUM: 7.8 mg/dL — AB (ref 8.9–10.3)
CO2: 16 mmol/L — ABNORMAL LOW (ref 22–32)
CREATININE: 1.14 mg/dL (ref 0.61–1.24)
Chloride: 114 mmol/L — ABNORMAL HIGH (ref 101–111)
GFR calc Af Amer: >60 mL/min (ref >60)
GFR calc non Af Amer: >60 mL/min (ref >60)
GLUCOSE: 152 mg/dL — AB (ref 65–99)
Potassium: 3.9 mmol/L (ref 3.5–5.1)
Sodium: 139 mmol/L (ref 135–145)
TOTAL PROTEIN: 6 g/dL — AB (ref 6.5–8.1)

## 2015-06-20 LAB — CBC
HCT: 29.3 % — ABNORMAL LOW (ref 39.0–52.0)
HCT: 26.3 % — ABNORMAL LOW (ref 39.0–52.0)
Hemoglobin: 9 g/dL — ABNORMAL LOW (ref 13.0–17.0)
Hemoglobin: 8 g/dL — ABNORMAL LOW (ref 13.0–17.0)
MCH: 27.6 pg (ref 26.0–34.0)
MCH: 27.8 pg (ref 26.0–34.0)
MCHC: 30.7 g/dL (ref 30.0–36.0)
MCHC: 30.4 g/dL (ref 30.0–36.0)
MCV: 89.9 fL (ref 78.0–100.0)
MCV: 91.3 fL (ref 78.0–100.0)
PLATELETS: 345 10*3/uL (ref 150–400)
Platelets: 306 10*3/uL (ref 150–400)
RBC: 3.26 MIL/uL — ABNORMAL LOW (ref 4.22–5.81)
RBC: 2.88 MIL/uL — ABNORMAL LOW (ref 4.22–5.81)
RDW: 15.4 % (ref 11.5–15.5)
RDW: 15.6 % — AB (ref 11.5–15.5)
WBC: 21.1 10*3/uL — AB (ref 4.0–10.5)
WBC: 26 10*3/uL — ABNORMAL HIGH (ref 4.0–10.5)

## 2015-06-20 LAB — BASIC METABOLIC PANEL
Anion gap: 11 (ref 5–15)
BUN: 25 mg/dL — AB (ref 6–20)
CALCIUM: 8.6 mg/dL — AB (ref 8.9–10.3)
CHLORIDE: 110 mmol/L (ref 101–111)
CO2: 18 mmol/L — AB (ref 22–32)
CREATININE: 1.14 mg/dL (ref 0.61–1.24)
GFR calc Af Amer: >60 mL/min (ref >60)
GFR calc non Af Amer: >60 mL/min (ref >60)
Glucose, Bld: 124 mg/dL — ABNORMAL HIGH (ref 65–99)
Potassium: 4 mmol/L (ref 3.5–5.1)
SODIUM: 139 mmol/L (ref 135–145)

## 2015-06-20 LAB — POCT ACTIVATED CLOTTING TIME
ACTIVATED CLOTTING TIME: 234 s
ACTIVATED CLOTTING TIME: 229 s
ACTIVATED CLOTTING TIME: 178 s
ACTIVATED CLOTTING TIME: 183 s
ACTIVATED CLOTTING TIME: 167 s
Activated Clotting Time: 245 seconds
Activated Clotting Time: 198 seconds

## 2015-06-20 LAB — PROTIME-INR
INR: 1.32 (ref 0.00–1.49)
INR: 1.58 — ABNORMAL HIGH (ref 0.00–1.49)
PROTHROMBIN TIME: 16.5 s — AB (ref 11.6–15.2)
PROTHROMBIN TIME: 18.9 s — AB (ref 11.6–15.2)

## 2015-06-20 LAB — LACTIC ACID, PLASMA
Lactic Acid, Venous: 2.2 mmol/L (ref 0.5–2.0)
Lactic Acid, Venous: 2 mmol/L (ref 0.5–2.0)

## 2015-06-20 LAB — ABO/RH: ABO/RH(D): O POS

## 2015-06-20 LAB — PROCALCITONIN: Procalcitonin: 1.27 ng/mL

## 2015-06-20 SURGERY — ABDOMINAL AORTOGRAM W/LOWER EXTREMITY
Anesthesia: LOCAL

## 2015-06-20 MED ORDER — BISACODYL 5 MG PO TBEC
5.0000 mg | DELAYED_RELEASE_TABLET | Freq: Every day | ORAL | Status: DC | PRN
  Administered 2015-06-24: 5 mg via ORAL
  Filled 2015-06-20: qty 1

## 2015-06-20 MED ORDER — PIPERACILLIN-TAZOBACTAM 3.375 G IVPB 30 MIN
3.3750 g | Freq: Once | INTRAVENOUS | Status: AC
  Administered 2015-06-20: 3.375 g via INTRAVENOUS
  Filled 2015-06-20: qty 50

## 2015-06-20 MED ORDER — VANCOMYCIN HCL IN DEXTROSE 1-5 GM/200ML-% IV SOLN
1000.0000 mg | Freq: Two times a day (BID) | INTRAVENOUS | Status: DC
  Filled 2015-06-20: qty 200

## 2015-06-20 MED ORDER — SODIUM CHLORIDE 0.9 % IV SOLN: INTRAVENOUS | Status: DC

## 2015-06-20 MED ORDER — NICOTINE 14 MG/24HR TD PT24
14.0000 mg | MEDICATED_PATCH | Freq: Every day | TRANSDERMAL | Status: DC
  Administered 2015-06-20 – 2015-06-30 (×12): 14 mg via TRANSDERMAL
  Filled 2015-06-20 (×11): qty 1

## 2015-06-20 MED ORDER — LIDOCAINE HCL (PF) 1 % IJ SOLN
INTRAMUSCULAR | Status: DC | PRN
  Administered 2015-06-20: 30 mL via SUBCUTANEOUS

## 2015-06-20 MED ORDER — MORPHINE SULFATE (PF) 2 MG/ML IV SOLN
2.0000 mg | INTRAVENOUS | Status: DC | PRN
  Administered 2015-06-20 – 2015-06-21 (×6): 2 mg via INTRAVENOUS
  Filled 2015-06-20 (×6): qty 1

## 2015-06-20 MED ORDER — PHENYTOIN 50 MG PO CHEW
50.0000 mg | CHEWABLE_TABLET | Freq: Every day | ORAL | Status: DC
  Administered 2015-06-20 – 2015-06-29 (×9): 50 mg via ORAL
  Filled 2015-06-20 (×12): qty 1

## 2015-06-20 MED ORDER — FENTANYL CITRATE (PF) 100 MCG/2ML IJ SOLN
INTRAMUSCULAR | Status: AC
  Filled 2015-06-20: qty 2

## 2015-06-20 MED ORDER — SODIUM CHLORIDE 0.9 % WEIGHT BASED INFUSION: 3.0000 mL/kg/h | INTRAVENOUS | Status: DC

## 2015-06-20 MED ORDER — PHENYTOIN SODIUM EXTENDED 100 MG PO CAPS
300.0000 mg | ORAL_CAPSULE | Freq: Every day | ORAL | Status: DC
  Administered 2015-06-20 – 2015-06-29 (×10): 300 mg via ORAL
  Filled 2015-06-20 (×11): qty 3

## 2015-06-20 MED ORDER — FERROUS SULFATE 325 (65 FE) MG PO TABS
325.0000 mg | ORAL_TABLET | Freq: Two times a day (BID) | ORAL | Status: DC
  Administered 2015-06-20 – 2015-06-30 (×18): 325 mg via ORAL
  Filled 2015-06-20 (×20): qty 1

## 2015-06-20 MED ORDER — MOMETASONE FURO-FORMOTEROL FUM 200-5 MCG/ACT IN AERO
2.0000 | INHALATION_SPRAY | Freq: Two times a day (BID) | RESPIRATORY_TRACT | Status: DC
  Administered 2015-06-21 (×2): 2 via RESPIRATORY_TRACT
  Filled 2015-06-20: qty 8.8

## 2015-06-20 MED ORDER — LOSARTAN POTASSIUM 50 MG PO TABS
50.0000 mg | ORAL_TABLET | Freq: Every day | ORAL | Status: DC
  Administered 2015-06-21: 50 mg via ORAL
  Filled 2015-06-20: qty 1

## 2015-06-20 MED ORDER — HEPARIN SODIUM (PORCINE) 1000 UNIT/ML IJ SOLN
INTRAMUSCULAR | Status: AC
  Filled 2015-06-20: qty 1

## 2015-06-20 MED ORDER — CLOPIDOGREL BISULFATE 75 MG PO TABS
75.0000 mg | ORAL_TABLET | Freq: Every day | ORAL | Status: DC
  Filled 2015-06-20: qty 1

## 2015-06-20 MED ORDER — ALBUTEROL SULFATE (2.5 MG/3ML) 0.083% IN NEBU: 2.5000 mg | INHALATION_SOLUTION | Freq: Four times a day (QID) | RESPIRATORY_TRACT | Status: DC | PRN

## 2015-06-20 MED ORDER — ONDANSETRON HCL 4 MG/2ML IJ SOLN
4.0000 mg | Freq: Four times a day (QID) | INTRAMUSCULAR | Status: DC | PRN
  Administered 2015-06-20: 4 mg via INTRAVENOUS
  Filled 2015-06-20: qty 2

## 2015-06-20 MED ORDER — METOPROLOL TARTRATE 12.5 MG HALF TABLET
12.5000 mg | ORAL_TABLET | Freq: Two times a day (BID) | ORAL | Status: DC
  Administered 2015-06-20 – 2015-06-21 (×3): 12.5 mg via ORAL
  Filled 2015-06-20 (×3): qty 1

## 2015-06-20 MED ORDER — ASPIRIN EC 81 MG PO TBEC: 81.0000 mg | DELAYED_RELEASE_TABLET | Freq: Every day | ORAL | Status: DC

## 2015-06-20 MED ORDER — HYDRALAZINE HCL 20 MG/ML IJ SOLN
10.0000 mg | INTRAMUSCULAR | Status: DC | PRN
  Filled 2015-06-20: qty 1

## 2015-06-20 MED ORDER — SODIUM CHLORIDE 0.9% FLUSH: 3.0000 mL | INTRAVENOUS | Status: DC | PRN

## 2015-06-20 MED ORDER — METOPROLOL TARTRATE 12.5 MG HALF TABLET
12.5000 mg | ORAL_TABLET | Freq: Once | ORAL | Status: AC
  Administered 2015-06-20: 12.5 mg via ORAL
  Filled 2015-06-20: qty 1

## 2015-06-20 MED ORDER — HEPARIN (PORCINE) IN NACL 2-0.9 UNIT/ML-% IJ SOLN
INTRAMUSCULAR | Status: AC
  Filled 2015-06-20: qty 1000

## 2015-06-20 MED ORDER — FENTANYL CITRATE (PF) 100 MCG/2ML IJ SOLN
INTRAMUSCULAR | Status: DC | PRN
  Administered 2015-06-20 (×4): 25 ug via INTRAVENOUS

## 2015-06-20 MED ORDER — ACETAMINOPHEN 325 MG PO TABS
650.0000 mg | ORAL_TABLET | ORAL | Status: DC | PRN
  Administered 2015-06-21: 22:00:00 975 mg via ORAL
  Filled 2015-06-20: qty 2

## 2015-06-20 MED ORDER — IODIXANOL 320 MG/ML IV SOLN
INTRAVENOUS | Status: DC | PRN
  Administered 2015-06-20: 175 mL via INTRA_ARTERIAL

## 2015-06-20 MED ORDER — PANTOPRAZOLE SODIUM 40 MG PO TBEC
40.0000 mg | DELAYED_RELEASE_TABLET | Freq: Two times a day (BID) | ORAL | Status: DC
  Administered 2015-06-21 – 2015-06-30 (×19): 40 mg via ORAL
  Filled 2015-06-20 (×19): qty 1

## 2015-06-20 MED ORDER — SODIUM CHLORIDE 0.9 % IV BOLUS (SEPSIS)
250.0000 mL | INTRAVENOUS | Status: AC
  Administered 2015-06-20: 250 mL via INTRAVENOUS

## 2015-06-20 MED ORDER — SODIUM CHLORIDE 0.9 % IV BOLUS (SEPSIS)
250.0000 mL | Freq: Once | INTRAVENOUS | Status: AC
  Administered 2015-06-20: 13:00:00 250 mL via INTRAVENOUS

## 2015-06-20 MED ORDER — SODIUM CHLORIDE 0.9 % IV BOLUS (SEPSIS)
500.0000 mL | Freq: Once | INTRAVENOUS | Status: AC
  Administered 2015-06-20: 500 mL via INTRAVENOUS

## 2015-06-20 MED ORDER — ASPIRIN EC 325 MG PO TBEC
325.0000 mg | DELAYED_RELEASE_TABLET | Freq: Every day | ORAL | Status: DC
  Administered 2015-06-21 – 2015-06-24 (×4): 325 mg via ORAL
  Filled 2015-06-20 (×4): qty 1

## 2015-06-20 MED ORDER — NITROGLYCERIN 0.4 MG SL SUBL: 0.4000 mg | SUBLINGUAL_TABLET | SUBLINGUAL | Status: DC | PRN

## 2015-06-20 MED ORDER — SODIUM CHLORIDE 0.9 % WEIGHT BASED INFUSION: 1.0000 mL/kg/h | INTRAVENOUS | Status: DC

## 2015-06-20 MED ORDER — VANCOMYCIN HCL IN DEXTROSE 1-5 GM/200ML-% IV SOLN
1000.0000 mg | Freq: Two times a day (BID) | INTRAVENOUS | Status: DC
  Administered 2015-06-21 – 2015-06-24 (×7): 1000 mg via INTRAVENOUS
  Filled 2015-06-20 (×8): qty 200

## 2015-06-20 MED ORDER — SODIUM CHLORIDE 0.9 % IV SOLN
INTRAVENOUS | Status: AC
  Administered 2015-06-20: 13:00:00 via INTRAVENOUS

## 2015-06-20 MED ORDER — LIVING BETTER WITH HEART FAILURE BOOK: Freq: Once | Status: DC

## 2015-06-20 MED ORDER — HEPARIN SODIUM (PORCINE) 1000 UNIT/ML IJ SOLN
INTRAMUSCULAR | Status: DC | PRN
  Administered 2015-06-20: 5000 [IU] via INTRAVENOUS
  Administered 2015-06-20: 2000 [IU] via INTRAVENOUS

## 2015-06-20 MED ORDER — ASPIRIN 81 MG PO CHEW: 81.0000 mg | CHEWABLE_TABLET | ORAL | Status: DC

## 2015-06-20 MED ORDER — VANCOMYCIN HCL IN DEXTROSE 1-5 GM/200ML-% IV SOLN
1000.0000 mg | Freq: Once | INTRAVENOUS | Status: AC
  Administered 2015-06-20: 22:00:00 1000 mg via INTRAVENOUS
  Filled 2015-06-20: qty 200

## 2015-06-20 MED ORDER — ACETAMINOPHEN 500 MG PO TABS: 1000.0000 mg | ORAL_TABLET | Freq: Four times a day (QID) | ORAL | Status: DC | PRN

## 2015-06-20 MED ORDER — LEVETIRACETAM 500 MG PO TABS
1500.0000 mg | ORAL_TABLET | Freq: Two times a day (BID) | ORAL | Status: DC
  Administered 2015-06-20 – 2015-06-30 (×20): 1500 mg via ORAL
  Filled 2015-06-20: qty 3
  Filled 2015-06-20: qty 2
  Filled 2015-06-20: qty 3
  Filled 2015-06-20: qty 2
  Filled 2015-06-20: qty 3
  Filled 2015-06-20: qty 2
  Filled 2015-06-20: qty 3
  Filled 2015-06-20: qty 2
  Filled 2015-06-20 (×3): qty 3
  Filled 2015-06-20: qty 2
  Filled 2015-06-20 (×4): qty 3
  Filled 2015-06-20 (×2): qty 2
  Filled 2015-06-20: qty 3
  Filled 2015-06-20: qty 2
  Filled 2015-06-20 (×3): qty 3

## 2015-06-20 MED ORDER — PIPERACILLIN-TAZOBACTAM 3.375 G IVPB
3.3750 g | Freq: Three times a day (TID) | INTRAVENOUS | Status: DC
  Administered 2015-06-21 – 2015-06-24 (×10): 3.375 g via INTRAVENOUS
  Filled 2015-06-20 (×12): qty 50

## 2015-06-20 MED ORDER — ATORVASTATIN CALCIUM 80 MG PO TABS
80.0000 mg | ORAL_TABLET | Freq: Every day | ORAL | Status: DC
  Administered 2015-06-20 – 2015-06-29 (×9): 80 mg via ORAL
  Filled 2015-06-20 (×10): qty 1

## 2015-06-20 MED ORDER — BROMOCRIPTINE MESYLATE 2.5 MG PO TABS
2.5000 mg | ORAL_TABLET | Freq: Two times a day (BID) | ORAL | Status: DC
  Administered 2015-06-20 – 2015-06-30 (×20): 2.5 mg via ORAL
  Filled 2015-06-20 (×31): qty 1

## 2015-06-20 MED ORDER — LIDOCAINE HCL (PF) 1 % IJ SOLN
INTRAMUSCULAR | Status: AC
  Filled 2015-06-20: qty 30

## 2015-06-20 MED ORDER — CLOPIDOGREL BISULFATE 75 MG PO TABS: 75.0000 mg | ORAL_TABLET | Freq: Every day | ORAL | Status: DC

## 2015-06-20 MED ORDER — PIPERACILLIN-TAZOBACTAM 3.375 G IVPB
3.3750 g | Freq: Three times a day (TID) | INTRAVENOUS | Status: DC
  Filled 2015-06-20 (×2): qty 50

## 2015-06-20 MED ORDER — OXYCODONE HCL 5 MG PO TABS
5.0000 mg | ORAL_TABLET | Freq: Four times a day (QID) | ORAL | Status: DC | PRN
  Administered 2015-06-20 – 2015-06-26 (×18): 5 mg via ORAL
  Filled 2015-06-20 (×19): qty 1

## 2015-06-20 SURGICAL SUPPLY — 21 items
BALLN LUTONIX 5X150X130 (BALLOONS) ×2
BALLOON LUTONIX 5X150X130 (BALLOONS) IMPLANT
CATH ANGIO 5F PIGTAIL 65CM (CATHETERS) ×1 IMPLANT
CATH CROSS OVER TEMPO 5F (CATHETERS) ×1 IMPLANT
CATH HAWKONE LX EXTENDED TIP (CATHETERS) ×1 IMPLANT
COVER DOME SNAP 22 D (MISCELLANEOUS) ×1 IMPLANT
DEVICE SPIDERFX EMB PROT 6MM (WIRE) ×1 IMPLANT
GUIDEWIRE ANGLED .035X150CM (WIRE) ×1 IMPLANT
KIT ENCORE 26 ADVANTAGE (KITS) ×1 IMPLANT
KIT ESSENTIALS PG (KITS) ×1 IMPLANT
KIT PV (KITS) ×2 IMPLANT
SHEATH HIGHFLEX ANSEL 7FR 55CM (SHEATH) ×1 IMPLANT
SHEATH PINNACLE 5F 10CM (SHEATH) ×1 IMPLANT
SHEATH PINNACLE 7F 10CM (SHEATH) ×1 IMPLANT
SYRINGE MEDRAD AVANTA MACH 7 (SYRINGE) ×1 IMPLANT
TAPE RADIOPAQUE TURBO (MISCELLANEOUS) ×1 IMPLANT
TRANSDUCER W/STOPCOCK (MISCELLANEOUS) ×2 IMPLANT
TRAY PV CATH (CUSTOM PROCEDURE TRAY) ×2 IMPLANT
TUBING CIL FLEX 10 FLL-RA (TUBING) ×1 IMPLANT
WIRE HITORQ VERSACORE ST 145CM (WIRE) ×1 IMPLANT
WIRE SPARTACORE .014X300CM (WIRE) ×1 IMPLANT

## 2015-06-20 NOTE — Progress Notes (Signed)
Patient voiced no further complaints of back pain. Patient's HR=125, Nell Range PA notified of HR remaining in 120's post bolus infusions. Nell Range PA states to give patient 12.'5mg'$  Lopressor and obtain EKG. EKG obtained and patient vomits. Zofran given and Lopressor given per order. Nell Range PA on floor and notified of vomiting episode. Chest x-ray ordered and U/A ordered. No further complaints of back pain voiced. Patient did not eat his lunch, PA informed. Per patient's wife, patient "Does not eat at home." Call bell is in reach and bed is in lowest position. Will update RN coming on at 1500.

## 2015-06-20 NOTE — Patient Outreach (Signed)
[facility]) Care Management  06/20/2015  Rustin Erhart 07/21/1945 [id number]  CSW received notification that patient was admitted into the hospital on 06/20/15. CSW will notify involved [facility] clinical team. CSW will contact patient and family once patient has been discharged.  Eula Fried, BSW, MSW, [email or web address] Phone: [phone] Fax: [phone]

## 2015-06-20 NOTE — Progress Notes (Signed)
CRITICAL VALUE ALERT  Critical value received: Lactic Acid= 2.2  Date of notification:  06/20/15  Time of notification:  20:49  Critical value read back:Yes.    Nurse who received alert:  Vita Erm RN  MD notified (1st page):  Dr. Darene Lamer. Opyd  Time of first page:  21:00  MD notified (2nd page):  Time of second page:  Responding MD:  Dr. Myna Hidalgo  Time MD responded:  21:05

## 2015-06-20 NOTE — Progress Notes (Signed)
Site area: left groin  Site Prior to Removal:  Level 0  Pressure Applied For 20 MINUTES    Minutes Beginning at 1330  Manual:   Yes.    Patient Status During Pull:  Patient remains A&O by four, unlabored breathing noted. Complains of back pain other than that tolerated removal well.   Post Pull Groin Site:  Level 0  Post Pull Instructions Given:  Yes.    Post Pull Pulses Present:  Yes.    Dressing Applied:  Yes.    Comments:  Pressure dressing applied, C/D/I. Tolerated well. No bleeding or hematoma noted. Patient tolerated well. No s/s of distress noted or complaints voiced at this time. Call bell is in reach. Patient medicated for back pain post sheath removal.

## 2015-06-20 NOTE — H&P (Signed)
Date: 06/20/15  ID: [name], DOB Jul 31, 1945, MRN [id number]  PCP: [name], MD Primary Cardiologist: Gwenlyn Found  Chief Complaint  Patient presents with  . f/up cath & stent    complains of chest pressure; short of breath when going up stairs (not new); a little lightheadedness; feet swelling; lower back pain since procedure    History of Present Illness: Cristian Moss is a 70 y.o. male with a history of hypertension, tobacco abuse, and stage  II-III chronic kidney disease. 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 This resulted in a seizure disorder for which, he has been followed by neurology and treated with seizure medications. He says he has probably 7+ seizures per year. Historically, his seizures manifest as blank staring and absence, usually lasting only a few minutes, and resolving spontaneously. His hospitalized for liver seventh 2 06/06/2015 community acquired pneumonia, Seizure. He had elevated troponin developed chest pain. He underwent a left heart catheterization revealing a proximal RCA that was 80% stenosed in a first marginal of 65%. The proximal RCA was treated with a synergy drug-eluting stent to start aspirin and Plavix. He was also put on Lipitor 80 mg, Lopressor 2012.5 twice daily takes Cozaar 54 hypertension. His echocardiogram revealed an ejection fraction of 45-50%. there is also hypokinesis of the apical anterior lateral myocardium. He also has a history of chronic dyspnea on exertion, dating back about 7 years. He says that at baseline, he can walk about 50 yards on flat surface prior to having to stop and rest secondary to dyspnea. He has no history of exertional chest discomfort.  The patient presents for posthospital follow-up according to his wife he does not walk at all and just sits in a chair watching TV. Continues to be dyspneic but  denies chest pain, apnea. He says he gets pain in his feet when he starts to walk. He also feels generally fatigued, but again he does not walk anywhere. He is taking all his medications.   The patient currently denies nausea, vomiting, fever, dizziness, PND, cough, congestion, abdominal pain, hematochezia, lower extremity edema,     Wt Readings from Last 3 Encounters:  05/18/15 165 lb (74.844 kg)  05/09/15 165 lb 9.1 oz (75.1 kg)  06/19/14 177 lb (80.287 kg)     Past Medical History  Diagnosis Date  . Essential hypertension   . Seizures ([place])   . COPD (chronic obstructive pulmonary disease) ([place])   . Thrombocytopenia ([place])   . 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 BPH (benign prostatic hypertrophy)   . Erectile dysfunction   . Empty sella syndrome ([place])     History of  . CKD (chronic kidney disease), stage III   . Hyponatremia     history of  . Tobacco abuse     Current Outpatient Prescriptions  Medication Sig Dispense Refill  . albuterol (PROVENTIL HFA;VENTOLIN HFA) 108 (90 Base) MCG/ACT inhaler Inhale 2 puffs into the lungs every 6 (six) hours as needed for wheezing or shortness of breath. 1 Inhaler 0  . amoxicillin-clavulanate (AUGMENTIN) 875-125 MG tablet Take 1 tablet by mouth every 12 (twelve) hours. 48 tablet 0  . aspirin EC 81 MG EC tablet Take 1 tablet (81 mg total) by mouth daily.    Marland Kitchen atorvastatin (LIPITOR) 80 MG tablet Take 1 tablet (80 mg total) by mouth daily at 6 PM. 30 tablet 0  . bromocriptine (PARLODEL)  2.5 MG tablet Take 2.5 mg by mouth 2 (two) times daily.     . budesonide-formoterol (SYMBICORT) 160-4.5 MCG/ACT inhaler Inhale 2 puffs into the lungs 2 (two) times daily. 1 Inhaler 0  . clopidogrel (PLAVIX) 75 MG tablet Take 1 tablet (75 mg total) by mouth daily with breakfast. 30 tablet 0  . ferrous sulfate 325 (65 FE) MG tablet Take 1 tablet (325 mg total) by mouth 2  (two) times daily with a meal. 60 tablet 0  . levETIRAcetam (KEPPRA) 750 MG tablet Take 1,500 mg by mouth every 12 (twelve) hours.    Marland Kitchen losartan (COZAAR) 50 MG tablet Take 50 mg by mouth daily.  0  . metoprolol tartrate (LOPRESSOR) 25 MG tablet Take 0.5 tablets (12.5 mg total) by mouth 2 (two) times daily. 60 tablet 0  . nicotine (NICODERM CQ) 14 mg/24hr patch Place 1 patch (14 mg total) onto the skin daily. 28 patch 0  . nitroGLYCERIN (NITROSTAT) 0.4 MG SL tablet Place 1 tablet (0.4 mg total) under the tongue every 5 (five) minutes as needed for chest pain. FOR 3 DOSES ONLY 30 tablet 0  . oxyCODONE (OXY IR/ROXICODONE) 5 MG immediate release tablet Take 1 tablet (5 mg total) by mouth every 6 (six) hours as needed for moderate pain. 30 tablet 0  . pantoprazole (PROTONIX) 40 MG tablet Take 1 tablet (40 mg total) by mouth 2 (two) times daily. 60 tablet 0  . phenytoin (DILANTIN) 100 MG ER capsule Take 3 capsules (300 mg total) by mouth at bedtime. 90 capsule 0  . phenytoin (DILANTIN) 50 MG tablet Chew 1 tablet (50 mg total) by mouth at bedtime. Along with 300 mg-for a total of 350 mg daily 30 tablet 0  . polyethylene glycol (MIRALAX) packet Take 17 g by mouth daily. 14 each 0   No current facility-administered medications for this visit.    Allergies: No Known Allergies  Social History: The patient  reports that he quit smoking about 2 weeks ago. His smoking use included Cigarettes. He has a 23.5 pack-year smoking history. He has never used smokeless tobacco. He reports that he does not drink alcohol or use illicit drugs.   Family history:  Family History  Problem Relation Age of Onset  . Asthma Daughter   . Heart disease Sister     ? developed in her 102's  . Other Mother     died in her 49's of unknown causes  . Other Father     pt unaware of father's pmh.    ROS: Please see the history of present  illness. All other systems reviewed and negative.   PHYSICAL EXAM: VS: BP 106/74 mmHg  Pulse 99  Ht '5\' 10"'$  (1.778 m)  Wt 165 lb (74.844 kg)  BMI 23.68 kg/m2 Well nourished, well developed, in no acute distress  HEENT: Pupils are equal round react to light accommodation extraocular movements are intact. Conjunctiva are pale Neck: no JVD No cervical lymphadenopathy. Cardiac: Regular rate and rhythm without murmurs rubs or gallops.  Lungs: clear to auscultation bilaterally, no wheezing, rhonchi or rales  Abd: soft, nontender, positive bowel sounds all quadrants, no hepatosplenomegaly  Ext: no lower extremity edema. 1+ radial and 0 pedis pulses. Skin: warm and dry  Neuro: Grossly normal  EKG: Normal sinus rhythm rate 99 bpm. Lateral T-wave inversion   ASSESSMENT AND PLAN:  Problem List Items Addressed This Visit    Tobacco abuse   Fatigue   Relevant Orders   CBC  Essential hypertension   Coronary artery disease due to lipid rich plaque   CKD (chronic kidney disease), stage III    Other Visit Diagnoses    Decreased pulse - Primary    Relevant Orders    VAS [place] LOWER EXTREMITY ARTERIAL DUPLEX       Mr. Forse general looks very fatigued. His wife states he does not exercise at all. He sits in his chair watching TV all day. I suspect some of his fatigue is related to deconditioning. he does report some really dark stools on occasion. His conjunctiva very pale. We will check a CBC. He is taking all medications as directed. we will refill several of them is was discharged without refills. He has not smoked since his hospitalization. Blood pressure is controlled. He has extremely poor distal and radial pulses. I have reviewed the lower extremity Doppler studies which demonstrate severe peripheral arterial disease.  Given the patient's symptoms, Doppler abnormalities and pursuant to heart discussion the patient presents today for angiography  and potential intervention.  Lorretta Harp, M.D., [place], [facility], Laverta [place] [address]. [place], [place]  [id number]  [phone] 06/20/2015 7:47 AM

## 2015-06-20 NOTE — Consult Note (Addendum)
[facility] Medical Consultation  Humphrey Guerreiro GBT:[id number] DOB: 01/11/1946 DOA: 06/20/2015  Referring physician: Nell Range, PA PCP: [name], MD  Admitting: Dr. Gwenlyn Found (cardiology)   Reason for consult:  Hypotension, tachycardia, leukocytosis   HPI: Cristian Moss is a 70 y.o. male with PMH of seizure disorder, emphysema with ongoing tobacco abuse, CAD status post DES to RCA 6 weeks ago, peripheral arterial disease status post intervention to right SFA earlier today, and cavitating right upper lobe lung mass with mediastinal adenopathy concerning for malignancy who presented to the hospital this morning for abdominal aortogram with bifemoral runoff and angioplasty of the right SFA. Patient apparently tolerated the procedure well, but developed hypotension, tachycardia, respiratory distress, and vomiting shortly after. The primary team was contacted and the patient was bolused with 250 cc of normal saline 2. There was concern for infection and chest x-ray and urinalysis were ordered. Chest x-ray re-demonstrates a known cavitating mass in the right upper lobe that is being followed by pulmonology and highly concerning for bronchogenic carcinoma. Low blood pressures and tachycardia persisted after the fluid boluses and a 12.5 mg IVP Lopressor was given. Hospitalists were asked to consult on the case.   Patient is evaluated on the cath recovery unit where he is found to be afebrile, saturating 98% on 2 L/m supplemental oxygen, tachycardic in the 110s, and with blood pressure in the 105/60 range. He denies fevers, chills, chest pain, palpitations, or dyspnea at this time. He denies abdominal pain, nausea, or dysuria. Patient's primary complaint at this time is severe low back pain which he reports as constant for the past month following a ground-level mechanical fall at home. Radiographs of lumbar spine obtained by PCP and negative. Of note, on a chest CT obtained last month, there  was incidental notation of a L1 lesion thought to possibly represent a fracture.   On behalf of [facility], we certainly appreciate the opportunity to be involved in the care of this fine gentleman.   Where does patient live?   At home     Can patient participate in ADLs?  Yes       Review of Systems:  Full ROS is obtained and, aside from that mentioned in the HPI, positive only for low back pain and fatigue.     Allergy: No Known Allergies  Past Medical History  Diagnosis Date  . Essential hypertension   . Seizures ([place])   . COPD (chronic obstructive pulmonary disease) ([place])   . Thrombocytopenia ([place])   . 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 BPH (benign prostatic hypertrophy)   . Erectile dysfunction   . Empty sella syndrome ([place])     History of  . CKD (chronic kidney disease), stage III   . Hyponatremia     history of  . Tobacco abuse     Past Surgical History  Procedure Laterality Date  . 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 Cardiac catheterization N/A 05/06/2015    Procedure: Left Heart Cath and Coronary Angiography;  Surgeon: Leonie Man, MD;  Location: [facility];  Service: Cardiovascular;  Laterality: N/A;  . Cardiac catheterization N/A 05/06/2015    Procedure: Coronary Stent Intervention;  Surgeon: Leonie Man, MD;  Location: [facility];  Service: Cardiovascular;  Laterality: N/A;  . Peripheral vascular catheterization N/A 06/20/2015    Procedure: Abdominal Aortogram w/Lower Extremity;  Surgeon: Lorretta Harp, MD;  Location: [facility];  Service: Cardiovascular;  Laterality:  N/A;    Social History:  reports that he quit smoking about 6 weeks ago. His smoking use included Cigarettes. He has a 23.5 pack-year smoking history. He has never used smokeless tobacco. He reports that he does not drink alcohol or use illicit drugs.  Family History:  Family History  Problem Relation Age of Onset  . Asthma Daughter   . Heart disease Sister      ? developed in her 27's  . Other Mother     died in her 29's of unknown causes  . Other Father     pt unaware of father's pmh.     Prior to Admission medications   Medication Sig Start Date End Date Taking? Authorizing Provider  acetaminophen (TYLENOL) 500 MG tablet Take 1,000 mg by mouth every 6 (six) hours as needed for moderate pain.   Yes Historical Provider, MD  albuterol (PROVENTIL HFA;VENTOLIN HFA) 108 (90 Base) MCG/ACT inhaler Inhale 2 puffs into the lungs every 6 (six) hours as needed for wheezing or shortness of breath. 05/09/15  Yes Shanker Kristeen Mans, MD  aspirin EC 81 MG EC tablet Take 1 tablet (81 mg total) by mouth daily. 05/09/15  Yes Shanker Kristeen Mans, MD  atorvastatin (LIPITOR) 80 MG tablet Take 1 tablet (80 mg total) by mouth daily at 6 PM. 05/18/15  Yes Brett Canales, PA-C  bisacodyl (DULCOLAX) 5 MG EC tablet Take 5 mg by mouth daily as needed for moderate constipation.   Yes Historical Provider, MD  bromocriptine (PARLODEL) 2.5 MG tablet Take 2.5 mg by mouth 2 (two) times daily.    Yes Historical Provider, MD  clopidogrel (PLAVIX) 75 MG tablet Take 1 tablet (75 mg total) by mouth daily with breakfast. 05/18/15  Yes Brett Canales, PA-C  ferrous sulfate 325 (65 FE) MG tablet Take 1 tablet (325 mg total) by mouth 2 (two) times daily with a meal. 05/09/15  Yes Shanker Kristeen Mans, MD  levETIRAcetam (KEPPRA) 750 MG tablet Take 2 tablets (1,500 mg total) by mouth every 12 (twelve) hours. 05/20/15  Yes Adam Telford Nab, DO  losartan (COZAAR) 50 MG tablet Take 1 tablet (50 mg total) by mouth daily. 05/18/15  Yes Brett Canales, PA-C  metoprolol tartrate (LOPRESSOR) 25 MG tablet Take 0.5 tablets (12.5 mg total) by mouth 2 (two) times daily. 05/18/15  Yes Brett Canales, PA-C  nitroGLYCERIN (NITROSTAT) 0.4 MG SL tablet Place 1 tablet (0.4 mg total) under the tongue every 5 (five) minutes as needed for chest pain. FOR 3 DOSES ONLY 05/09/15  Yes Shanker Kristeen Mans, MD  oxyCODONE (OXY IR/ROXICODONE) 5 MG  immediate release tablet Take 1 tablet (5 mg total) by mouth every 6 (six) hours as needed for moderate pain. Patient taking differently: Take 5 mg by mouth every 6 (six) hours as needed for moderate pain.  05/09/15  Yes Shanker Kristeen Mans, MD  pantoprazole (PROTONIX) 40 MG tablet Take 1 tablet (40 mg total) by mouth 2 (two) times daily. 05/09/15  Yes Shanker Kristeen Mans, MD  phenytoin (DILANTIN) 100 MG ER capsule Take 3 capsules (300 mg total) by mouth at bedtime. 05/09/15  Yes Shanker Kristeen Mans, MD  phenytoin (DILANTIN) 50 MG tablet Chew 1 tablet (50 mg total) by mouth at bedtime. Along with 300 mg-for a total of 350 mg daily 06/01/15  Yes Adam Telford Nab, DO  budesonide-formoterol (SYMBICORT) 160-4.5 MCG/ACT inhaler Inhale 2 puffs into the lungs 2 (two) times daily. 05/09/15   Shanker Kristeen Mans, MD  nicotine (NICODERM CQ) 14 mg/24hr patch Place 1 patch (14 mg total) onto the skin daily. 05/09/15   Shanker Kristeen Mans, MD    Physical Exam: Filed Vitals:   06/20/15 1500 06/20/15 1600 06/20/15 1615 06/20/15 1700  BP: 105/52 85/53 107/59 95/52  Pulse:  104  110  Temp:  98 F (36.7 C)    TempSrc:  Oral    Resp: '21 25 21 24  '$ Height:      Weight:      SpO2:    95%   General: Not in acute distress HEENT:       Eyes: PERRL, EOMI, no scleral icterus or conjunctival pallor.       ENT: No discharge from the ears or nose, no pharyngeal ulcers, oral mucosa moist.        Neck: No JVD, no bruit, no appreciable mass Heme: No cervical adenopathy, no pallor Cardiac: Rate ~110 and regular. No gallops or rubs. Pulm: Good air movement bilaterally. Coarse ronchi throughout right lung fields, sonorous upper airway sounds. Abd: Soft, nondistended, mild suprapubic tenderness, no rebound pain or gaurding, BS present. Ext: No LE edema bilaterally. Trace DP/PT pulse bilaterally. Trace right radial pulse.  Musculoskeletal: No gross deformity, no red, hot, swollen joints.   Skin: No rashes or wounds on exposed surfaces   Neuro: Alert, oriented X3, cranial nerves II-XII grossly intact. No focal findings Psych: Patient is not overtly psychotic, appropriate mood and affect.  Labs on Admission:  Basic Metabolic Panel:  Recent Labs Lab 06/20/15 0649  NA 139  K 4.0  CL 110  CO2 18*  GLUCOSE 124*  BUN 25*  CREATININE 1.14  CALCIUM 8.6*   Liver Function Tests: No results for input(s): AST, ALT, ALKPHOS, BILITOT, PROT, ALBUMIN in the last 168 hours. No results for input(s): LIPASE, AMYLASE in the last 168 hours. No results for input(s): AMMONIA in the last 168 hours. CBC:  Recent Labs Lab 06/20/15 0649 06/20/15 1828  WBC 21.1* 26.0*  HGB 9.0* 8.0*  HCT 29.3* 26.3*  MCV 89.9 91.3  PLT 345 306   Cardiac Enzymes: No results for input(s): CKTOTAL, CKMB, CKMBINDEX, TROPONINI in the last 168 hours.  BNP (last 3 results)  Recent Labs  05/05/15 0522  BNP 226.8*    ProBNP (last 3 results) No results for input(s): PROBNP in the last 8760 hours.  CBG: No results for input(s): GLUCAP in the last 168 hours.  Radiological Exams on Admission: Dg Chest Port 1 View  06/20/2015  CLINICAL DATA:  Tachycardia, chills, elevated white blood cells EXAM: PORTABLE CHEST 1 VIEW COMPARISON:  06/08/2015 FINDINGS: There is right upper lobe consolidation. There is no pleural effusion or pneumothorax. The heart and mediastinal contours are unremarkable. The osseous structures are unremarkable. IMPRESSION: Right upper lobe consolidation most concerning for pneumonia. Given the masslike area in the right upper lobe on previous CT of the chest dated 06/09/2015 this is concerning for possible intrapulmonary abscess. Electronically Signed   By: Kathreen Devoid   On: 06/20/2015 16:31    EKG: Independently reviewed.  Abnormal findings:  Sinus tachycardia (rate 112), LAD   Assessment/Plan  1. SIRS   - Pt has tachycardia and leukocytosis, likely secondary to dehydration and malignancy, respectively; sepsis is certainly  possible as well   - Most likely sources of infxn would include PNA (difficult to distinguish interval growth of likely malignant mass vs. an associated infection), and UTI given mild suprapubic tenderness  - Leukocytosis of 21,000 noted; wbc has  been elevated for at least 6 wks and, again, difficult to determine if this is indicative of infectious process, progressive malignancy, or both  - UA still pending, there has been difficulty procuring sample, may be easier after IVF  - Blood and urine cultures will be obtained  - Check lactate and procalcitonin (both of which could be elevated in setting of malignancy as well)  - Will hold off on empiric abx for now and watch out for the pending labs and forthcoming culture data   2. Dehydration   - Supported by elevated BUN:SCr ratio and physical findings   - 250 cc NS bolus given x2 this evening; additional 500 cc NS bolus now - Increase IVF from 75 to 100 cc/hr overnight  - Repeat chem panel in am    3. Low back pain - This is the patient's primary complaint  - He reports a fall at home; radiographs obtained by PCP negative for acute fx  - Chest CT from last month with incidental notation of possible L1 fx  - Pain-control per primary team  - Check UA for infection, hematuria, crystals    4. ASCVD  - CAD with DES to RCA on 05/06/15  - PAD with atherectomy and angioplasty of right SFA 06/20/15  - Maintained on DAPT with ASA, Plavix  - No angina or equivalent complaints at this time; no acute findings on EKG this evening  - Management per primary team    5. 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 Rolla Etienne of pulmonology/CC with tentative plans for inpt IR biopsy  - Pt completed a 4-wk course of Augmentin as there was concern that this may have been an  infectious process  - CXR seems to demonstrate interval growth (or possibly an associated PNA)   6. 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 Emphysema, tobacco abuse  - Counseled toward tobacco cessation; pt reports he has quit since last admission and he was commended for this  - Reinforce continued abstinence; his family has been recruited to help   8. Hypertension  - Currently running low, requiring fluid resuscitation  - Monitor; IV hydralazine pushes available prn   9. Anemia  - Hgb 9.0 on arrival this am; consistent with his apparent baseline - No evidence of active blood loss at this time  - CBC is ordered for the am    DVT ppx:  Defer to primary team  Code Status: Full code Family Communication:  Yes, patient's wife and daughter at bed side Disposition Plan: Admit to inpatient   Date of Service 06/20/2015    Vianne Bulls, MD [facility] Pager [phone]  If 7PM-7AM, please contact night-coverage [email or web address] Password [facility] 06/20/2015, 7:12 PM

## 2015-06-20 NOTE — Progress Notes (Signed)
    Patient still tachy and now vomiting. He has chills but no fever. Does have an elevated white count to 21.1K. Continue supportive care. BP better after 500 cc NS bolus. Will order a CXR as he has had a cough and UA to try to find source of infection. ECG shows sinus tachy.   Angelena Form PA-C  [facility]

## 2015-06-20 NOTE — Progress Notes (Signed)
Pharmacy Antibiotic Note  Cristian Moss is a 70 y.o. male admitted on 06/20/2015 s/p aortogram.  He developed hypotension and chills and will be treated for  pneumonia.  Pharmacy has been consulted for vancomycin andZosyn dosing. He is afebrile but has increased WBC which may be associated with malignancy.  He will have biopsy this week.  Plan: Vancomycin 1gm q12hr Zosyn 3.375GM q8h EI  Height: '5\' 10"'$  (177.8 cm) Weight: 165 lb (74.844 kg) IBW/kg (Calculated) : 73  Temp (24hrs), Avg:98.1 F (36.7 C), Min:98 F (36.7 C), Max:98.2 F (36.8 C)   Recent Labs Lab 06/20/15 0649 06/20/15 1828 06/20/15 1926  WBC 21.1* 26.0* 23.9*  CREATININE 1.14  --  1.14  LATICACIDVEN  --   --  2.2*    Estimated Creatinine Clearance: 61.4 mL/min (by C-G formula based on Cr of 1.14).    No Known Allergies  Antimicrobials this admission:   Dose adjustments this admission:   Microbiology results:   Bonnita Nasuti Pharm.D. CPP, BCPS Clinical Pharmacist [phone] 06/20/2015 9:44 PM

## 2015-06-20 NOTE — Progress Notes (Signed)
RN called due to pt's BP 79-39 systolic.  Will give him 250 cc bolus on NS.  PV PCI today.

## 2015-06-20 NOTE — Patient Outreach (Signed)
[facility]) Care Management  06/20/2015  Cristian Moss January 06, 1946 [id number]  Member noted to be in hospital for procedural angiography with possible intervention.  Plan: Follow up once discharged.  [name], RN, MSN, [place] Coordinator Cell: [phone]

## 2015-06-20 NOTE — Progress Notes (Signed)
     I was called several times about this patient due to hypotension and tachycardia. We gave him several normal saline boluses which did help his blood pressure. We did an EKG which revealed sinus tachycardia. When I went up to check on the patient he complained of cough, chills and nausea. He did have an episode of emesis. I noticed that he had a white count of 21.1 and ordered a chest x-ray and UA.   Chest x-ray revealed a right upper lobe consolidation concerning for pneumonia as well as intrapulmonary abscess. Upon further review, he is followed by Dr. Vaughan Browner with pulmonology for a cavitating mass in the right upper lung lobe that is concerning for possible malignancy. He does need a biopsy for definitive diagnosis however this has had to be postponed due to the need for dual antiplatelet therapy with recent stenting.  Dr. Gwenlyn Found called Dr. Vaughan Browner and it was decided to perform this while inpatient by interventional radiology. We will stop the Plavix therapy today and plan to start IV Cangrelor (short acting antiplatelet) on Wednesday. He then can be restarted on Plavix therapy after his biopsy.  Will get a CBC tonight to rule out bleeding from his groin site. No hematoma or pain currently.    Angelena Form PA-C  [facility]

## 2015-06-20 NOTE — Progress Notes (Signed)
Patient arrived to floor via bed at approx 1010. Left groin sheath in place, level 0. No hematoma noted. No bleeding noted. Patient is anxious and states he is cold. Warm blankets applied. Patient complains of back ache. Medicated for pain per order. 2 liters nasal cannula applied to increase oxygen saturation to 94%. Patient noted to be sob with exertion. Incontinent urine and stool noted, patient cleaned and dry clean linen applied. ACT=198 at 1100. Will re-check at 1130. Family at bedside. No other s/s of distress noted or complaints voiced at this time. Call bell is in reach. Bed is in lowest position.

## 2015-06-20 NOTE — Progress Notes (Signed)
      Still hypotensive after 250 mL bolus. SBP 88. Also sinus tachy. Will give another 261m bolus.   KAngelena FormPA-C  [facility]

## 2015-06-20 NOTE — Progress Notes (Addendum)
Lactic acid elevated, likely secondary to dehydration (which is being addressed) vs infection. There is a neutrophilic predominance and left-shift on the differential, favoring infection. Cultures are incubating. Will initiate broad-spectrum abx empirically at this time, tailor according to forthcoming culture data.   Hgb is 7.8; type and screen has been requested.

## 2015-06-20 NOTE — Progress Notes (Signed)
At approx 1125, patient's sbp noted to be in 80-90's. Cecilie Kicks NP notified and states to given patient 250cc ns fluid bolus. Bolus infusing,patient's sbp in 100's at 1145. Nell Range PA notified at that time and states to continue to monitor. At approx Lane notified that BP=88/62 HR=125, she states to infuse another 250cc ns bolus, bolus infusing. At 1300 post bolus infusing, BP= 107/57 HR=124, prepare to remove sheath. No other s/s of distress noted or complaints voiced. Patient remains A&O by four.

## 2015-06-21 ENCOUNTER — Ambulatory Visit (HOSPITAL_COMMUNITY): Payer: PPO

## 2015-06-21 DIAGNOSIS — J449 Chronic obstructive pulmonary disease, unspecified: Secondary | ICD-10-CM | POA: Diagnosis not present

## 2015-06-21 DIAGNOSIS — J984 Other disorders of lung: Secondary | ICD-10-CM

## 2015-06-21 DIAGNOSIS — M545 Low back pain: Secondary | ICD-10-CM

## 2015-06-21 DIAGNOSIS — I2510 Atherosclerotic heart disease of native coronary artery without angina pectoris: Secondary | ICD-10-CM

## 2015-06-21 DIAGNOSIS — I739 Peripheral vascular disease, unspecified: Principal | ICD-10-CM

## 2015-06-21 DIAGNOSIS — I10 Essential (primary) hypertension: Secondary | ICD-10-CM

## 2015-06-21 LAB — URINALYSIS, ROUTINE W REFLEX MICROSCOPIC
Bilirubin Urine: NEGATIVE
Glucose, UA: NEGATIVE mg/dL
KETONES UR: NEGATIVE mg/dL
LEUKOCYTES UA: NEGATIVE
NITRITE: NEGATIVE
PH: 5 (ref 5.0–8.0)
Protein, ur: NEGATIVE mg/dL
SPECIFIC GRAVITY, URINE: 1.015 (ref 1.005–1.030)

## 2015-06-21 LAB — CBC
HCT: 24.3 % — ABNORMAL LOW (ref 39.0–52.0)
HEMOGLOBIN: 7.3 g/dL — AB (ref 13.0–17.0)
MCH: 27.4 pg (ref 26.0–34.0)
MCHC: 30 g/dL (ref 30.0–36.0)
MCV: 91.4 fL (ref 78.0–100.0)
Platelets: 276 10*3/uL (ref 150–400)
RBC: 2.66 MIL/uL — ABNORMAL LOW (ref 4.22–5.81)
RDW: 15.7 % — ABNORMAL HIGH (ref 11.5–15.5)
WBC: 19.5 10*3/uL — AB (ref 4.0–10.5)

## 2015-06-21 LAB — STREP PNEUMONIAE URINARY ANTIGEN: Strep Pneumo Urinary Antigen: NEGATIVE

## 2015-06-21 LAB — BASIC METABOLIC PANEL
ANION GAP: 7 (ref 5–15)
BUN: 20 mg/dL (ref 6–20)
CALCIUM: 7.5 mg/dL — AB (ref 8.9–10.3)
CO2: 17 mmol/L — ABNORMAL LOW (ref 22–32)
Chloride: 115 mmol/L — ABNORMAL HIGH (ref 101–111)
Creatinine, Ser: 1.02 mg/dL (ref 0.61–1.24)
GLUCOSE: 110 mg/dL — AB (ref 65–99)
Potassium: 4 mmol/L (ref 3.5–5.1)
SODIUM: 139 mmol/L (ref 135–145)

## 2015-06-21 LAB — URINE MICROSCOPIC-ADD ON

## 2015-06-21 LAB — IRON AND TIBC
Iron: 22 ug/dL — ABNORMAL LOW (ref 45–182)
SATURATION RATIOS: 15 % — AB (ref 17.9–39.5)
TIBC: 148 ug/dL — AB (ref 250–450)
UIBC: 126 ug/dL

## 2015-06-21 LAB — RETICULOCYTES
RBC.: 2.77 MIL/uL — ABNORMAL LOW (ref 4.22–5.81)
Retic Count, Absolute: 63.7 10*3/uL (ref 19.0–186.0)
Retic Ct Pct: 2.3 % (ref 0.4–3.1)

## 2015-06-21 LAB — FOLATE: FOLATE: 5.2 ng/mL — AB (ref >5.9)

## 2015-06-21 LAB — FERRITIN: Ferritin: 448 ng/mL — ABNORMAL HIGH (ref 24–336)

## 2015-06-21 LAB — PREPARE RBC (CROSSMATCH)

## 2015-06-21 LAB — VITAMIN B12: VITAMIN B 12: 737 pg/mL (ref 180–914)

## 2015-06-21 MED ORDER — SODIUM CHLORIDE 0.9 % IV SOLN
INTRAVENOUS | Status: DC
  Administered 2015-06-22 (×3): via INTRAVENOUS

## 2015-06-21 MED ORDER — ACETAMINOPHEN 325 MG PO TABS
975.0000 mg | ORAL_TABLET | Freq: Four times a day (QID) | ORAL | Status: DC | PRN
  Administered 2015-06-22: 975 mg via ORAL
  Filled 2015-06-21 (×2): qty 3

## 2015-06-21 MED ORDER — SODIUM CHLORIDE 0.9 % IV SOLN
Freq: Once | INTRAVENOUS | Status: AC
  Administered 2015-06-21: 21:00:00 via INTRAVENOUS

## 2015-06-21 MED ORDER — CETYLPYRIDINIUM CHLORIDE 0.05 % MT LIQD
7.0000 mL | Freq: Two times a day (BID) | OROMUCOSAL | Status: DC
  Administered 2015-06-21 – 2015-06-30 (×16): 7 mL via OROMUCOSAL

## 2015-06-21 MED FILL — Heparin Sodium (Porcine) 2 Unit/ML in Sodium Chloride 0.9%: INTRAMUSCULAR | Qty: 1000 | Status: AC

## 2015-06-21 NOTE — Progress Notes (Signed)
hgb of 7.3 today, decreased from 9 yesterday. CT negative for bleeding. Hypotensive and tachycardic. Will transfuse 1 unit of PRBC. Continue lopressor 12.'5mg'$  BID, hold for SBP less than 90. Discussed with Dr. Claiborne Billings.   Cristian Moss, [place]

## 2015-06-21 NOTE — Progress Notes (Signed)
Patient Name: Cristian Moss Date of Encounter: 06/21/2015  Primary Cardiologist: Dr. Gwenlyn Found   Principal Problem:   Claudication [facility]) Active Problems:   Essential hypertension   EMPHYSEMA   Seizure disorder ([employer])   Normocytic anemia   Tobacco abuse   Cavitating mass in right upper lung lobe   Coronary artery disease due to lipid rich plaque   Sepsis (HCC)   Dehydration   Low back pain   Chronic obstructive pulmonary disease (HCC)   PAD (peripheral artery disease) ([place])    SUBJECTIVE  Has been having back pain since last month. Denies any CP or SOB. No more SOB than baseline   CURRENT MEDS . aspirin EC  325 mg Oral Daily  . atorvastatin  80 mg Oral q1800  . bromocriptine  2.5 mg Oral BID  . clopidogrel  75 mg Oral Q breakfast  . ferrous sulfate  325 mg Oral BID WC  . levETIRAcetam  1,500 mg Oral Q12H  . Living Better with Heart Failure Book   Does not apply Once  . losartan  50 mg Oral Daily  . metoprolol tartrate  12.5 mg Oral BID  . mometasone-formoterol  2 puff Inhalation BID  . nicotine  14 mg Transdermal Daily  . pantoprazole  40 mg Oral BID  . phenytoin  50 mg Oral QHS  . phenytoin  300 mg Oral QHS  . piperacillin-tazobactam (ZOSYN)  IV  3.375 g Intravenous 3 times per day  . vancomycin  1,000 mg Intravenous Q12H    OBJECTIVE  Filed Vitals:   06/21/15 0400 06/21/15 0500 06/21/15 0600 06/21/15 0744  BP: 111/58 111/53 102/60 110/63  Pulse: 98 101 97 103  Temp:    98.5 F (36.9 C)  TempSrc:    Oral  Resp: '22 25 23 25  '$ Height:      Weight:      SpO2: 92% 93% 93% 97%    Intake/Output Summary (Last 24 hours) at 06/21/15 0809 Last data filed at 06/21/15 0600  Gross per 24 hour  Intake 1462.5 ml  Output    500 ml  Net  962.5 ml   Filed Weights   06/20/15 0544 06/21/15 0327  Weight: 165 lb (74.844 kg) 164 lb 14.5 oz (74.8 kg)    PHYSICAL EXAM  General: Pleasant, NAD. Neuro: Alert and oriented X 3. Moves all extremities spontaneously. Psych:  Normal affect. HEENT:  Normal  Neck: Supple without bruits or JVD. Lungs:  Resp regular and unlabored. Bilateral rhonchi Heart: RRR no s3, s4, or murmurs. L femoral cath site stable, no bruit or hematoma Abdomen: Soft, non-tender, non-distended, BS + x 4.  Extremities: No clubbing, cyanosis or edema. DP/PT/Radials 2+ and equal bilaterally.  Accessory Clinical Findings  CBC  Recent Labs  06/20/15 1926 06/21/15 0428  WBC 23.9* 19.5*  NEUTROABS 19.5*  --   HGB 7.8* 7.3*  HCT 24.6* 24.3*  MCV 90.8 91.4  PLT 309 073   Basic Metabolic Panel  Recent Labs  06/20/15 1926 06/21/15 0428  NA 139 139  K 3.9 4.0  CL 114* 115*  CO2 16* 17*  GLUCOSE 152* 110*  BUN 23* 20  CREATININE 1.14 1.02  CALCIUM 7.8* 7.5*   Liver Function Tests  Recent Labs  06/20/15 1926  AST 68*  ALT 27  ALKPHOS 82  BILITOT 0.3  PROT 6.0*  ALBUMIN 1.9*   TELE Sinus tach without significant ventricular ectopy    ECG  No new EKG  Echocardiogram 05/04/2015  -  Left ventricle: The cavity size was normal. Wall thickness was  normal. Systolic function was mildly reduced. The estimated  ejection fraction was in the range of 45% to 50%. There is  hypokinesis of the apicalanteroseptal myocardium. - Right ventricle: Systolic function was mildly reduced. - Pulmonary arteries: Systolic pressure was moderately increased.  PA peak pressure: 54 mm Hg (S).    Radiology/Studies  Dg Lumbar Spine Complete  06/08/2015  CLINICAL DATA:  Low back pain for several days.  No injury. EXAM: LUMBAR SPINE - COMPLETE 4+ VIEW COMPARISON:  CT abdomen and pelvis 05/05/2015 FINDINGS: There are 5 non rib-bearing lumbar type vertebral bodies. Vertebral alignment is normal. Vertebral body heights are preserved. There is minimal disc space narrowing at L5-S1, with vacuum disc phenomenon noted on the prior CT. Disc space heights are preserved elsewhere. A right L5 pars defect is noted. Atherosclerotic aortic calcification  is noted. IMPRESSION: 1. No acute osseous abnormality identified. 2. 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 Minimal L5-S1 disc narrowing. Electronically Signed   By: Logan Bores M.D.   On: 06/08/2015 13:07   Ct Chest Wo Contrast  06/08/2015  CLINICAL DATA:  70 year old male with cavitary right upper lobe lung mass presenting for follow-up status post antibiotic therapy. EXAM: CT CHEST WITHOUT CONTRAST TECHNIQUE: Multidetector CT imaging of the chest was performed following the standard protocol without IV contrast. COMPARISON:  05/04/2015 chest CT . FINDINGS: Mediastinum/Nodes: Normal heart size. No pericardial fluid/thickening. Extensive coronary atherosclerosis. Atherosclerotic nonaneurysmal thoracic aorta. Stable dilated main pulmonary artery (3.1 cm diameter). Normal visualized thyroid. Normal esophagus. No axillary adenopathy. No pathologically enlarged mediastinal lymph nodes. Stable mildly enlarged 1.4 cm right hilar node (series 2/ image 29). No gross left hilar adenopathy on this noncontrast study. Lungs/Pleura: No pneumothorax. No pleural effusion. There are layering inspissated secretions throughout the lower trachea and bilateral mainstem bronchi. There is mild centrilobular emphysema and diffuse bronchial wall thickening. There is a large 8.9 x 6.8 cm lung mass in the posterior right upper lobe (series 3/ image 25), which abuts the visceral pleural and right major fissure, previously measuring 7.6 x 5.8 cm on 05/04/2015, significantly increased in size. Please note that the mass significantly distorts the superior aspect of the right major fissure in the mass may traverse the fissure into the superior segment of the right lower lobe. There is patchy consolidation, ground-glass opacity and reticulation throughout the dependent and basilar right upper lobe surrounding the right upper lobe mass, not definitely changed. Solid 1.2 cm lingular pulmonary nodule (series 3/image 35), previously 1.1 cm on  05/04/2015 and 0.8 cm on 01/17/2009, not definitely changed in the interval. New atelectasis in the posterior lingula. No new significant pulmonary nodules. Upper abdomen: Partial visualization of large left adrenal mass measuring at least 7.1 x 6.1 cm, previously 6.3 x 5.8 cm, increased in size. Musculoskeletal: There is new lucency and irregularity of the visualize superior endplate of the L1 vertebral body, cannot exclude L1 fracture. IMPRESSION: 1. 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 2. No appreciable change in nonspecific patchy consolidation, ground-glass opacity and reticulation throughout the posterior/basilar right upper lobe surrounding the lung mass, which could represent alveolar hemorrhage and/or lymphangitic tumor spread. 3. Solid 1.2 cm lingular pulmonary nodule, slowly growing since 2010, indeterminate. 4. Stable mild right hilar lymphadenopathy,  suspicious for nodal metastasis. No mediastinal lymphadenopathy. 5. 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 6. Recommend further evaluation of these findings with PET-CT. 7. New lucency and irregularity of the visualized superior endplate of the L1 vertebral body, cannot exclude L1 vertebral fracture. Consider correlation with lumbar spine MRI as clinically warranted. 8. Additional findings include coronary atherosclerosis, chronic main pulmonary artery dilation suggesting pulmonary arterial hypertension and mild emphysema. Electronically Signed   By: Ilona Sorrel M.D.   On: 06/08/2015 13:03   Dg Chest Port 1 View  06/20/2015  CLINICAL DATA:  Tachycardia, chills, elevated white blood cells EXAM: PORTABLE CHEST 1 VIEW COMPARISON:   06/08/2015 FINDINGS: There is right upper lobe consolidation. There is no pleural effusion or pneumothorax. The heart and mediastinal contours are unremarkable. The osseous structures are unremarkable. IMPRESSION: Right upper lobe consolidation most concerning for pneumonia. Given the masslike area in the right upper lobe on previous CT of the chest dated 06/09/2015 this is concerning for possible intrapulmonary abscess. Electronically Signed   By: Kathreen Devoid   On: 06/20/2015 16:31    ASSESSMENT AND PLAN  1. PAD:  - doppler 05/31/2015 right ABI of 0.72 with high grade disease in his R SFA and L ABI 0.59 with occluded L SFA  - LE angiography 06/20/2015 [facility] 1 directional atherectomy with drug eluding balloon angioplasty R SFA  2. SIRS with leukocytosis: WBC trend down from 26 down to 19.5  3. CAD s/p DES to RCA  - cath 05/06/2015 with 80% prox RCA treated with DES, 65% 1st Marginal treated medically.   - echo 05/04/2015 EF 45-50%, hypokinesis of apicalanteroseptal myocardium, PA peak pressure 19mHg  4. Worsening anemia: looking back, his hgb has been trending down since Feb 2017  - patient has had a back pain since last cath on 2/10, interesting the cath was radial, but he denies any back pain prior to that, therefore will obtain CT of abdomen and pelvis without contrast to make sure there is no retroperitoneal bleed. He had CT of abdomen with contrast on 2/9 which recommended PET-CT to assess his lung and renal mass, also had liver nodule which recommended 6 month followup CT with contrast  He also states he occasionally cough up some blood, not a whole lot, but "enough to get his attention"  5. Cavitating mass in RUL, note to have large 8.9x6.8 xm lung mass in posterior R upper lobe  - per note on 3/27 "Dr. BGwenlyn Foundcalled Dr. MVaughan Brownerand it was decided to perform this while inpatient by interventional radiology. We will stop the Plavix therapy today and plan to start IV Cangrelor (short acting  antiplatelet) on Wednesday. He then can be restarted on Plavix therapy after his biopsy."  - will d/c plavix for now, MD to place IV Cangrelor for tomorrow, (not sure why tomorrow) ?timing of biopsy, likely Friday, radiology consulted by pulmonology  6. L adrenal mass: recent CT of chest 3/15, 7.1x6.1 cm large L adrenal mass increased in size, suspicious for metastasis from lung mass  7. HTN 8. Stage II CKD 9. 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 Agree with assessment and plan. Complicated patient with multiple issues as well outlined above.  Aware [facility] improved LE [facility] Resting pulse is 110. Will increase metoprolol to 25 mg  bid and titrate as BP allows.  Plan for lung bx later this week. L groin stable from procedure yesterday. For CT as above to assess back pain and make certain no retroperitoneal bleed. H/H 7.3/24.3.  If further drops will need PRBC tx with CAD.   Troy Sine, MD, [facility] 06/21/2015 10:20 AM

## 2015-06-21 NOTE — Progress Notes (Signed)
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 I spoke with Dr. Gwenlyn Found and we plan on a biopsy for tissue diagnosis this week with IV Cangrelor  bridge. I discussed with IR and consult placed. They will evaluate the pt today. The easiest approach may be the adrenal mass. Other option is to approach the lung mass with CT guided needle.   Appreciate the help from the Cardiology and IR services.  Please call me or the inpatient Pulmonary service with any questions.  Marshell Garfinkel MD [facility] Pager [phone] If no answer or after 3pm call: [phone] 06/21/2015, 8:56 AM

## 2015-06-21 NOTE — Progress Notes (Signed)
TR[facility] will sign off Now that PCCM is consulted. Please call if need further assistance from [facility], Thank you.

## 2015-06-21 NOTE — Consult Note (Signed)
Chief Complaint: Patient was seen in consultation today for right lung mass biopsy at the request of Dr Ancil Linsey and Dr Marshell Garfinkel  Referring Physician(s): Dr Marshell Garfinkel  Supervising Physician: Jacqulynn Cadet  History of Present Illness: Cristian Moss is a 70 y.o. male   Pt with Right lung mass CT 06/08/15: IMPRESSION: 1. 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 2. No appreciable change in nonspecific patchy consolidation, ground-glass opacity and reticulation throughout the posterior/basilar right upper lobe surrounding the lung mass, which could represent alveolar hemorrhage and/or lymphangitic tumor spread. 3. Solid 1.2 cm lingular pulmonary nodule, slowly growing since 2010, indeterminate. 4. Stable mild right hilar lymphadenopathy, suspicious for nodal metastasis. No mediastinal lymphadenopathy. 5. 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 6. Recommend further evaluation of these findings with PET-CT. 7. New lucency and irregularity of the visualized superior endplate of the L1 vertebral body, cannot exclude L1 vertebral fracture. Consider correlation with lumbar spine MRI as clinically warranted. 8. Additional findings include coronary atherosclerosis, chronic main pulmonary artery dilation suggesting pulmonary arterial hypertension and mild emphysema. Appears metastatic disease Hyponatremia Seizure disorder CAD with DES placed just 6 weeks ago PAD with intervention of R SFA just 06/20/15 On Plavix until yesterday  LD 06/19/15 Bridging on IV Cangrelor now Request for Right lung mass biopsy vs adrenal mass bx Dr Laurence Ferrari has reviewed  imaging and approves Rt lung mass bx   Past Medical History  Diagnosis Date  . Essential hypertension   . Seizures ([place])   . COPD (chronic obstructive pulmonary disease) ([place])   . Thrombocytopenia ([employer])   . 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 BPH (benign prostatic hypertrophy)   . Erectile dysfunction   . Empty sella syndrome ([place])     History of  . CKD (chronic kidney disease), stage III   . Hyponatremia     history of  . Tobacco abuse     Past Surgical History  Procedure Laterality Date  . Finger amputation Left     5th finger  . Cardiac catheterization N/A 05/06/2015    Procedure: Left Heart Cath and Coronary Angiography;  Surgeon: Leonie Man, MD;  Location: [facility];  Service: Cardiovascular;  Laterality: N/A;  . Cardiac catheterization N/A 05/06/2015    Procedure: Coronary Stent Intervention;  Surgeon: Leonie Man, MD;  Location: [facility];  Service: Cardiovascular;  Laterality: N/A;  . Peripheral vascular catheterization N/A 06/20/2015    Procedure: Abdominal Aortogram w/Lower Extremity;  Surgeon: Lorretta Harp, MD;  Location: [facility];  Service: Cardiovascular;  Laterality: N/A;    Allergies: Review of patient's allergies indicates no known allergies.  Medications: Prior to Admission medications   Medication Sig Start Date End Date Taking? Authorizing Provider  acetaminophen (TYLENOL) 500 MG tablet Take 1,000 mg by mouth every 6 (six) hours as needed for moderate pain.   Yes Historical Provider, MD  albuterol (PROVENTIL HFA;VENTOLIN HFA) 108 (90 Base) MCG/ACT inhaler Inhale 2 puffs into the lungs every 6 (six) hours as needed for wheezing or shortness of breath. 05/09/15  Yes Shanker Kristeen Mans, MD  aspirin EC 81 MG EC tablet Take 1 tablet (81 mg total) by mouth daily. 05/09/15  Yes Jonetta Osgood, MD  atorvastatin (LIPITOR) 80 MG tablet Take 1 tablet (80 mg total) by mouth daily at 6 PM. 05/18/15  Yes Brett Canales, PA-C  bisacodyl  (DULCOLAX) 5 MG EC tablet Take 5 mg by mouth daily as needed for moderate constipation.   Yes Historical Provider, MD  bromocriptine (PARLODEL) 2.5 MG tablet Take 2.5 mg by mouth 2 (two) times daily.    Yes Historical Provider, MD  clopidogrel (PLAVIX) 75 MG tablet Take 1 tablet (75 mg total) by mouth daily with breakfast. 05/18/15  Yes Brett Canales, PA-C  ferrous sulfate 325 (65 FE) MG tablet Take 1 tablet (325 mg total) by mouth 2 (two) times daily with a meal. 05/09/15  Yes Shanker Kristeen Mans, MD  levETIRAcetam (KEPPRA) 750 MG tablet Take 2 tablets (1,500 mg total) by mouth every 12 (twelve) hours. 05/20/15  Yes Adam Telford Nab, DO  losartan (COZAAR) 50 MG tablet Take 1 tablet (50 mg total) by mouth daily. 05/18/15  Yes Brett Canales, PA-C  metoprolol tartrate (LOPRESSOR) 25 MG tablet Take 0.5 tablets (12.5 mg total) by mouth 2 (two) times daily. 05/18/15  Yes Brett Canales, PA-C  nitroGLYCERIN (NITROSTAT) 0.4 MG SL tablet Place 1 tablet (0.4 mg total) under the tongue every 5 (five) minutes as needed for chest pain. FOR 3 DOSES ONLY 05/09/15  Yes Shanker Kristeen Mans, MD  oxyCODONE (OXY IR/ROXICODONE) 5 MG immediate release tablet Take 1 tablet (5 mg total) by mouth every 6 (six) hours as needed for moderate pain. Patient taking differently: Take 5 mg by mouth every 6 (six) hours as needed for moderate pain.  05/09/15  Yes Shanker Kristeen Mans, MD  pantoprazole (PROTONIX) 40 MG tablet Take 1 tablet (40 mg total) by mouth 2 (two) times daily. 05/09/15  Yes Shanker Kristeen Mans, MD  phenytoin (DILANTIN) 100 MG ER capsule Take 3 capsules (300 mg total) by mouth at bedtime. 05/09/15  Yes Shanker Kristeen Mans, MD  phenytoin (DILANTIN) 50 MG tablet Chew 1 tablet (50 mg total) by mouth at bedtime. Along with 300 mg-for a total of 350 mg daily 06/01/15  Yes Adam Telford Nab, DO  budesonide-formoterol (SYMBICORT) 160-4.5 MCG/ACT inhaler Inhale 2 puffs into the lungs 2 (two) times daily. 05/09/15   Shanker Kristeen Mans, MD  nicotine  (NICODERM CQ) 14 mg/24hr patch Place 1 patch (14 mg total) onto the skin daily. 05/09/15   Shanker Kristeen Mans, MD     Family History  Problem Relation Age of Onset  . Asthma Daughter   . Heart disease Sister     ? developed in her 46's  . Other Mother     died in her 71's of unknown causes  . Other Father     pt unaware of father's pmh.    Social History   Social History  . Marital Status: Married    Spouse Name: N/A  . Number of Children: N/A  . Years of Education: N/A   Social History Main Topics  . Smoking status: Former Smoker -- 0.50 packs/day for 47 years    Types: Cigarettes    Quit date: 05/03/2015  . Smokeless tobacco: Never Used  . Alcohol Use: No  . Drug Use: No  . Sexual Activity: No   Other Topics Concern  . None   Social History Narrative   Lives in [place] with wife.  Does not routinely exercise.  Retired - previously worked in [employer] but was forced to retire ~  2010 2/2 lead exposure/poisoning.     Review of Systems: A 12 point ROS discussed and pertinent positives are indicated in the HPI above.  All other systems are negative.  Review of Systems  Constitutional: Positive for activity change, appetite change, fatigue and unexpected weight change.  HENT: Negative for trouble swallowing.   Eyes: Negative for visual disturbance.  Respiratory: Positive for cough and shortness of breath.   Musculoskeletal: Positive for back pain and gait problem.  Neurological: Positive for weakness.  Psychiatric/Behavioral: Negative for behavioral problems and confusion.    Vital Signs: BP 99/58 mmHg  Pulse 112  Temp(Src) 98 F (36.7 C) (Oral)  Resp 21  Ht '5\' 10"'$  (1.778 m)  Wt 164 lb 14.5 oz (74.8 kg)  BMI 23.66 kg/m2  SpO2 92%  Physical Exam  Constitutional: He is oriented to person, place, and time.  Cardiovascular: Normal rate and regular rhythm.   Pulmonary/Chest: He is in respiratory distress. He has wheezes. He has rales.  Abdominal: Soft. Bowel  sounds are normal.  Musculoskeletal: Normal range of motion.  Neurological: He is alert and oriented to person, place, and time.  Skin: Skin is warm.  Psychiatric: He has a normal mood and affect. His behavior is normal. Judgment and thought content normal.  Nursing note and vitals reviewed.   Mallampati Score:  MD Evaluation Airway: WNL Heart: WNL Abdomen: WNL Chest/ Lungs: WNL ASA  Classification: 3 Mallampati/Airway Score: Two  Imaging: Ct Abdomen Pelvis Wo Contrast  06/21/2015  CLINICAL DATA:  Back pain. Catheterization yesterday. Evaluate for retroperitoneal hematoma. EXAM: CT ABDOMEN AND PELVIS WITHOUT CONTRAST TECHNIQUE: Multidetector CT imaging of the abdomen and pelvis was performed following the standard protocol without IV contrast. COMPARISON:  05/05/2015 FINDINGS: Lower chest and abdominal wall: Fatty enlargement of the bilateral inguinal canal. There is a small right pleural effusion which is newly seen recent chest CT, with atelectatic type opacity in the right lower lobe. Hepatobiliary: Low-density foci best in the liver seen on prior enhanced study are not visible by this technique.Cholelithiasis. No signs of obstruction or inflammation. Pancreas: There is a 6 mm low-density mass in the uncinate process, doubtful clinical significance given the lung and left adrenal findings. This will be followed with the adrenal mass. Spleen: Unremarkable. Adrenals/Urinary Tract: Continued enlargement of a left adrenal mass which has cystic density but mural complexity on prior enhanced CT. Mass measures up to 85 mm today. Cortical retention and continued excretion of IV contrast from catheterization yesterday. Although initially concerning for ATN there is stable creatinine clearance. No hydronephrosis. Negative bladder. Reproductive:No pathologic findings. Stomach/Bowel: Suspect proximal gastric diverticulum containing high-density material. Prominent volume of formed stool. No obstruction.  No appendicitis. Vascular/Lymphatic: No retroperitoneal hematoma. Diffuse atherosclerosis No mass or adenopathy. Peritoneal: No ascites or pneumoperitoneum. Musculoskeletal: L1 superior endplate fracture with mild depression, stable from chest CT 06/08/2015. No visualized osseous metastasis. Chronic L5 right pars defect. IMPRESSION: 1. Negative for retroperitoneal hematoma. 2. Trace right pleural effusion with lower lobe atelectasis. 3. Continued growth of the cystic left adrenal mass, now ~8 cm, metastasis favored. 4. L1 superior endplate fracture with stable depression since 06/08/2015. 5. Probable developing constipation. Electronically Signed   By: Monte Fantasia M.D.   On: 06/21/2015 12:54   Dg Lumbar Spine Complete  06/08/2015  CLINICAL DATA:  Low back pain for several days.  No injury. EXAM: LUMBAR SPINE - COMPLETE 4+ VIEW COMPARISON:  CT abdomen and pelvis 05/05/2015 FINDINGS: There are 5 non rib-bearing lumbar type vertebral bodies.  Vertebral alignment is normal. Vertebral body heights are preserved. There is minimal disc space narrowing at L5-S1, with vacuum disc phenomenon noted on the prior CT. Disc space heights are preserved elsewhere. A right L5 pars defect is noted. Atherosclerotic aortic calcification is noted. IMPRESSION: 1. No acute osseous abnormality identified. 2. 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 Minimal L5-S1 disc narrowing. Electronically Signed   By: Logan Bores M.D.   On: 06/08/2015 13:07   Ct Chest Wo Contrast  06/08/2015  CLINICAL DATA:  70 year old male with cavitary right upper lobe lung mass presenting for follow-up status post antibiotic therapy. EXAM: CT CHEST WITHOUT CONTRAST TECHNIQUE: Multidetector CT imaging of the chest was performed following the standard protocol without IV contrast. COMPARISON:  05/04/2015 chest CT . FINDINGS: Mediastinum/Nodes: Normal heart size. No pericardial fluid/thickening. Extensive coronary atherosclerosis. Atherosclerotic nonaneurysmal  thoracic aorta. Stable dilated main pulmonary artery (3.1 cm diameter). Normal visualized thyroid. Normal esophagus. No axillary adenopathy. No pathologically enlarged mediastinal lymph nodes. Stable mildly enlarged 1.4 cm right hilar node (series 2/ image 29). No gross left hilar adenopathy on this noncontrast study. Lungs/Pleura: No pneumothorax. No pleural effusion. There are layering inspissated secretions throughout the lower trachea and bilateral mainstem bronchi. There is mild centrilobular emphysema and diffuse bronchial wall thickening. There is a large 8.9 x 6.8 cm lung mass in the posterior right upper lobe (series 3/ image 25), which abuts the visceral pleural and right major fissure, previously measuring 7.6 x 5.8 cm on 05/04/2015, significantly increased in size. Please note that the mass significantly distorts the superior aspect of the right major fissure in the mass may traverse the fissure into the superior segment of the right lower lobe. There is patchy consolidation, ground-glass opacity and reticulation throughout the dependent and basilar right upper lobe surrounding the right upper lobe mass, not definitely changed. Solid 1.2 cm lingular pulmonary nodule (series 3/image 35), previously 1.1 cm on 05/04/2015 and 0.8 cm on 01/17/2009, not definitely changed in the interval. New atelectasis in the posterior lingula. No new significant pulmonary nodules. Upper abdomen: Partial visualization of large left adrenal mass measuring at least 7.1 x 6.1 cm, previously 6.3 x 5.8 cm, increased in size. Musculoskeletal: There is new lucency and irregularity of the visualize superior endplate of the L1 vertebral body, cannot exclude L1 fracture. IMPRESSION: 1. Significant interval growth of large 8.9 cm posterior right upper lobe lung mass abutting the visceral pleural and abutting and distorting the right major fissure, possibly traversing into the superior segment right lower lobe, most consistent with a  primary bronchogenic carcinoma. 2. No appreciable change in nonspecific patchy consolidation, ground-glass opacity and reticulation throughout the posterior/basilar right upper lobe surrounding the lung mass, which could represent alveolar hemorrhage and/or lymphangitic tumor spread. 3. Solid 1.2 cm lingular pulmonary nodule, slowly growing since 2010, indeterminate. 4. Stable mild right hilar lymphadenopathy, suspicious for nodal metastasis. No mediastinal lymphadenopathy. 5. 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 6. Recommend further evaluation of these findings with PET-CT. 7. New lucency and irregularity of the visualized superior endplate of the L1 vertebral body, cannot exclude L1 vertebral fracture. Consider correlation with lumbar spine MRI as clinically warranted. 8. Additional findings include coronary atherosclerosis, chronic main pulmonary artery dilation suggesting pulmonary arterial hypertension and mild emphysema. Electronically Signed   By: Ilona Sorrel M.D.   On: 06/08/2015 13:03   Dg Chest Port 1 View  06/20/2015  CLINICAL DATA:  Tachycardia, chills,  elevated white blood cells EXAM: PORTABLE CHEST 1 VIEW COMPARISON:  06/08/2015 FINDINGS: There is right upper lobe consolidation. There is no pleural effusion or pneumothorax. The heart and mediastinal contours are unremarkable. The osseous structures are unremarkable. IMPRESSION: Right upper lobe consolidation most concerning for pneumonia. Given the masslike area in the right upper lobe on previous CT of the chest dated 06/09/2015 this is concerning for possible intrapulmonary abscess. Electronically Signed   By: Kathreen Devoid   On: 06/20/2015 16:31    Labs:  CBC:  Recent Labs  06/20/15 0649 06/20/15 1828 06/20/15 1926 06/21/15 0428  WBC 21.1* 26.0* 23.9* 19.5*  HGB 9.0* 8.0* 7.8* 7.3*  HCT 29.3* 26.3* 24.6* 24.3*  PLT 345 306 309 276    COAGS:  Recent  Labs  05/06/15 0204 06/20/15 0649 06/20/15 1926  INR 1.26 1.32 1.58*  APTT  --   --  35    BMP:  Recent Labs  05/08/15 0320 06/20/15 0649 06/20/15 1926 06/21/15 0428  NA 137 139 139 139  K 4.8 4.0 3.9 4.0  CL 105 110 114* 115*  CO2 23 18* 16* 17*  GLUCOSE 138* 124* 152* 110*  BUN 13 25* 23* 20  CALCIUM 8.2* 8.6* 7.8* 7.5*  CREATININE 0.94 1.14 1.14 1.02  GFRNONAA >60 >60 >60 >60  GFRAA >60 >60 >60 >60    LIVER FUNCTION TESTS:  Recent Labs  05/05/15 0522 05/07/15 0305 05/08/15 0320 06/20/15 1926  BILITOT 0.3 0.3 0.2* 0.3  AST 21 15 13* 68*  ALT 10* 12* 11* 27  ALKPHOS 89 79 76 82  PROT 6.3* 5.5* 6.2* 6.0*  ALBUMIN 2.6* 2.2* 2.3* 1.9*    TUMOR MARKERS: No results for input(s): AFPTM, CEA, CA199, CHROMGRNA in the last 8760 hours.  Assessment and Plan:  Right lung mass Hyponatremia Metastatic disease Off Plavix (LD 3/26) Now in IV Cangrelor Scheduled for lung mass bx 3/31 in Radiology We will call for pt and call to turn off Cangrelor prior to procedure Risks and Benefits discussed with the patient including, but not limited to bleeding, hemoptysis, respiratory failure requiring intubation, infection, pneumothorax requiring chest tube placement, stroke from air embolism or even death. All of the patient's questions were answered, patient is agreeable to proceed. Consent signed and in chart.   Thank you for this interesting consult.  I greatly enjoyed meeting Tahji Roberts and look forward to participating in their care.  A copy of this report was sent to the requesting provider on this date.  Electronically Signed: Monia Sabal A 06/21/2015, 1:42 PM   I spent a total of 40 Minutes    in face to face in clinical consultation, greater than 50% of which was counseling/coordinating care for right lung mass bx

## 2015-06-22 ENCOUNTER — Ambulatory Visit (HOSPITAL_COMMUNITY): Payer: PPO

## 2015-06-22 DIAGNOSIS — R918 Other nonspecific abnormal finding of lung field: Secondary | ICD-10-CM | POA: Diagnosis present

## 2015-06-22 DIAGNOSIS — Z8249 Family history of ischemic heart disease and other diseases of the circulatory system: Secondary | ICD-10-CM | POA: Diagnosis not present

## 2015-06-22 DIAGNOSIS — Z6824 Body mass index (BMI) 24.0-24.9, adult: Secondary | ICD-10-CM | POA: Diagnosis not present

## 2015-06-22 DIAGNOSIS — I2510 Atherosclerotic heart disease of native coronary artery without angina pectoris: Secondary | ICD-10-CM | POA: Diagnosis present

## 2015-06-22 DIAGNOSIS — Z720 Tobacco use: Secondary | ICD-10-CM | POA: Diagnosis not present

## 2015-06-22 DIAGNOSIS — G40909 Epilepsy, unspecified, not intractable, without status epilepticus: Secondary | ICD-10-CM

## 2015-06-22 DIAGNOSIS — R000 Tachycardia, unspecified: Secondary | ICD-10-CM | POA: Diagnosis present

## 2015-06-22 DIAGNOSIS — E861 Hypovolemia: Secondary | ICD-10-CM | POA: Diagnosis present

## 2015-06-22 DIAGNOSIS — R748 Abnormal levels of other serum enzymes: Secondary | ICD-10-CM | POA: Diagnosis present

## 2015-06-22 DIAGNOSIS — C3490 Malignant neoplasm of unspecified part of unspecified bronchus or lung: Secondary | ICD-10-CM | POA: Diagnosis not present

## 2015-06-22 DIAGNOSIS — G893 Neoplasm related pain (acute) (chronic): Secondary | ICD-10-CM | POA: Diagnosis present

## 2015-06-22 DIAGNOSIS — J189 Pneumonia, unspecified organism: Secondary | ICD-10-CM | POA: Diagnosis present

## 2015-06-22 DIAGNOSIS — I130 Hypertensive heart and chronic kidney disease with heart failure and stage 1 through stage 4 chronic kidney disease, or unspecified chronic kidney disease: Secondary | ICD-10-CM | POA: Diagnosis present

## 2015-06-22 DIAGNOSIS — Z77011 Contact with and (suspected) exposure to lead: Secondary | ICD-10-CM | POA: Diagnosis present

## 2015-06-22 DIAGNOSIS — E860 Dehydration: Secondary | ICD-10-CM

## 2015-06-22 DIAGNOSIS — M549 Dorsalgia, unspecified: Secondary | ICD-10-CM | POA: Diagnosis not present

## 2015-06-22 DIAGNOSIS — D509 Iron deficiency anemia, unspecified: Secondary | ICD-10-CM | POA: Diagnosis present

## 2015-06-22 DIAGNOSIS — J449 Chronic obstructive pulmonary disease, unspecified: Secondary | ICD-10-CM | POA: Diagnosis not present

## 2015-06-22 DIAGNOSIS — C7972 Secondary malignant neoplasm of left adrenal gland: Secondary | ICD-10-CM | POA: Diagnosis present

## 2015-06-22 DIAGNOSIS — N400 Enlarged prostate without lower urinary tract symptoms: Secondary | ICD-10-CM | POA: Diagnosis present

## 2015-06-22 DIAGNOSIS — M545 Low back pain: Secondary | ICD-10-CM | POA: Diagnosis present

## 2015-06-22 DIAGNOSIS — E872 Acidosis: Secondary | ICD-10-CM | POA: Diagnosis present

## 2015-06-22 DIAGNOSIS — I959 Hypotension, unspecified: Secondary | ICD-10-CM | POA: Diagnosis present

## 2015-06-22 DIAGNOSIS — N2889 Other specified disorders of kidney and ureter: Secondary | ICD-10-CM | POA: Diagnosis present

## 2015-06-22 DIAGNOSIS — N183 Chronic kidney disease, stage 3 (moderate): Secondary | ICD-10-CM | POA: Diagnosis present

## 2015-06-22 DIAGNOSIS — J432 Centrilobular emphysema: Secondary | ICD-10-CM | POA: Diagnosis not present

## 2015-06-22 DIAGNOSIS — Z7902 Long term (current) use of antithrombotics/antiplatelets: Secondary | ICD-10-CM | POA: Diagnosis not present

## 2015-06-22 DIAGNOSIS — M539 Dorsopathy, unspecified: Secondary | ICD-10-CM | POA: Diagnosis not present

## 2015-06-22 DIAGNOSIS — I5041 Acute combined systolic (congestive) and diastolic (congestive) heart failure: Secondary | ICD-10-CM | POA: Diagnosis not present

## 2015-06-22 DIAGNOSIS — R0602 Shortness of breath: Secondary | ICD-10-CM | POA: Diagnosis not present

## 2015-06-22 DIAGNOSIS — Z87891 Personal history of nicotine dependence: Secondary | ICD-10-CM | POA: Diagnosis not present

## 2015-06-22 DIAGNOSIS — Z79899 Other long term (current) drug therapy: Secondary | ICD-10-CM | POA: Diagnosis not present

## 2015-06-22 DIAGNOSIS — Z7982 Long term (current) use of aspirin: Secondary | ICD-10-CM | POA: Diagnosis not present

## 2015-06-22 DIAGNOSIS — I739 Peripheral vascular disease, unspecified: Secondary | ICD-10-CM | POA: Diagnosis present

## 2015-06-22 DIAGNOSIS — R7989 Other specified abnormal findings of blood chemistry: Secondary | ICD-10-CM | POA: Diagnosis not present

## 2015-06-22 DIAGNOSIS — J440 Chronic obstructive pulmonary disease with acute lower respiratory infection: Secondary | ICD-10-CM | POA: Diagnosis present

## 2015-06-22 DIAGNOSIS — I10 Essential (primary) hypertension: Secondary | ICD-10-CM | POA: Diagnosis not present

## 2015-06-22 DIAGNOSIS — N529 Male erectile dysfunction, unspecified: Secondary | ICD-10-CM | POA: Diagnosis present

## 2015-06-22 DIAGNOSIS — A419 Sepsis, unspecified organism: Secondary | ICD-10-CM | POA: Diagnosis not present

## 2015-06-22 DIAGNOSIS — D62 Acute posthemorrhagic anemia: Secondary | ICD-10-CM

## 2015-06-22 DIAGNOSIS — J95821 Acute postprocedural respiratory failure: Secondary | ICD-10-CM | POA: Diagnosis not present

## 2015-06-22 DIAGNOSIS — C3411 Malignant neoplasm of upper lobe, right bronchus or lung: Secondary | ICD-10-CM | POA: Diagnosis present

## 2015-06-22 DIAGNOSIS — R079 Chest pain, unspecified: Secondary | ICD-10-CM | POA: Diagnosis not present

## 2015-06-22 DIAGNOSIS — I5031 Acute diastolic (congestive) heart failure: Secondary | ICD-10-CM | POA: Diagnosis not present

## 2015-06-22 DIAGNOSIS — J9601 Acute respiratory failure with hypoxia: Secondary | ICD-10-CM | POA: Diagnosis not present

## 2015-06-22 DIAGNOSIS — J984 Other disorders of lung: Secondary | ICD-10-CM | POA: Diagnosis not present

## 2015-06-22 DIAGNOSIS — D649 Anemia, unspecified: Secondary | ICD-10-CM | POA: Diagnosis not present

## 2015-06-22 DIAGNOSIS — R64 Cachexia: Secondary | ICD-10-CM | POA: Diagnosis not present

## 2015-06-22 DIAGNOSIS — M4856XA Collapsed vertebra, not elsewhere classified, lumbar region, initial encounter for fracture: Secondary | ICD-10-CM | POA: Diagnosis present

## 2015-06-22 DIAGNOSIS — R6510 Systemic inflammatory response syndrome (SIRS) of non-infectious origin without acute organ dysfunction: Secondary | ICD-10-CM | POA: Diagnosis not present

## 2015-06-22 DIAGNOSIS — I2583 Coronary atherosclerosis due to lipid rich plaque: Secondary | ICD-10-CM | POA: Diagnosis present

## 2015-06-22 DIAGNOSIS — E46 Unspecified protein-calorie malnutrition: Secondary | ICD-10-CM | POA: Diagnosis present

## 2015-06-22 DIAGNOSIS — Z955 Presence of coronary angioplasty implant and graft: Secondary | ICD-10-CM | POA: Diagnosis not present

## 2015-06-22 DIAGNOSIS — I5023 Acute on chronic systolic (congestive) heart failure: Secondary | ICD-10-CM | POA: Diagnosis not present

## 2015-06-22 DIAGNOSIS — E871 Hypo-osmolality and hyponatremia: Secondary | ICD-10-CM | POA: Diagnosis present

## 2015-06-22 DIAGNOSIS — R627 Adult failure to thrive: Secondary | ICD-10-CM | POA: Diagnosis present

## 2015-06-22 DIAGNOSIS — I9589 Other hypotension: Secondary | ICD-10-CM | POA: Diagnosis not present

## 2015-06-22 LAB — TYPE AND SCREEN
ABO/RH(D): O POS
ANTIBODY SCREEN: NEGATIVE
Unit division: 0

## 2015-06-22 LAB — MAGNESIUM: MAGNESIUM: 1.5 mg/dL — AB (ref 1.7–2.4)

## 2015-06-22 LAB — URINALYSIS W MICROSCOPIC (NOT AT ARMC)
BILIRUBIN URINE: NEGATIVE
GLUCOSE, UA: NEGATIVE mg/dL
KETONES UR: NEGATIVE mg/dL
LEUKOCYTES UA: NEGATIVE
NITRITE: NEGATIVE
PH: 5 (ref 5.0–8.0)
Protein, ur: NEGATIVE mg/dL
SPECIFIC GRAVITY, URINE: 1.027 (ref 1.005–1.030)

## 2015-06-22 LAB — LACTIC ACID, PLASMA: Lactic Acid, Venous: 0.8 mmol/L (ref 0.5–2.0)

## 2015-06-22 LAB — URINE CULTURE: Special Requests: NORMAL

## 2015-06-22 LAB — BASIC METABOLIC PANEL
ANION GAP: 7 (ref 5–15)
ANION GAP: 7 (ref 5–15)
BUN: 17 mg/dL (ref 6–20)
BUN: 18 mg/dL (ref 6–20)
CHLORIDE: 117 mmol/L — AB (ref 101–111)
CHLORIDE: 115 mmol/L — AB (ref 101–111)
CO2: 17 mmol/L — ABNORMAL LOW (ref 22–32)
CO2: 17 mmol/L — AB (ref 22–32)
Calcium: 7.4 mg/dL — ABNORMAL LOW (ref 8.9–10.3)
Calcium: 7.4 mg/dL — ABNORMAL LOW (ref 8.9–10.3)
Creatinine, Ser: 1.07 mg/dL (ref 0.61–1.24)
Creatinine, Ser: 1.11 mg/dL (ref 0.61–1.24)
GFR calc Af Amer: >60 mL/min (ref >60)
GFR calc non Af Amer: >60 mL/min (ref >60)
GLUCOSE: 114 mg/dL — AB (ref 65–99)
Glucose, Bld: 116 mg/dL — ABNORMAL HIGH (ref 65–99)
POTASSIUM: 4 mmol/L (ref 3.5–5.1)
POTASSIUM: 3.9 mmol/L (ref 3.5–5.1)
SODIUM: 141 mmol/L (ref 135–145)
Sodium: 139 mmol/L (ref 135–145)

## 2015-06-22 LAB — MRSA PCR SCREENING: MRSA by PCR: NEGATIVE

## 2015-06-22 LAB — TROPONIN I: TROPONIN I: 0.06 ng/mL — AB (ref <0.031)

## 2015-06-22 LAB — TRANSFUSION REACTION
DAT C3: NEGATIVE
POST RXN DAT IGG: NEGATIVE

## 2015-06-22 LAB — GLUCOSE, CAPILLARY: GLUCOSE-CAPILLARY: 112 mg/dL — AB (ref 65–99)

## 2015-06-22 LAB — CBC
HEMATOCRIT: 22.6 % — AB (ref 39.0–52.0)
HEMOGLOBIN: 7.1 g/dL — AB (ref 13.0–17.0)
MCH: 28.6 pg (ref 26.0–34.0)
MCHC: 31.4 g/dL (ref 30.0–36.0)
MCV: 91.1 fL (ref 78.0–100.0)
Platelets: 252 10*3/uL (ref 150–400)
RBC: 2.48 MIL/uL — AB (ref 4.22–5.81)
RDW: 15.8 % — ABNORMAL HIGH (ref 11.5–15.5)
WBC: 19.8 10*3/uL — AB (ref 4.0–10.5)

## 2015-06-22 LAB — HEMOGLOBIN AND HEMATOCRIT, BLOOD
HCT: 25.6 % — ABNORMAL LOW (ref 39.0–52.0)
Hemoglobin: 8.2 g/dL — ABNORMAL LOW (ref 13.0–17.0)

## 2015-06-22 LAB — TSH: TSH: 0.458 u[IU]/mL (ref 0.350–4.500)

## 2015-06-22 LAB — PHOSPHORUS: PHOSPHORUS: 2.9 mg/dL (ref 2.5–4.6)

## 2015-06-22 LAB — CORTISOL: Cortisol, Plasma: 12.8 ug/dL

## 2015-06-22 LAB — PROCALCITONIN: Procalcitonin: 0.75 ng/mL

## 2015-06-22 MED ORDER — ALBUTEROL SULFATE (2.5 MG/3ML) 0.083% IN NEBU
2.5000 mg | INHALATION_SOLUTION | RESPIRATORY_TRACT | Status: DC | PRN
  Administered 2015-06-24 – 2015-06-28 (×4): 2.5 mg via RESPIRATORY_TRACT
  Filled 2015-06-22 (×4): qty 3

## 2015-06-22 MED ORDER — SODIUM CHLORIDE 0.9 % IV SOLN: Freq: Once | INTRAVENOUS | Status: DC

## 2015-06-22 MED ORDER — ARFORMOTEROL TARTRATE 15 MCG/2ML IN NEBU
15.0000 ug | INHALATION_SOLUTION | Freq: Two times a day (BID) | RESPIRATORY_TRACT | Status: DC
  Administered 2015-06-22 – 2015-06-30 (×16): 15 ug via RESPIRATORY_TRACT
  Filled 2015-06-22 (×18): qty 2

## 2015-06-22 MED ORDER — BUDESONIDE 0.5 MG/2ML IN SUSP
0.5000 mg | Freq: Two times a day (BID) | RESPIRATORY_TRACT | Status: DC
  Administered 2015-06-22 – 2015-06-30 (×16): 0.5 mg via RESPIRATORY_TRACT
  Filled 2015-06-22 (×18): qty 2

## 2015-06-22 MED ORDER — SODIUM CHLORIDE 0.9 % IV BOLUS (SEPSIS)
500.0000 mL | Freq: Once | INTRAVENOUS | Status: AC
  Administered 2015-06-22: 500 mL via INTRAVENOUS

## 2015-06-22 MED ORDER — HYDROCORTISONE NA SUCCINATE PF 100 MG IJ SOLR
50.0000 mg | Freq: Four times a day (QID) | INTRAMUSCULAR | Status: DC
  Administered 2015-06-22 – 2015-06-23 (×6): 50 mg via INTRAVENOUS
  Filled 2015-06-22: qty 2
  Filled 2015-06-22: qty 1
  Filled 2015-06-22 (×3): qty 2
  Filled 2015-06-22 (×3): qty 1

## 2015-06-22 MED ORDER — FENTANYL CITRATE (PF) 100 MCG/2ML IJ SOLN
25.0000 ug | INTRAMUSCULAR | Status: DC | PRN
Start: 2015-06-22 — End: 2015-06-24
  Administered 2015-06-22 – 2015-06-24 (×19): 50 ug via INTRAVENOUS
  Filled 2015-06-22 (×19): qty 2

## 2015-06-22 NOTE — Progress Notes (Signed)
Patient Name: Cristian Moss Date of Encounter: 06/22/2015  Primary Cardiologist: Dr. Gwenlyn Found   Principal Problem:   Claudication [facility]) Active Problems:   Essential hypertension   EMPHYSEMA   Seizure disorder ([place])   Normocytic anemia   Tobacco abuse   Cavitating mass in right upper lung lobe   Coronary artery disease due to lipid rich plaque   Sepsis (HCC)   Dehydration   Low back pain   Chronic obstructive pulmonary disease (HCC)   PAD (peripheral artery disease) (HCC)   Back pain    SUBJECTIVE  Still complaining his back pain, somewhat SOB.    CURRENT MEDS . sodium chloride   Intravenous Once  . antiseptic oral rinse  7 mL Mouth Rinse BID  . arformoterol  15 mcg Nebulization BID  . aspirin EC  325 mg Oral Daily  . atorvastatin  80 mg Oral q1800  . bromocriptine  2.5 mg Oral BID  . budesonide (PULMICORT) nebulizer solution  0.5 mg Nebulization BID  . ferrous sulfate  325 mg Oral BID WC  . hydrocortisone sod succinate (SOLU-CORTEF) inj  50 mg Intravenous Q6H  . levETIRAcetam  1,500 mg Oral Q12H  . Living Better with Heart Failure Book   Does not apply Once  . nicotine  14 mg Transdermal Daily  . pantoprazole  40 mg Oral BID  . phenytoin  50 mg Oral QHS  . phenytoin  300 mg Oral QHS  . piperacillin-tazobactam (ZOSYN)  IV  3.375 g Intravenous 3 times per day  . vancomycin  1,000 mg Intravenous Q12H    OBJECTIVE  Filed Vitals:   06/22/15 0838 06/22/15 0900 06/22/15 1000 06/22/15 1115  BP:  97/56 97/63   Pulse:  77 93   Temp: 97.4 F (36.3 C)   97 F (36.1 C)  TempSrc: Axillary   Oral  Resp:  21 22   Height:      Weight:      SpO2:  98% 100%     Intake/Output Summary (Last 24 hours) at 06/22/15 1158 Last data filed at 06/22/15 1000  Gross per 24 hour  Intake   3690 ml  Output    825 ml  Net   2865 ml   Filed Weights   06/20/15 0544 06/21/15 0327 06/22/15 0215  Weight: 165 lb (74.844 kg) 164 lb 14.5 oz (74.8 kg) 170 lb 13.7 oz (77.5 kg)     PHYSICAL EXAM  General: Pleasant, NAD. Neuro: Alert and oriented X 3. Moves all extremities spontaneously. Psych: Normal affect. HEENT:  Normal  Neck: Supple without bruits or JVD. Lungs:  Resp regular and unlabored. Despite SOB, CTA anteriorly Heart: RRR no s3, s4, or murmurs. L femoral cath site stable, no bruit or hematoma Abdomen: Soft, non-tender, non-distended, BS + x 4.  Extremities: No clubbing, cyanosis or edema. DP/PT/Radials 2+ and equal bilaterally.  Accessory Clinical Findings  CBC  Recent Labs  06/20/15 1926 06/21/15 0428 06/22/15 0342  WBC 23.9* 19.5* 19.8*  NEUTROABS 19.5*  --   --   HGB 7.8* 7.3* 7.1*  HCT 24.6* 24.3* 22.6*  MCV 90.8 91.4 91.1  PLT 309 276 448   Basic Metabolic Panel  Recent Labs  06/21/15 0428 06/22/15 0342  NA 139 141  139  K 4.0 4.0  3.9  CL 115* 117*  115*  CO2 17* 17*  17*  GLUCOSE 110* 116*  114*  BUN '20 17  18  '$ CREATININE 1.02 1.11  1.07  CALCIUM 7.5* 7.4*  7.4*  MG  --  1.5*  PHOS  --  2.9   Liver Function Tests  Recent Labs  06/20/15 1926  AST 68*  ALT 27  ALKPHOS 82  BILITOT 0.3  PROT 6.0*  ALBUMIN 1.9*   TELE Sinus tach without significant ventricular ectopy, PACs    ECG  No new EKG  Echocardiogram 05/04/2015  - Left ventricle: The cavity size was normal. Wall thickness was  normal. Systolic function was mildly reduced. The estimated  ejection fraction was in the range of 45% to 50%. There is  hypokinesis of the apicalanteroseptal myocardium. - Right ventricle: Systolic function was mildly reduced. - Pulmonary arteries: Systolic pressure was moderately increased.  PA peak pressure: 54 mm Hg (S).    Radiology/Studies  Ct Abdomen Pelvis Wo Contrast  06/21/2015  CLINICAL DATA:  Back pain. Catheterization yesterday. Evaluate for retroperitoneal hematoma. EXAM: CT ABDOMEN AND PELVIS WITHOUT CONTRAST TECHNIQUE: Multidetector CT imaging of the abdomen and pelvis was performed following  the standard protocol without IV contrast. COMPARISON:  05/05/2015 FINDINGS: Lower chest and abdominal wall: Fatty enlargement of the bilateral inguinal canal. There is a small right pleural effusion which is newly seen recent chest CT, with atelectatic type opacity in the right lower lobe. Hepatobiliary: Low-density foci best in the liver seen on prior enhanced study are not visible by this technique.Cholelithiasis. No signs of obstruction or inflammation. Pancreas: There is a 6 mm low-density mass in the uncinate process, doubtful clinical significance given the lung and left adrenal findings. This will be followed with the adrenal mass. Spleen: Unremarkable. Adrenals/Urinary Tract: Continued enlargement of a left adrenal mass which has cystic density but mural complexity on prior enhanced CT. Mass measures up to 85 mm today. Cortical retention and continued excretion of IV contrast from catheterization yesterday. Although initially concerning for ATN there is stable creatinine clearance. No hydronephrosis. Negative bladder. Reproductive:No pathologic findings. Stomach/Bowel: Suspect proximal gastric diverticulum containing high-density material. Prominent volume of formed stool. No obstruction. No appendicitis. Vascular/Lymphatic: No retroperitoneal hematoma. Diffuse atherosclerosis No mass or adenopathy. Peritoneal: No ascites or pneumoperitoneum. Musculoskeletal: L1 superior endplate fracture with mild depression, stable from chest CT 06/08/2015. No visualized osseous metastasis. Chronic L5 right pars defect. IMPRESSION: 1. Negative for retroperitoneal hematoma. 2. Trace right pleural effusion with lower lobe atelectasis. 3. Continued growth of the cystic left adrenal mass, now ~8 cm, metastasis favored. 4. L1 superior endplate fracture with stable depression since 06/08/2015. 5. Probable developing constipation. Electronically Signed   By: Monte Fantasia M.D.   On: 06/21/2015 12:54   Dg Lumbar Spine  Complete  06/08/2015  CLINICAL DATA:  Low back pain for several days.  No injury. EXAM: LUMBAR SPINE - COMPLETE 4+ VIEW COMPARISON:  CT abdomen and pelvis 05/05/2015 FINDINGS: There are 5 non rib-bearing lumbar type vertebral bodies. Vertebral alignment is normal. Vertebral body heights are preserved. There is minimal disc space narrowing at L5-S1, with vacuum disc phenomenon noted on the prior CT. Disc space heights are preserved elsewhere. A right L5 pars defect is noted. Atherosclerotic aortic calcification is noted. IMPRESSION: 1. No acute osseous abnormality identified. 2. 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 Minimal L5-S1 disc narrowing. Electronically Signed   By: Logan Bores M.D.   On: 06/08/2015 13:07   Ct Chest Wo Contrast  06/08/2015  CLINICAL DATA:  70 year old male with cavitary right upper lobe lung mass presenting for follow-up status post antibiotic therapy. EXAM: CT CHEST WITHOUT CONTRAST TECHNIQUE: Multidetector CT imaging of the chest  was performed following the standard protocol without IV contrast. COMPARISON:  05/04/2015 chest CT . FINDINGS: Mediastinum/Nodes: Normal heart size. No pericardial fluid/thickening. Extensive coronary atherosclerosis. Atherosclerotic nonaneurysmal thoracic aorta. Stable dilated main pulmonary artery (3.1 cm diameter). Normal visualized thyroid. Normal esophagus. No axillary adenopathy. No pathologically enlarged mediastinal lymph nodes. Stable mildly enlarged 1.4 cm right hilar node (series 2/ image 29). No gross left hilar adenopathy on this noncontrast study. Lungs/Pleura: No pneumothorax. No pleural effusion. There are layering inspissated secretions throughout the lower trachea and bilateral mainstem bronchi. There is mild centrilobular emphysema and diffuse bronchial wall thickening. There is a large 8.9 x 6.8 cm lung mass in the posterior right upper lobe (series 3/ image 25), which abuts the visceral pleural and right major fissure, previously  measuring 7.6 x 5.8 cm on 05/04/2015, significantly increased in size. Please note that the mass significantly distorts the superior aspect of the right major fissure in the mass may traverse the fissure into the superior segment of the right lower lobe. There is patchy consolidation, ground-glass opacity and reticulation throughout the dependent and basilar right upper lobe surrounding the right upper lobe mass, not definitely changed. Solid 1.2 cm lingular pulmonary nodule (series 3/image 35), previously 1.1 cm on 05/04/2015 and 0.8 cm on 01/17/2009, not definitely changed in the interval. New atelectasis in the posterior lingula. No new significant pulmonary nodules. Upper abdomen: Partial visualization of large left adrenal mass measuring at least 7.1 x 6.1 cm, previously 6.3 x 5.8 cm, increased in size. Musculoskeletal: There is new lucency and irregularity of the visualize superior endplate of the L1 vertebral body, cannot exclude L1 fracture. IMPRESSION: 1. 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 2. No appreciable change in nonspecific patchy consolidation, ground-glass opacity and reticulation throughout the posterior/basilar right upper lobe surrounding the lung mass, which could represent alveolar hemorrhage and/or lymphangitic tumor spread. 3. Solid 1.2 cm lingular pulmonary nodule, slowly growing since 2010, indeterminate. 4. Stable mild right hilar lymphadenopathy, suspicious for nodal metastasis. No mediastinal lymphadenopathy. 5. 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 6. Recommend further evaluation of these findings with PET-CT. 7. New lucency and irregularity of the visualized superior  endplate of the L1 vertebral body, cannot exclude L1 vertebral fracture. Consider correlation with lumbar spine MRI as clinically warranted. 8. Additional findings include coronary atherosclerosis, chronic main pulmonary artery dilation suggesting pulmonary arterial hypertension and mild emphysema. Electronically Signed   By: Ilona Sorrel M.D.   On: 06/08/2015 13:03   Dg Chest Port 1 View  06/22/2015  CLINICAL DATA:  Respiratory failure . EXAM: PORTABLE CHEST 1 VIEW COMPARISON:  06/20/2015.  CT 06/08/2015. FINDINGS: Mediastinum is stable. Heart size normal. Right upper lobe infiltrate/mass again noted. Again this could represent a pulmonary abscess or neoplasm. Improvement of right upper lobe atelectasis prior exam. Mild bibasilar atelectasis. No pleural effusion or pneumothorax . IMPRESSION: 1. Persistent rounded infiltrate/mass right upper lobe. Again this could represent a pulmonary abscess or neoplasm. Improvement of right upper lobe atelectasis noted on today's exam. 2. Mild bibasilar atelectasis. Electronically Signed   By: Marcello Moores  Register   On: 06/22/2015 07:14   Dg Chest Port 1 View  06/20/2015  CLINICAL DATA:  Tachycardia, chills, elevated white blood cells EXAM: PORTABLE  CHEST 1 VIEW COMPARISON:  06/08/2015 FINDINGS: There is right upper lobe consolidation. There is no pleural effusion or pneumothorax. The heart and mediastinal contours are unremarkable. The osseous structures are unremarkable. IMPRESSION: Right upper lobe consolidation most concerning for pneumonia. Given the masslike area in the right upper lobe on previous CT of the chest dated 06/09/2015 this is concerning for possible intrapulmonary abscess. Electronically Signed   By: Kathreen Devoid   On: 06/20/2015 16:31    ASSESSMENT AND PLAN  1. PAD:  - doppler 05/31/2015 right ABI of 0.72 with high grade disease in his R SFA and L ABI 0.59 with occluded L SFA  - LE angiography 06/20/2015 [facility] 1 directional atherectomy with drug eluding  balloon angioplasty R SFA  2. SIRS with leukocytosis: WBC trend down from 26 down to 19.5  3. CAD s/p DES to RCA  - cath 05/06/2015 with 80% prox RCA treated with DES, 65% 1st Marginal treated medically.   - echo 05/04/2015 EF 45-50%, hypokinesis of apicalanteroseptal myocardium, PA peak pressure 30mHg  4. Worsening anemia: looking back, his hgb has been trending down since Feb 2017  - patient has had a back pain since last cath on 2/10, interesting the cath was radial, but he denies any back pain prior to that. He had CT of abdomen with contrast on 2/9 which recommended PET-CT to assess his lung and renal mass, also had liver nodule which recommended 6 month followup CT with contrast  He also states he occasionally cough up some blood, not a whole lot, but "enough to get his attention".   - CT of abdomen and pelvis on 3/28 did not reveal internal bleeding, but did reveal L1 fracture which may be the source his back pain.  5. Cavitating mass in RUL, note to have large 8.9x6.8 xm lung mass in posterior R upper lobe  - per note on 3/27 "Dr. BGwenlyn Foundcalled Dr. MVaughan Brownerand it was decided to perform this while inpatient by interventional radiology. We will stop the Plavix therapy today and plan to start IV Cangrelor (short acting antiplatelet) on Wednesday. He then can be restarted on Plavix therapy after his biopsy."  - will discuss with MD and pharmacist regarding IV cangrelor, esp give his severe anemia, biopsy likely Friday, radiology consulted by pulmonology  6. L adrenal mass: recent CT of chest 3/15, 7.1x6.1 cm large L adrenal mass increased in size, suspicious for metastasis from lung mass  7. HTN  8. Stage II CKD   9. 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 Fever around period of transfusion: hypotensive after transfusion started around 8PM on the night of 3/28, BP stable for now in 90s. Hgb did not increase, will recheck Hgb and Hct, unclear cause  of fever however need to keep suspicion of febrile nonhemolytic transfusion reaction and hemolytic transfusion reaction in mind. Continue conservative management. Off all BP med.   SHilbert CorriganPA-C Pager: [phone]  Patient seen and examined. Agree with assessment and plan. Complex situation as noted above.  No chest pain. H/H 7.1/22.6. ? Transfusion rxn yesterday. Would hold off on cangrelor today. Fe deficient with 15% saturation, and decreased folate.  Obtain stool guiacs. May need hematolgy evaluation and with cavitary mass in RUL and ? L adrenal mass will see if pulmonary/CCM  service can assume primary care for now and we will follow cardiologically. Pt is s/p recent DES stent to RCA on 05/06/2015 and  s/p stent in RSFA on 06/20/2015.    Troy Sine, MD, [facility] 06/22/2015 1:08 PM

## 2015-06-22 NOTE — Progress Notes (Signed)
Initial Nutrition Assessment  DOCUMENTATION CODES:   Not applicable  INTERVENTION:  -Monitor for diet advancement, supplement accordingly   -If pt to remain NPO, recommend nutrition support   NUTRITION DIAGNOSIS:   Inadequate oral intake related to inability to eat as evidenced by NPO status.  GOAL:   Patient will meet greater than or equal to 90% of their needs  MONITOR:   Diet advancement, Labs, Weight trends  REASON FOR ASSESSMENT:   Consult Poor PO  ASSESSMENT:   Cristian Moss is a 70 y.o. male with a history of hypertension, tobacco abuse, and stage II-III chronic kidney disease. 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  Per chart pt PO is 10%. Pt states he does not know how his appetite was PTA. Pt denies consuming any oral nutritional supplement PTA.   Per chart pt weight is stable. Pt denies any recent weight loss.   Pt discussed with RN, she reports waiting for diet advancement per MD.   Cristian Moss to conduct nutrition focused physical exam at this time.   Medications reviewed. Labs reviewed; magnesium (1.5) low.    Diet Order:  Diet NPO time specified Except for: Sips with Meds  Skin:  Reviewed, no issues  Last BM:  06/20/2015  Height:   Ht Readings from Last 1 Encounters:  06/20/15 '5\' 10"'$  (1.778 m)    Weight:   Wt Readings from Last 1 Encounters:  06/22/15 170 lb 13.7 oz (77.5 kg)    Ideal Body Weight:  75.45 kg  BMI:  Body mass index is 24.52 kg/(m^2).  Estimated Nutritional Needs:   Kcal:  1900-2100 (25 kcal/kg)  Protein:  70-85 grams (0.8-1.1 g/kg)   Fluid:  >/=1.9 L  EDUCATION NEEDS:   No education needs identified at this time  Cristian Moss, Dietetic Intern Pager: [phone]

## 2015-06-22 NOTE — Progress Notes (Signed)
0030- Dr. Percival Spanish paged regarding pt bp decreasing into 70's (sbp); rapid response paged, but are unable to see pt at this time.  0058- Dr. Percival Spanish paged again regarding continued decrease in sbp; and possible need for ICU. Called back and states to transfer pt to medical ICU. Pt is in no distress. Alert and oriented x 3.

## 2015-06-22 NOTE — Consult Note (Signed)
PULMONARY / CRITICAL CARE MEDICINE   Name: Cristian Moss MRN: [id number] DOB: Aug 05, 1945    ADMISSION DATE:  06/20/2015 CONSULTATION DATE:  06/22/15  REFERRING MD:  Percival Spanish  CHIEF COMPLAINT:  Hypotension  HISTORY OF PRESENT ILLNESS:  Cristian Moss is a 70 y.o. male with PMH as outlined below including severe PAD for which he was admitted for LE angiography 06/20/15 with directional atherectomy with drug eluding balloon angioplasty R SFA.  In addition, he is followed by Dr. Vaughan Browner for RUL mass which was recently found after he was admitted 05/04/15 for seizures.  Procedure went well; however post op, pt had intermittent hypotension.  He had leukocytosis (though has had this for several weeks, possibly due to malignancy), and mildly elevated lactate (2.0).  He was placed on empiric abx due to neutrophilic predominance and was given 571m IVF.  On evening of 03/28, his Hgb was noted to be 7.3 (down from 9 the day prior); therefore, 1u PRBC was ordered.  Part way into the transfusion, he had mild fever; therefore, transfusion was stopped due to concern for reaction. He had persistent hypotension early AM hours of 03/29; therefore, he was transferred to the ICU and PCCM was asked to assist with his care.  Per chart review, he had received 12.'5mg'$  lopressor around 10pm the night prior.  Following arrival to ICU, he had additional 5064mIVF bolus and BP improved to 10989ystolic.  Pt complains of back pain (which he has had for several weeks following a mechanical fall at home); otherwise, denies light headedness, headache, chest pain, SOB, N/V/D, abd pain, myalgias.  STAT labs are currently pending.  His antihypertensives have all been discontinued temporarily (can be resumed once BP has normalized).  Of note, pt was unable to have biopsy of RUL lung mass or left adrenal mass at the time of his last admission due to the fact tha the was on dual antiplatelet therapy which could not be stopped per  cardiology.  Since he was admitted 3/27 for RLE atherectomy, Dr. MaVaughan Browneras called and it was felt that pt would be best served to have biopsy while he is hospitalized.  Case was discussed with Dr. BeGwenlyn Foundnd plan was to stop plavix 03/28 and start IV cangrelor as a bridge on 3/29.  Biopsy will then be performed 03/31 by IR and pt can be restarted on plavix following biopsy.  PAST MEDICAL HISTORY :  He  has a past medical history of Essential hypertension; Seizures (H[place] COPD (chronic obstructive pulmonary disease) (HC[place] Thrombocytopenia (HCDowns History of lead exposure; BPH (benign prostatic hypertrophy); Erectile dysfunction; Empty sella syndrome (HCIrwin CKD (chronic kidney disease), stage III; Hyponatremia; and Tobacco abuse.  PAST SURGICAL HISTORY: He  has past surgical history that includes Finger amputation (Left); Cardiac catheterization (N/A, 05/06/2015); Cardiac catheterization (N/A, 05/06/2015); and Cardiac catheterization (N/A, 06/20/2015).  No Known Allergies  No current facility-administered medications on file prior to encounter.   Current Outpatient Prescriptions on File Prior to Encounter  Medication Sig  . albuterol (PROVENTIL HFA;VENTOLIN HFA) 108 (90 Base) MCG/ACT inhaler Inhale 2 puffs into the lungs every 6 (six) hours as needed for wheezing or shortness of breath.  . Marland Kitchenspirin EC 81 MG EC tablet Take 1 tablet (81 mg total) by mouth daily.  . Marland Kitchentorvastatin (LIPITOR) 80 MG tablet Take 1 tablet (80 mg total) by mouth daily at 6 PM.  . bisacodyl (DULCOLAX) 5 MG EC tablet Take 5 mg by mouth daily as needed for moderate constipation.  .Marland Kitchen  bromocriptine (PARLODEL) 2.5 MG tablet Take 2.5 mg by mouth 2 (two) times daily.   . clopidogrel (PLAVIX) 75 MG tablet Take 1 tablet (75 mg total) by mouth daily with breakfast.  . ferrous sulfate 325 (65 FE) MG tablet Take 1 tablet (325 mg total) by mouth 2 (two) times daily with a meal.  . levETIRAcetam (KEPPRA) 750 MG tablet Take 2 tablets (1,500 mg  total) by mouth every 12 (twelve) hours.  Marland Kitchen losartan (COZAAR) 50 MG tablet Take 1 tablet (50 mg total) by mouth daily.  . metoprolol tartrate (LOPRESSOR) 25 MG tablet Take 0.5 tablets (12.5 mg total) by mouth 2 (two) times daily.  . nitroGLYCERIN (NITROSTAT) 0.4 MG SL tablet Place 1 tablet (0.4 mg total) under the tongue every 5 (five) minutes as needed for chest pain. FOR 3 DOSES ONLY  . oxyCODONE (OXY IR/ROXICODONE) 5 MG immediate release tablet Take 1 tablet (5 mg total) by mouth every 6 (six) hours as needed for moderate pain. (Patient taking differently: Take 5 mg by mouth every 6 (six) hours as needed for moderate pain. )  . pantoprazole (PROTONIX) 40 MG tablet Take 1 tablet (40 mg total) by mouth 2 (two) times daily.  . phenytoin (DILANTIN) 100 MG ER capsule Take 3 capsules (300 mg total) by mouth at bedtime.  . phenytoin (DILANTIN) 50 MG tablet Chew 1 tablet (50 mg total) by mouth at bedtime. Along with 300 mg-for a total of 350 mg daily  . budesonide-formoterol (SYMBICORT) 160-4.5 MCG/ACT inhaler Inhale 2 puffs into the lungs 2 (two) times daily.  . nicotine (NICODERM CQ) 14 mg/24hr patch Place 1 patch (14 mg total) onto the skin daily.    FAMILY HISTORY:  His has no family status information on file.   SOCIAL HISTORY: He  reports that he quit smoking about 7 weeks ago. His smoking use included Cigarettes. He has a 23.5 pack-year smoking history. He has never used smokeless tobacco. He reports that he does not drink alcohol or use illicit drugs.  REVIEW OF SYSTEMS:   All negative; except for those that are bolded, which indicate positives.  Constitutional: weight loss, weight gain, night sweats, fevers, chills, fatigue, weakness.  HEENT: headaches, sore throat, sneezing, nasal congestion, post nasal drip, difficulty swallowing, tooth/dental problems, visual complaints, visual changes, ear aches. Neuro: difficulty with speech, weakness, numbness, ataxia. CV:  chest pain, orthopnea,  PND, swelling in lower extremities, dizziness, palpitations, syncope.  Resp: cough, hemoptysis, dyspnea, wheezing. GI  heartburn, indigestion, abdominal pain, nausea, vomiting, diarrhea, constipation, change in bowel habits, loss of appetite, hematemesis, melena, hematochezia.  GU: dysuria, change in color of urine, urgency or frequency, flank pain, hematuria. MSK: joint pain or swelling, decreased range of motion, back pain. Psych: change in mood or affect, depression, anxiety, suicidal ideations, homicidal ideations. Skin: rash, itching, bruising.   SUBJECTIVE:  Asking for pain meds due to back pain.  Otherwise, asymptomatic.  Mental status is clear, SBP 106.  VITAL SIGNS: BP 106/62 mmHg  Pulse 71  Temp(Src) 97.8 F (36.6 C) (Oral)  Resp 21  Ht '5\' 10"'$  (1.778 m)  Wt 74.8 kg (164 lb 14.5 oz)  BMI 23.66 kg/m2  SpO2 87%  HEMODYNAMICS:    VENTILATOR SETTINGS:    INTAKE / OUTPUT: I/O last 3 completed shifts: In: 1682.5 [P.O.:120; I.V.:1462.5; IV Piggyback:100] Out: 825 [Urine:825]   PHYSICAL EXAMINATION: General: Elderly appearing male, in NAD. Neuro: A&O x 3, non-focal.  HEENT: [place]/AT. PERRL, sclerae anicteric. Cardiovascular: RRR, no M/R/G.  Lungs:  Respirations even and unlabored.  CTA bilaterally, No W/R/R. Abdomen: BS x 4, soft, NT/ND.  Musculoskeletal: No gross deformities, no edema.  Skin: Intact, warm, no rashes.  LABS:  BMET  Recent Labs Lab 06/20/15 0649 06/20/15 1926 06/21/15 0428  NA 139 139 139  K 4.0 3.9 4.0  CL 110 114* 115*  CO2 18* 16* 17*  BUN 25* 23* 20  CREATININE 1.14 1.14 1.02  GLUCOSE 124* 152* 110*    Electrolytes  Recent Labs Lab 06/20/15 0649 06/20/15 1926 06/21/15 0428  CALCIUM 8.6* 7.8* 7.5*    CBC  Recent Labs Lab 06/20/15 1828 06/20/15 1926 06/21/15 0428  WBC 26.0* 23.9* 19.5*  HGB 8.0* 7.8* 7.3*  HCT 26.3* 24.6* 24.3*  PLT 306 309 276    Coag's  Recent Labs Lab 06/20/15 0649 06/20/15 1926  APTT  --  35   INR 1.32 1.58*    Sepsis Markers  Recent Labs Lab 06/20/15 1926 06/20/15 2154  LATICACIDVEN 2.2* 2.0  PROCALCITON 1.27  --     ABG No results for input(s): PHART, PCO2ART, PO2ART in the last 168 hours.  Liver Enzymes  Recent Labs Lab 06/20/15 1926  AST 68*  ALT 27  ALKPHOS 82  BILITOT 0.3  ALBUMIN 1.9*    Cardiac Enzymes No results for input(s): TROPONINI, PROBNP in the last 168 hours.  Glucose  Recent Labs Lab 06/22/15 0222  GLUCAP 112*    Imaging Ct Abdomen Pelvis Wo Contrast  06/21/2015  CLINICAL DATA:  Back pain. Catheterization yesterday. Evaluate for retroperitoneal hematoma. EXAM: CT ABDOMEN AND PELVIS WITHOUT CONTRAST TECHNIQUE: Multidetector CT imaging of the abdomen and pelvis was performed following the standard protocol without IV contrast. COMPARISON:  05/05/2015 FINDINGS: Lower chest and abdominal wall: Fatty enlargement of the bilateral inguinal canal. There is a small right pleural effusion which is newly seen recent chest CT, with atelectatic type opacity in the right lower lobe. Hepatobiliary: Low-density foci best in the liver seen on prior enhanced study are not visible by this technique.Cholelithiasis. No signs of obstruction or inflammation. Pancreas: There is a 6 mm low-density mass in the uncinate process, doubtful clinical significance given the lung and left adrenal findings. This will be followed with the adrenal mass. Spleen: Unremarkable. Adrenals/Urinary Tract: Continued enlargement of a left adrenal mass which has cystic density but mural complexity on prior enhanced CT. Mass measures up to 85 mm today. Cortical retention and continued excretion of IV contrast from catheterization yesterday. Although initially concerning for ATN there is stable creatinine clearance. No hydronephrosis. Negative bladder. Reproductive:No pathologic findings. Stomach/Bowel: Suspect proximal gastric diverticulum containing high-density material. Prominent volume of  formed stool. No obstruction. No appendicitis. Vascular/Lymphatic: No retroperitoneal hematoma. Diffuse atherosclerosis No mass or adenopathy. Peritoneal: No ascites or pneumoperitoneum. Musculoskeletal: L1 superior endplate fracture with mild depression, stable from chest CT 06/08/2015. No visualized osseous metastasis. Chronic L5 right pars defect. IMPRESSION: 1. Negative for retroperitoneal hematoma. 2. Trace right pleural effusion with lower lobe atelectasis. 3. Continued growth of the cystic left adrenal mass, now ~8 cm, metastasis favored. 4. L1 superior endplate fracture with stable depression since 06/08/2015. 5. Probable developing constipation. Electronically Signed   By: Monte Fantasia M.D.   On: 06/21/2015 12:54     STUDIES:  CT A / P 03/28 > negative for retroperitoneal hematoma.  Trace right effusion with atx.  Continued growth of cystic left adrenal mass.  L1 superior endplate fx.  CULTURES: Blood 03/28 > Urine 03/28 >  ANTIBIOTICS: Vanc 03/28 >  Zosyn 03/28 >  SIGNIFICANT EVENTS: 03/27 > admitted for RLE atherectomy. 03/28 > hypotension, drop in Hgb > 1u PRBC transfusion started but stopped shortly afterwards due to fever. 03/29 > transferred to ICU for persistent hypotension.  LINES/TUBES: None.  DISCUSSION: 70 y.o. M admitted 03/27 for RLE atherectomy.  Post op, had hypotension and slight drop in Hgb.  Started 1u PRBC transfusion which was later stopped due to fever.  Early AM 03/29, transferred to ICU for persistent hypotension. Of note, has RUL lung mass and left adrenal mass which were incidentally found during last admission.  He has been evaluated by IR who is planning to biopsy RUL lung mass on Fri 03/31 (his plavix has been stopped and he has been started on IV cangrelor bridge).  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 Acute on chronic hypoxic respiratory failure. Hx COPD. Former smoker. P:   IR  planning for biopsy on Fri 03/31. Continue supplemental O2 as needed to maintain SpO2 > 92%. Budesonide / Brovana in lieu of outpatient Dulera. Albuterol PRN. CXR in AM.  CARDIOVASCULAR A:  Hypotension - hypovolemic vs septic. CAD - s/p DES 05/06/15. Hx PVD s/p RLE atherectomy 06/20/15, HTN. P:  Goal SBP > 90 as long as mental status remains clear. Assess cortisol, troponin, lactate. Start stress steroids, d/c if cortisol > 20. D/c hydralazine, losartan, lopressor, morphine - can restart once BP improves again.  RENAL A:   CKD. Hyponatremia - ? Related to malignancy. P:   NS @ 100. BMP in AM.  GASTROINTESTINAL A:   GI prophylaxis. Nutrition. P:   SUP: Pantoprazole. NPO.  HEMATOLOGIC A:   ? Acute blood loss anemia - had drop in Hgb from 9 to 7.3.  1u PRBC transfusion was started but this was discontinued due to fever. Concern for metastatic lung CA - has RUL lung mass + left adrenal mass. VTE Prophylaxis. P:  STAT H/H now. Transfuse for Hgb < 7. IR planning for RUL bx on Fri 03/31. SCD's / cangrelor IV.  INFECTIOUS A:   SIRS - with concern for occult infection of unclear etiology at this point.  Leukocytosis improving. P:   Abx as above (vanc / zosyn).  Follow cultures as above. PCT algorithm to limit abx exposure.  ENDOCRINE A:   No acute issues.   P:   Assess cortisol, TSH.  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 Back pain - due to L1 superior endplate fx following mechanical fall. P:   Continue outpatient keppra, phenytoin. Low dose fentanyl PRN.   Family updated: Wife, daughter, son updated at bedside.  Interdisciplinary Family Meeting v Palliative Care Meeting:  Due by: 06/28/15.  CC time: 35 minutes.   Montey Hora, [facility] Pager: [phone]  or [phone] 06/22/2015, 3:08 AM

## 2015-06-22 NOTE — Care Management Note (Signed)
Case Management Note  Patient Details  Name: Yoshi Mancillas MRN: [id number] Date of Birth: 01/02/1946  Subjective/Objective:     He had persistent hypotension early AM hours of 03/29; therefore, he was transferred to the ICU and PCCM was asked to assist with his care. PFollowing arrival to ICU, he had additional 572m IVF bolus and BP i               Action/Plan:  Pt A and O; states he is from home with wife.  CM will continue to monitor for disposition needs   Expected Discharge Date:                  Expected Discharge Plan:  [place] Referral:     Discharge planning Services  CM Consult  Post Acute Care Choice:    Choice offered to:     DME Arranged:    DME Agency:     HH Arranged:    HH Agency:     Status of Service:  In process, will continue to follow  Medicare Important Message Given:    Date Medicare IM Given:    Medicare IM give by:    Date Additional Medicare IM Given:    Additional Medicare Important Message give by:     If discussed at [place] Stay Meetings, dates discussed:    Additional Comments:  CMaryclare Labrador RN 06/22/2015, 3:49 PM

## 2015-06-22 NOTE — Progress Notes (Signed)
DR. Oletta Darter notified of crackles in lungs, new orders carried out.

## 2015-06-22 NOTE — Progress Notes (Addendum)
[facility] Progress Note Patient Name: Cristian Moss DOB: 1945-07-19 MRN: [id number]   Date of Service  06/22/2015  HPI/Events of Note  Hypotension - BP = 87/60. Last LVEF = 45% to 50%. Hx of metastatic lung CA with adrenal mass.   eICU Interventions  Will order: 1. Bolus with 0.9 NaCl 500 mL IV over 30 minutes now.  2. Random Cortisol level now.  3. Hydrocortisone 50 mg IV now and Q 6 hours.      Intervention Category Major Interventions: Hypotension - evaluation and management  Leanthony Rhett Eugene 06/22/2015, 2:08 AM

## 2015-06-22 NOTE — Progress Notes (Signed)
Call received per floor RN at [facility] regarding Patient with soft BP, at that time BP 80/42. RN already started fluid bolus. Advised RN to call Cardiologist to update on low BP, RRT to see shortly. Pt seen at bedside at 0100. Resting in bed appears to be in no distress. Pt alert oriented, warm and dry touch. Denies SOB, complains of back pain, chronic 9/10. Lung sounds clear, ABD soft, left groin site in tact. Orders received per Dr. Percival Spanish for Patient to transfer to ICU. Bed obtained on 98M, floor RN to transfer Patient now. Family updated at bedside

## 2015-06-22 NOTE — Progress Notes (Signed)
    Called to see this patient with low BP.  He had atherectomy this admission.  Seems to be doing well from that stand point.  However, he has had back and abdominal pain with falling Hbg.  CT does not demonstrate a hematoma.  He was to have a transfusion but had a fever after starting this.  (He had had a low grade temp before starting this.)   He denies chest pain or SOB.    No distress BP 84/49 mmHg  Pulse 66  Temp(Src) 99.2 F (37.3 C) (Oral)  Resp 22  Ht '5\' 10"'$  (1.778 m)  Wt 164 lb 14.5 oz (74.8 kg)  BMI 23.66 kg/m2  SpO2 96%  Lungs:  Clear COR:  RRR, no rub ABD:  Non distended with good bowel sounds, no rebound no guarding EXT:  Left femoral without hematoma  Hypotension:  Possibly related to ongoing infection with unclear source.  He has had an elevated Lactic acid.  He has been cultured and is on antibiotics.  I will discuss with CCM and he will be transferred to step down.

## 2015-06-22 NOTE — Progress Notes (Signed)
Dr. Percival Spanish paged regarding pt temp elevated during blood transfusion. Transfusion stopped and policy and procedure followed for possible blood reaction.  Order given for tylenol PO. Pt is in no distress.

## 2015-06-23 DIAGNOSIS — R6510 Systemic inflammatory response syndrome (SIRS) of non-infectious origin without acute organ dysfunction: Secondary | ICD-10-CM

## 2015-06-23 DIAGNOSIS — I9589 Other hypotension: Secondary | ICD-10-CM

## 2015-06-23 DIAGNOSIS — I2510 Atherosclerotic heart disease of native coronary artery without angina pectoris: Secondary | ICD-10-CM | POA: Diagnosis present

## 2015-06-23 LAB — CBC
HCT: 22 % — ABNORMAL LOW (ref 39.0–52.0)
Hemoglobin: 7.1 g/dL — ABNORMAL LOW (ref 13.0–17.0)
MCH: 29 pg (ref 26.0–34.0)
MCHC: 32.3 g/dL (ref 30.0–36.0)
MCV: 89.8 fL (ref 78.0–100.0)
Platelets: 263 10*3/uL (ref 150–400)
RBC: 2.45 MIL/uL — AB (ref 4.22–5.81)
RDW: 15.8 % — AB (ref 11.5–15.5)
WBC: 33.6 10*3/uL — AB (ref 4.0–10.5)

## 2015-06-23 LAB — BASIC METABOLIC PANEL
ANION GAP: 8 (ref 5–15)
BUN: 17 mg/dL (ref 6–20)
CALCIUM: 7.4 mg/dL — AB (ref 8.9–10.3)
CO2: 15 mmol/L — AB (ref 22–32)
CREATININE: 1.03 mg/dL (ref 0.61–1.24)
Chloride: 116 mmol/L — ABNORMAL HIGH (ref 101–111)
GLUCOSE: 175 mg/dL — AB (ref 65–99)
POTASSIUM: 3.8 mmol/L (ref 3.5–5.1)
Sodium: 139 mmol/L (ref 135–145)

## 2015-06-23 LAB — LEGIONELLA PNEUMOPHILA SEROGP 1 UR AG: L. pneumophila Serogp 1 Ur Ag: NEGATIVE

## 2015-06-23 LAB — PROCALCITONIN: Procalcitonin: 0.59 ng/mL

## 2015-06-23 MED ORDER — SODIUM BICARBONATE 650 MG PO TABS
650.0000 mg | ORAL_TABLET | Freq: Two times a day (BID) | ORAL | Status: DC
  Administered 2015-06-23 – 2015-06-26 (×8): 650 mg via ORAL
  Filled 2015-06-23 (×12): qty 1

## 2015-06-23 NOTE — Progress Notes (Signed)
Patient Name: Cristian Moss Date of Encounter: 06/23/2015  Primary Cardiologist: Dr. Gwenlyn Found   Principal Problem:   Claudication [facility]) Active Problems:   Essential hypertension   EMPHYSEMA   Seizure disorder ([place])   Normocytic anemia   Tobacco abuse   Cavitating mass in right upper lung lobe   Coronary artery disease due to lipid rich plaque   Sepsis (HCC)   Dehydration   Low back pain   Chronic obstructive pulmonary disease (HCC)   PAD (peripheral artery disease) (HCC)   Back pain   Hypotension   SIRS (systemic inflammatory response syndrome) ([place])    SUBJECTIVE  Feeling better than yesterday, denies any CP   CURRENT MEDS . sodium chloride   Intravenous Once  . antiseptic oral rinse  7 mL Mouth Rinse BID  . arformoterol  15 mcg Nebulization BID  . aspirin EC  325 mg Oral Daily  . atorvastatin  80 mg Oral q1800  . bromocriptine  2.5 mg Oral BID  . budesonide (PULMICORT) nebulizer solution  0.5 mg Nebulization BID  . ferrous sulfate  325 mg Oral BID WC  . levETIRAcetam  1,500 mg Oral Q12H  . Living Better with Heart Failure Book   Does not apply Once  . nicotine  14 mg Transdermal Daily  . pantoprazole  40 mg Oral BID  . phenytoin  50 mg Oral QHS  . phenytoin  300 mg Oral QHS  . piperacillin-tazobactam (ZOSYN)  IV  3.375 g Intravenous 3 times per day  . sodium bicarbonate  650 mg Oral BID  . vancomycin  1,000 mg Intravenous Q12H    OBJECTIVE  Filed Vitals:   06/22/15 2327 06/23/15 0900 06/23/15 0904 06/23/15 1425  BP: 115/64   137/67  Pulse: 74   85  Temp: 97.4 F (36.3 C)   97.8 F (36.6 C)  TempSrc: Axillary   Oral  Resp: 18   17  Height:      Weight:      SpO2: 100% 98% 98% 99%    Intake/Output Summary (Last 24 hours) at 06/23/15 1539 Last data filed at 06/23/15 1424  Gross per 24 hour  Intake  977.5 ml  Output    600 ml  Net  377.5 ml   Filed Weights   06/20/15 0544 06/21/15 0327 06/22/15 0215  Weight: 165 lb (74.844 kg) 164 lb 14.5 oz  (74.8 kg) 170 lb 13.7 oz (77.5 kg)    PHYSICAL EXAM  General: Pleasant, NAD. Neuro: Alert and oriented X 3. Moves all extremities spontaneously. Psych: Normal affect. HEENT:  Normal  Neck: Supple without bruits or JVD. Lungs:  Resp regular and unlabored. CTA Heart: RRR no s3, s4, or murmurs.  Abdomen: Soft, non-tender, non-distended, BS + x 4.  Extremities: No clubbing, cyanosis or edema. DP/PT/Radials 2+ and equal bilaterally.  Accessory Clinical Findings  CBC  Recent Labs  06/20/15 1926  06/22/15 0342 06/22/15 1230 06/23/15 0420  WBC 23.9*  < > 19.8*  --  33.6*  NEUTROABS 19.5*  --   --   --   --   HGB 7.8*  < > 7.1* 8.2* 7.1*  HCT 24.6*  < > 22.6* 25.6* 22.0*  MCV 90.8  < > 91.1  --  89.8  PLT 309  < > 252  --  263  < > = values in this interval not displayed. Basic Metabolic Panel  Recent Labs  06/22/15 0342 06/23/15 0420  NA 141  139 139  K 4.0  3.9 3.8  CL 117*  115* 116*  CO2 17*  17* 15*  GLUCOSE 116*  114* 175*  BUN '17  18 17  '$ CREATININE 1.11  1.07 1.03  CALCIUM 7.4*  7.4* 7.4*  MG 1.5*  --   PHOS 2.9  --    Liver Function Tests  Recent Labs  06/20/15 1926  AST 68*  ALT 27  ALKPHOS 82  BILITOT 0.3  PROT 6.0*  ALBUMIN 1.9*   TELE NSR with HR 80s    ECG  No new EKG  Echocardiogram 05/04/2015  - Left ventricle: The cavity size was normal. Wall thickness was  normal. Systolic function was mildly reduced. The estimated  ejection fraction was in the range of 45% to 50%. There is  hypokinesis of the apicalanteroseptal myocardium. - Right ventricle: Systolic function was mildly reduced. - Pulmonary arteries: Systolic pressure was moderately increased.  PA peak pressure: 54 mm Hg (S).    Radiology/Studies  Ct Abdomen Pelvis Wo Contrast  06/21/2015  CLINICAL DATA:  Back pain. Catheterization yesterday. Evaluate for retroperitoneal hematoma. EXAM: CT ABDOMEN AND PELVIS WITHOUT CONTRAST TECHNIQUE: Multidetector CT imaging of  the abdomen and pelvis was performed following the standard protocol without IV contrast. COMPARISON:  05/05/2015 FINDINGS: Lower chest and abdominal wall: Fatty enlargement of the bilateral inguinal canal. There is a small right pleural effusion which is newly seen recent chest CT, with atelectatic type opacity in the right lower lobe. Hepatobiliary: Low-density foci best in the liver seen on prior enhanced study are not visible by this technique.Cholelithiasis. No signs of obstruction or inflammation. Pancreas: There is a 6 mm low-density mass in the uncinate process, doubtful clinical significance given the lung and left adrenal findings. This will be followed with the adrenal mass. Spleen: Unremarkable. Adrenals/Urinary Tract: Continued enlargement of a left adrenal mass which has cystic density but mural complexity on prior enhanced CT. Mass measures up to 85 mm today. Cortical retention and continued excretion of IV contrast from catheterization yesterday. Although initially concerning for ATN there is stable creatinine clearance. No hydronephrosis. Negative bladder. Reproductive:No pathologic findings. Stomach/Bowel: Suspect proximal gastric diverticulum containing high-density material. Prominent volume of formed stool. No obstruction. No appendicitis. Vascular/Lymphatic: No retroperitoneal hematoma. Diffuse atherosclerosis No mass or adenopathy. Peritoneal: No ascites or pneumoperitoneum. Musculoskeletal: L1 superior endplate fracture with mild depression, stable from chest CT 06/08/2015. No visualized osseous metastasis. Chronic L5 right pars defect. IMPRESSION: 1. Negative for retroperitoneal hematoma. 2. Trace right pleural effusion with lower lobe atelectasis. 3. Continued growth of the cystic left adrenal mass, now ~8 cm, metastasis favored. 4. L1 superior endplate fracture with stable depression since 06/08/2015. 5. Probable developing constipation. Electronically Signed   By: Monte Fantasia M.D.    On: 06/21/2015 12:54   Dg Lumbar Spine Complete  06/08/2015  CLINICAL DATA:  Low back pain for several days.  No injury. EXAM: LUMBAR SPINE - COMPLETE 4+ VIEW COMPARISON:  CT abdomen and pelvis 05/05/2015 FINDINGS: There are 5 non rib-bearing lumbar type vertebral bodies. Vertebral alignment is normal. Vertebral body heights are preserved. There is minimal disc space narrowing at L5-S1, with vacuum disc phenomenon noted on the prior CT. Disc space heights are preserved elsewhere. A right L5 pars defect is noted. Atherosclerotic aortic calcification is noted. IMPRESSION: 1. No acute osseous abnormality identified. 2. 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 Minimal L5-S1 disc narrowing. Electronically Signed   By: Logan Bores M.D.   On: 06/08/2015 13:07   Ct Chest Wo Contrast  06/08/2015  CLINICAL DATA:  70 year old male with cavitary right upper lobe lung mass presenting for follow-up status post antibiotic therapy. EXAM: CT CHEST WITHOUT CONTRAST TECHNIQUE: Multidetector CT imaging of the chest was performed following the standard protocol without IV contrast. COMPARISON:  05/04/2015 chest CT . FINDINGS: Mediastinum/Nodes: Normal heart size. No pericardial fluid/thickening. Extensive coronary atherosclerosis. Atherosclerotic nonaneurysmal thoracic aorta. Stable dilated main pulmonary artery (3.1 cm diameter). Normal visualized thyroid. Normal esophagus. No axillary adenopathy. No pathologically enlarged mediastinal lymph nodes. Stable mildly enlarged 1.4 cm right hilar node (series 2/ image 29). No gross left hilar adenopathy on this noncontrast study. Lungs/Pleura: No pneumothorax. No pleural effusion. There are layering inspissated secretions throughout the lower trachea and bilateral mainstem bronchi. There is mild centrilobular emphysema and diffuse bronchial wall thickening. There is a large 8.9 x 6.8 cm lung mass in the posterior right upper lobe (series 3/ image 25), which abuts the visceral pleural  and right major fissure, previously measuring 7.6 x 5.8 cm on 05/04/2015, significantly increased in size. Please note that the mass significantly distorts the superior aspect of the right major fissure in the mass may traverse the fissure into the superior segment of the right lower lobe. There is patchy consolidation, ground-glass opacity and reticulation throughout the dependent and basilar right upper lobe surrounding the right upper lobe mass, not definitely changed. Solid 1.2 cm lingular pulmonary nodule (series 3/image 35), previously 1.1 cm on 05/04/2015 and 0.8 cm on 01/17/2009, not definitely changed in the interval. New atelectasis in the posterior lingula. No new significant pulmonary nodules. Upper abdomen: Partial visualization of large left adrenal mass measuring at least 7.1 x 6.1 cm, previously 6.3 x 5.8 cm, increased in size. Musculoskeletal: There is new lucency and irregularity of the visualize superior endplate of the L1 vertebral body, cannot exclude L1 fracture. IMPRESSION: 1. 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 2. No appreciable change in nonspecific patchy consolidation, ground-glass opacity and reticulation throughout the posterior/basilar right upper lobe surrounding the lung mass, which could represent alveolar hemorrhage and/or lymphangitic tumor spread. 3. Solid 1.2 cm lingular pulmonary nodule, slowly growing since 2010, indeterminate. 4. Stable mild right hilar lymphadenopathy, suspicious for nodal metastasis. No mediastinal lymphadenopathy. 5. 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 6. Recommend further evaluation of these findings with PET-CT. 7. New lucency and  irregularity of the visualized superior endplate of the L1 vertebral body, cannot exclude L1 vertebral fracture. Consider correlation with lumbar spine MRI as clinically warranted. 8. Additional findings include coronary atherosclerosis, chronic main pulmonary artery dilation suggesting pulmonary arterial hypertension and mild emphysema. Electronically Signed   By: Ilona Sorrel M.D.   On: 06/08/2015 13:03   Dg Chest Port 1 View  06/22/2015  CLINICAL DATA:  Respiratory failure . EXAM: PORTABLE CHEST 1 VIEW COMPARISON:  06/20/2015.  CT 06/08/2015. FINDINGS: Mediastinum is stable. Heart size normal. Right upper lobe infiltrate/mass again noted. Again this could represent a pulmonary abscess or neoplasm. Improvement of right upper lobe atelectasis prior exam. Mild bibasilar atelectasis. No pleural effusion or pneumothorax . IMPRESSION: 1. Persistent rounded infiltrate/mass right upper lobe. Again this could represent a pulmonary abscess or neoplasm. Improvement of right upper lobe atelectasis noted on today's exam. 2. Mild bibasilar atelectasis. Electronically  Signed   By: Marcello Moores  Register   On: 06/22/2015 07:14   Dg Chest Port 1 View  06/20/2015  CLINICAL DATA:  Tachycardia, chills, elevated white blood cells EXAM: PORTABLE CHEST 1 VIEW COMPARISON:  06/08/2015 FINDINGS: There is right upper lobe consolidation. There is no pleural effusion or pneumothorax. The heart and mediastinal contours are unremarkable. The osseous structures are unremarkable. IMPRESSION: Right upper lobe consolidation most concerning for pneumonia. Given the masslike area in the right upper lobe on previous CT of the chest dated 06/09/2015 this is concerning for possible intrapulmonary abscess. Electronically Signed   By: Kathreen Devoid   On: 06/20/2015 16:31    ASSESSMENT AND PLAN  1. PAD:  - doppler 05/31/2015 right ABI of 0.72 with high grade disease in his R SFA and L ABI 0.59 with occluded L SFA  - LE angiography 06/20/2015 [facility] 1  directional atherectomy with drug eluding balloon angioplasty R SFA  2. SIRS with leukocytosis: WBC 26 -->19.5 --> 30s  3. CAD s/p DES to RCA  - cath 05/06/2015 with 80% prox RCA treated with DES, 65% 1st Marginal treated medically.   - echo 05/04/2015 EF 45-50%, hypokinesis of apicalanteroseptal myocardium, PA peak pressure 67mHg  4. Worsening anemia: looking back, his hgb has been trending down since Feb 2017  - patient has had a back pain since last cath on 2/10, interesting the cath was radial, but he denies any back pain prior to that. He had CT of abdomen with contrast on 2/9 which recommended PET-CT to assess his lung and renal mass, also had liver nodule which recommended 6 month followup CT with contrast  He also states he occasionally cough up some blood, not a whole lot, but "enough to get his attention".   - CT of abdomen and pelvis on 3/28 did not reveal internal bleeding, but did reveal L1 fracture which may be the source his back pain.   5. Cavitating mass in RUL, note to have large 8.9x6.8 xm lung mass in posterior R upper lobe  - per note on 3/27 "Dr. BGwenlyn Foundcalled Dr. MVaughan Brownerand it was decided to perform this while inpatient by interventional radiology. We will stop the Plavix therapy today and plan to start IV Cangrelor (short acting antiplatelet) on Wednesday. He then can be restarted on Plavix therapy after his biopsy."  - currently off plavix in anticipation of biopsy tomorrow, did not start Cangrelor as bridge given severe anemia. Hopefully can restart plavix after biopsy soon as risk of acute thrombosis increase  6. L adrenal mass: recent CT of chest 3/15, 7.1x6.1 cm large L adrenal mass increased in size, suspicious for metastasis from lung mass  7. HTN  8. Stage II CKD   9. 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 Fever around period of transfusion: still anemia. Fever resolved. WBC went up to > 30.   SHilbert Corrigan PA-C Pager: [phone]   Patient seen and examined. Agree with assessment and plan. Pt was not started on cangrelor with trsnfusion RXN and Hb 7.1; not a candidate for GP2b3a inhibition. There is risk of stent thrombosi off anti-platelet therapy.  WBC 33.6 probably from cavitary lesion ? Abscess. For biopsy tomorrw. Would re-institute plavix  post procedure if stable. Spoke with wife and daughter.   TTroy Sine MD, FDenver Mid Town Surgery Center Ltd3/30/2017 5:27 PM

## 2015-06-23 NOTE — Consult Note (Signed)
   [facility] Inpatient Consult   06/23/2015  Corley Maffeo [birth year] [id number]   Spoke with inpatient RNCM prior to visit with patient. Mr. Varricchio is active with [place] Management program. Please see chart review tab then notes for patient outreach encounters by [facility] RNCM, [facility] Licensed CSW, and [facility] Pharmacist. Martin Majestic to bedside to speak with him about ongoing [place] Management follow up. He remains agreeable. He also inquired about home health services.  Made him aware that writer will pass information along to inpatient RNCM. Mr. Brosh reports he is supposed to have some more testing. He states he is rather tired during bedside visit. Made him aware that [place] Management team was already aware of his hospital admission. Will continue to follow. Left contact information at bedside. Made inpatient RNCM aware of bedside visit and that patient is active with [place] [facility] community team.   Marthenia Rolling, [place], RN,BSN [facility] Liaison [phone]

## 2015-06-23 NOTE — Progress Notes (Signed)
Pt family present, informed about plan of care for tomorrow.  Family and pt aware that we are to collect hemocult with next BM. Last one was 3/27.  Daughter concerned and urging pt to take PRN dulcolax.  Pt refusing at this time.

## 2015-06-23 NOTE — Progress Notes (Signed)
[facility] PROGRESS NOTE  Cristian Moss BCW:[id number] DOB: April 29, 1945 DOA: 06/20/2015 PCP: [name], MD  Brief narrative 70 year old male recently admitted with seizures and found to have right upper lobe mass, severe peripheral artery disease admitted for right angiography on 06/20/2015 with atherectomy and drug-eluting balloon angioplasty of right SFA. Postoperatively patient became hypertensive and had leukocytosis with elevated lactate of 2. Placed on empiric antibiotic and given IV hydration. On the evening of 3/28 globin dropped to 7.3 and given 1 unit PRBC. While being transfused he had low-grade fever and subsequently became persistently hypotensive, was transferred to ICU. Patient was placed on IV hydrocortisone and given IV fluid resuscitation, improved subsequently and transferred to telemetry.  Assessment/Plan: Hypotension with hypovolemia Resolved with IV resuscitation and stress dose steroid. Off blood pressure medications. Continue gentle hydration.  Suspected adrenal insufficiency. Cortisol of 12.8 shows steroid. Will discontinue steroid and monitor  Right upper lobe lung mass Large cavitating RUL mass measuring 8.96.8cm Suspicious for malignancy. History of >40-pack-year smoking. Diagnosed one month back. Biopsy was postponed since patient recently had cardiac cath. IR  biopsy on 3/31.  Large left renal mass (8cm) Needs PT-CT as outpatient. Suspicion for malignancy/ ? Lung metastases. Mass increase in size on recent CT compared to one month back. Needs oncology consult depending upon biopsy results.  Acute on chronic hypoxic respiratory failure Possibly due to SIRS. Wean off oxygen.  SIRS No clear source of infection. On empiric vancomycin and Zosyn. Has leukocytosis which is chronic, further worsened today which is suspect is due to stress dose steroid. Monitor of steroid. Follow cultures.  COPD Stable. Continue Brovana and budesonide.  Severe  peripheral artery disease High-grade disease a SFA status post angiography, atherectomy with drug-eluting balloon angioplasty of R SFA  CAD with recent cardiac cath with 80% proximal RCA stenosis treated with DES Continue aspirin and statin. Plavix held for lung biopsy.  Iron deficiency anemia Progressive drop in H&H. Check Hemoccult. Continue iron supplement. Transfuse as needed.  Seizure disorder Continue Dilantin and Keppra. Follows with outpatient neurology.  Tobacco abuse Counseled strongly on cessation. Nicotine patch  Chronic kidney disease stage II Stable.  Low back pain Patient be due to L1 compression fracture seen on imaging. Pain control and PT Add stool softener.     DVT prophylaxis: SCDs Diet: Heart healthy/nothing by mouth after midnight  Code Status: Full code Family Communication: None at bedside Disposition Plan: Home once workup completed   Consultants:  PC CM  Cardiology  IR  Procedures:  CT chest/abdomen and pelvis  Antibiotics:  IV vancomycin and Zosyn  HPI/Subjective: Seen and examined. Denies any pain or shortness of breath.  Objective: Filed Vitals:   06/22/15 2200 06/22/15 2327  BP: 108/69 115/64  Pulse: 77 74  Temp:  97.4 F (36.3 C)  Resp: 18 18    Intake/Output Summary (Last 24 hours) at 06/23/15 1405 Last data filed at 06/22/15 2200  Gross per 24 hour  Intake  812.5 ml  Output    200 ml  Net  612.5 ml   Filed Weights   06/20/15 0544 06/21/15 0327 06/22/15 0215  Weight: 74.844 kg (165 lb) 74.8 kg (164 lb 14.5 oz) 77.5 kg (170 lb 13.7 oz)    Exam:   General:  Not in distress, fatigued  HEENT: Pallor present, moist mucosa, supple neck  Chest: Clear bilaterally  Cardiovascular: Normal S1 and S2, no murmurs rub or gallop  GI: Soft, nondistended, nontender, bowel sounds present  Musculoskeletal: Warm, no edema  CNS: Alert and oriented  Data Reviewed: Basic Metabolic Panel:  Recent Labs Lab  06/20/15 0649 06/20/15 1926 06/21/15 0428 06/22/15 0342 06/23/15 0420  NA 139 139 139 141  139 139  K 4.0 3.9 4.0 4.0  3.9 3.8  CL 110 114* 115* 117*  115* 116*  CO2 18* 16* 17* 17*  17* 15*  GLUCOSE 124* 152* 110* 116*  114* 175*  BUN 25* 23* '20 17  18 17  '$ CREATININE 1.14 1.14 1.02 1.11  1.07 1.03  CALCIUM 8.6* 7.8* 7.5* 7.4*  7.4* 7.4*  MG  --   --   --  1.5*  --   PHOS  --   --   --  2.9  --    Liver Function Tests:  Recent Labs Lab 06/20/15 1926  AST 68*  ALT 27  ALKPHOS 82  BILITOT 0.3  PROT 6.0*  ALBUMIN 1.9*   No results for input(s): LIPASE, AMYLASE in the last 168 hours. No results for input(s): AMMONIA in the last 168 hours. CBC:  Recent Labs Lab 06/20/15 1828 06/20/15 1926 06/21/15 0428 06/22/15 0342 06/22/15 1230 06/23/15 0420  WBC 26.0* 23.9* 19.5* 19.8*  --  33.6*  NEUTROABS  --  19.5*  --   --   --   --   HGB 8.0* 7.8* 7.3* 7.1* 8.2* 7.1*  HCT 26.3* 24.6* 24.3* 22.6* 25.6* 22.0*  MCV 91.3 90.8 91.4 91.1  --  89.8  PLT 306 309 276 252  --  263   Cardiac Enzymes:  Recent Labs Lab 06/22/15 0342  TROPONINI 0.06*   BNP (last 3 results)  Recent Labs  05/05/15 0522  BNP 226.8*    ProBNP (last 3 results) No results for input(s): PROBNP in the last 8760 hours.  CBG:  Recent Labs Lab 06/22/15 0222  GLUCAP 112*    Recent Results (from the past 240 hour(s))  Culture, blood (x 2)     Status: None (Preliminary result)   Collection Time: 06/20/15  7:35 PM  Result Value Ref Range Status   Specimen Description BLOOD BLOOD LEFT FOREARM  Final   Special Requests BOTTLES DRAWN AEROBIC AND ANAEROBIC 5CC  Final   Culture NO GROWTH 2 DAYS  Final   Report Status PENDING  Incomplete  Culture, blood (x 2)     Status: None (Preliminary result)   Collection Time: 06/20/15  7:50 PM  Result Value Ref Range Status   Specimen Description BLOOD RIGHT ARM  Final   Special Requests BOTTLES DRAWN AEROBIC AND ANAEROBIC 5CC  Final   Culture NO  GROWTH 2 DAYS  Final   Report Status PENDING  Incomplete  Urine culture     Status: None   Collection Time: 06/21/15 12:22 AM  Result Value Ref Range Status   Specimen Description URINE, CLEAN CATCH  Final   Special Requests Normal  Final   Culture 4,000 COLONIES/mL INSIGNIFICANT GROWTH  Final   Report Status 06/22/2015 FINAL  Final  MRSA PCR Screening     Status: None   Collection Time: 06/22/15  2:13 AM  Result Value Ref Range Status   MRSA by PCR NEGATIVE NEGATIVE Final    Comment:        The GeneXpert MRSA Assay (FDA approved for NASAL specimens only), is one component of a comprehensive MRSA colonization surveillance program. It is not intended to diagnose MRSA infection nor to guide or monitor treatment for MRSA infections.      Studies: Dg Chest [facility]  1 View  06/22/2015  CLINICAL DATA:  Respiratory failure . EXAM: PORTABLE CHEST 1 VIEW COMPARISON:  06/20/2015.  CT 06/08/2015. FINDINGS: Mediastinum is stable. Heart size normal. Right upper lobe infiltrate/mass again noted. Again this could represent a pulmonary abscess or neoplasm. Improvement of right upper lobe atelectasis prior exam. Mild bibasilar atelectasis. No pleural effusion or pneumothorax . IMPRESSION: 1. Persistent rounded infiltrate/mass right upper lobe. Again this could represent a pulmonary abscess or neoplasm. Improvement of right upper lobe atelectasis noted on today's exam. 2. Mild bibasilar atelectasis. Electronically Signed   By: [place]   On: 06/22/2015 07:14    Scheduled Meds: . sodium chloride   Intravenous Once  . antiseptic oral rinse  7 mL Mouth Rinse BID  . arformoterol  15 mcg Nebulization BID  . aspirin EC  325 mg Oral Daily  . atorvastatin  80 mg Oral q1800  . bromocriptine  2.5 mg Oral BID  . budesonide (PULMICORT) nebulizer solution  0.5 mg Nebulization BID  . ferrous sulfate  325 mg Oral BID WC  . levETIRAcetam  1,500 mg Oral Q12H  . Living Better with Heart Failure Book   Does  not apply Once  . nicotine  14 mg Transdermal Daily  . pantoprazole  40 mg Oral BID  . phenytoin  50 mg Oral QHS  . phenytoin  300 mg Oral QHS  . piperacillin-tazobactam (ZOSYN)  IV  3.375 g Intravenous 3 times per day  . vancomycin  1,000 mg Intravenous Q12H   Continuous Infusions: . sodium chloride 75 mL/hr at 06/22/15 1950      Time spent: 25 minutes    Louellen Molder  [facility] Pager [phone] If 7PM-7AM, please contact night-coverage at [email or web address], password [facility] 06/23/2015, 2:05 PM  LOS: 1 day

## 2015-06-23 NOTE — Progress Notes (Signed)
PULMONARY / CRITICAL CARE MEDICINE   Name: Cristian Moss MRN: [id number] DOB: [birth year]    ADMISSION DATE:  06/20/2015 CONSULTATION DATE:  06/22/15  REFERRING MD:  Percival Spanish  CHIEF COMPLAINT:  Hypotension  HISTORY OF PRESENT ILLNESS:  Cristian Moss is a 70 y.o. male with PMH as outlined below including severe PAD for which he was admitted for LE angiography 06/20/15 with directional atherectomy with drug eluding balloon angioplasty R SFA.  In addition, he is followed by Dr. Vaughan Browner for RUL mass which was recently found after he was admitted 05/04/15 for seizures.  Procedure went well; however post op, pt had intermittent hypotension.  He had leukocytosis (though has had this for several weeks, possibly due to malignancy), and mildly elevated lactate (2.0).  He was placed on empiric abx due to neutrophilic predominance and was given 521m IVF.  On evening of 03/28, his Hgb was noted to be 7.3 (down from 9 the day prior); therefore, 1u PRBC was ordered.  Part way into the transfusion, he had mild fever; therefore, transfusion was stopped due to concern for reaction. He had persistent hypotension early AM hours of 03/29; therefore, he was transferred to the ICU and PCCM was asked to assist with his care.  Per chart review, he had received 12.'5mg'$  lopressor around 10pm the night prior.  Following arrival to ICU, he had additional 5038mIVF bolus and BP improved to 10517ystolic.  Pt complains of back pain (which he has had for several weeks following a mechanical fall at home); otherwise, denies light headedness, headache, chest pain, SOB, N/V/D, abd pain, myalgias.  STAT labs are currently pending.  His antihypertensives have all been discontinued temporarily (can be resumed once BP has normalized).  Of note, pt was unable to have biopsy of RUL lung mass or left adrenal mass at the time of his last admission due to the fact tha the was on dual antiplatelet therapy which could not be stopped per  cardiology.  Since he was admitted 3/27 for RLE atherectomy, Dr. MaVaughan Browneras called and it was felt that pt would be best served to have biopsy while he is hospitalized.  Case was discussed with Dr. BeGwenlyn Foundnd plan was to stop plavix 03/28 and start IV cangrelor as a bridge on 3/29.  Biopsy will then be performed 03/31 by IR and pt can be restarted on plavix following biopsy.   SUBJECTIVE:  Patient continuing to complain of back pain. Denies any chest pain or pressure. Intermittent cough. Denies any dyspnea.  REVIEW OF SYSTEMS:  No subjective fever or chills. No nausea or vomiting.  VITAL SIGNS: BP 115/64 mmHg  Pulse 74  Temp(Src) 97.4 F (36.3 C) (Axillary)  Resp 18  Ht '5\' 10"'$  (1.778 m)  Wt 170 lb 13.7 oz (77.5 kg)  BMI 24.52 kg/m2  SpO2 98%  HEMODYNAMICS:    VENTILATOR SETTINGS:    INTAKE / OUTPUT: I/O last 3 completed shifts: In: 4682.5 [I.V.:3620; Blood:50; IV Piggyback:1012.5] Out: 800 [Urine:800]   PHYSICAL EXAMINATION: General: Male. Wife and daughter at bedside. No acute distress. Neuro: Grossly nonfocal. Cranial nerves intact. Alert and oriented 3. HEENT: Moist mucous membranes. No scleral icterus. Cardiovascular: Regular rate. No appreciable JVD. Normal S1 & S2. Lungs: Good aeration & clear to auscultation bilaterally. Normal work of breathing. Abdomen: Soft. Nontender. Normal bowel sounds. Integument: Warm and dry. No rash on exposed skin.  LABS:  BMET  Recent Labs Lab 06/21/15 0428 06/22/15 0342 06/23/15 0420  NA 139 141  139  139  K 4.0 4.0  3.9 3.8  CL 115* 117*  115* 116*  CO2 17* 17*  17* 15*  BUN '20 17  18 17  '$ CREATININE 1.02 1.11  1.07 1.03  GLUCOSE 110* 116*  114* 175*    Electrolytes  Recent Labs Lab 06/21/15 0428 06/22/15 0342 06/23/15 0420  CALCIUM 7.5* 7.4*  7.4* 7.4*  MG  --  1.5*  --   PHOS  --  2.9  --     CBC  Recent Labs Lab 06/21/15 0428 06/22/15 0342 06/22/15 1230 06/23/15 0420  WBC 19.5* 19.8*  --  33.6*   HGB 7.3* 7.1* 8.2* 7.1*  HCT 24.3* 22.6* 25.6* 22.0*  PLT 276 252  --  263    Coag's  Recent Labs Lab 06/20/15 0649 06/20/15 1926  APTT  --  35  INR 1.32 1.58*    Sepsis Markers  Recent Labs Lab 06/20/15 1926 06/20/15 2154 06/22/15 0342 06/23/15 0420  LATICACIDVEN 2.2* 2.0 0.8  --   PROCALCITON 1.27  --  0.75 0.59    ABG No results for input(s): PHART, PCO2ART, PO2ART in the last 168 hours.  Liver Enzymes  Recent Labs Lab 06/20/15 1926  AST 68*  ALT 27  ALKPHOS 82  BILITOT 0.3  ALBUMIN 1.9*    Cardiac Enzymes  Recent Labs Lab 06/22/15 0342  TROPONINI 0.06*    Glucose  Recent Labs Lab 06/22/15 0222  GLUCAP 112*    Imaging No results found.   STUDIES:  CT A / P 03/28 > negative for retroperitoneal hematoma.  Trace right effusion with atx.  Continued growth of cystic left adrenal mass.  L1 superior endplate fx.  CULTURES: Blood 03/28 > Urine 03/27:  Negative  MRSA PCR 3/29:  Negative   ANTIBIOTICS: Vanc 03/28 > Zosyn 03/28 >  SIGNIFICANT EVENTS: 03/27 > admitted for RLE atherectomy. 03/28 > hypotension, drop in Hgb > 1u PRBC transfusion started but stopped shortly afterwards due to fever. 03/29 > transferred to ICU for persistent hypotension. 03/29 > transfer to floor  LINES/TUBES: PIV x2  ASSESSMENT / PLAN:  70 y.o. M admitted 03/27 for RLE atherectomy.  Post op, had hypotension and slight drop in Hgb.  Started 1u PRBC transfusion which was later stopped due to fever.  Early AM 03/29, transferred to ICU for persistent hypotension. Has RUL lung mass and left adrenal mass which were incidentally found during last admission.  He has been evaluated by IR who is planning to biopsy RUL lung mass on Fri 03/31 (his plavix has been stopped and he has been started on IV cangrelor bridge).Blood pressure has stabilized. Suspicious for a neuroendocrine tumor given hyponatremia. I did caution the patient's wife and daughter that we would recommend  continued hospitalization until the biopsy result was available to expedite treatment for this patient.  1. RUL Lung Mass:  Planned for biopsy tomorrow by IR. 2. Acute on Chronic Hypoxic Respiratory Failure:  Continue to wean FiO2 for Sat >92%. 3. Hypotension:  Resolved with fluid resuscitation & stress dosed steroids. 4. COPD:  Continuing Brovana & Budesonide in place of Dulera. No evidence of exacerbation. 5. 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 Defer continuing steroid therapy to primary service. 6. SIRS w/ Possible Sepsis:  Cultures pending. Empiric Antibiotics w/ Vancomycin & Zosyn.  We will reassess the patient on 4/3. Please contact the rounding physician if there are any acute changes or further questions.  Sonia Baller Ashok Cordia, M.D.  [facility] Pager:  [phone] After 3pm or if no response, call [phone]  06/23/2015, 12:20 PM

## 2015-06-23 NOTE — Progress Notes (Signed)
Pharmacy Antibiotic Note  Cristian Moss is a 70 y.o. male admitted on 06/20/2015 s/p aortogram. He developed hypotension and chills and will be treated for  pneumonia. Pharmacy has been consulted for vancomycin and Zosyn dosing - day #4. Afebrile, wbc trend up to 33.6 - possibly due to malignancy. PCT and LA trend down. Scheduled for biopsy 3/31. SCr stable 1.03, CrCl~67, UOP 0.3 per documentation.  Plan: Vancomycin 1g IV q12h Zosyn 3.375GM q8h EI Monitor clinical progress, c/s, renal function, abx plan/LOT VT'@SS'$  as indicated  Height: '5\' 10"'$  (177.8 cm) Weight: 170 lb 13.7 oz (77.5 kg) IBW/kg (Calculated) : 73  Temp (24hrs), Avg:96.8 F (36 C), Min:96.3 F (35.7 C), Max:97.4 F (36.3 C)   Recent Labs Lab 06/20/15 0649 06/20/15 1828 06/20/15 1926 06/20/15 2154 06/21/15 0428 06/22/15 0342 06/23/15 0420  WBC 21.1* 26.0* 23.9*  --  19.5* 19.8* 33.6*  CREATININE 1.14  --  1.14  --  1.02 1.11  1.07 1.03  LATICACIDVEN  --   --  2.2* 2.0  --  0.8  --     Estimated Creatinine Clearance: 67.9 mL/min (by C-G formula based on Cr of 1.03).    No Known Allergies  Antimicrobials this admission: 3/27 vanc>> 3/27 zosyn>>  Dose adjustments this admission: n/a  Microbiology results: 3/27 Blood - NGTD 3/28 Urine - NEG 3/29 MRSA - NEG  Elicia Lamp, PharmD, BCPS Clinical Pharmacist Pager [phone] 06/23/2015 11:23 AM

## 2015-06-24 ENCOUNTER — Inpatient Hospital Stay (HOSPITAL_COMMUNITY): Payer: PPO

## 2015-06-24 DIAGNOSIS — R918 Other nonspecific abnormal finding of lung field: Secondary | ICD-10-CM

## 2015-06-24 DIAGNOSIS — I5041 Acute combined systolic (congestive) and diastolic (congestive) heart failure: Secondary | ICD-10-CM

## 2015-06-24 DIAGNOSIS — I509 Heart failure, unspecified: Secondary | ICD-10-CM | POA: Diagnosis present

## 2015-06-24 LAB — MRSA PCR SCREENING: MRSA by PCR: NEGATIVE

## 2015-06-24 LAB — CBC
HEMATOCRIT: 24.8 % — AB (ref 39.0–52.0)
HEMOGLOBIN: 7.5 g/dL — AB (ref 13.0–17.0)
MCH: 27.4 pg (ref 26.0–34.0)
MCHC: 30.2 g/dL (ref 30.0–36.0)
MCV: 90.5 fL (ref 78.0–100.0)
Platelets: 330 10*3/uL (ref 150–400)
RBC: 2.74 MIL/uL — ABNORMAL LOW (ref 4.22–5.81)
RDW: 16.1 % — ABNORMAL HIGH (ref 11.5–15.5)
WBC: 29.3 10*3/uL — ABNORMAL HIGH (ref 4.0–10.5)

## 2015-06-24 LAB — BASIC METABOLIC PANEL
Anion gap: 9 (ref 5–15)
BUN: 12 mg/dL (ref 6–20)
CHLORIDE: 115 mmol/L — AB (ref 101–111)
CO2: 16 mmol/L — AB (ref 22–32)
CREATININE: 0.98 mg/dL (ref 0.61–1.24)
Calcium: 7.6 mg/dL — ABNORMAL LOW (ref 8.9–10.3)
GFR calc non Af Amer: >60 mL/min (ref >60)
Glucose, Bld: 102 mg/dL — ABNORMAL HIGH (ref 65–99)
Potassium: 3.8 mmol/L (ref 3.5–5.1)
Sodium: 140 mmol/L (ref 135–145)

## 2015-06-24 LAB — PROCALCITONIN: Procalcitonin: 0.46 ng/mL

## 2015-06-24 LAB — PROTIME-INR
INR: 1.44 (ref 0.00–1.49)
PROTHROMBIN TIME: 17.6 s — AB (ref 11.6–15.2)

## 2015-06-24 MED ORDER — MIDAZOLAM HCL 2 MG/2ML IJ SOLN
INTRAMUSCULAR | Status: AC | PRN
  Administered 2015-06-24: 1 mg via INTRAVENOUS

## 2015-06-24 MED ORDER — POLYETHYLENE GLYCOL 3350 17 G PO PACK
17.0000 g | PACK | Freq: Every day | ORAL | Status: DC
  Administered 2015-06-24 – 2015-06-28 (×4): 17 g via ORAL
  Filled 2015-06-24 (×5): qty 1

## 2015-06-24 MED ORDER — FUROSEMIDE 10 MG/ML IJ SOLN
80.0000 mg | Freq: Once | INTRAMUSCULAR | Status: AC
  Administered 2015-06-24: 40 mg via INTRAVENOUS

## 2015-06-24 MED ORDER — FOLIC ACID 1 MG PO TABS
1.0000 mg | ORAL_TABLET | Freq: Every day | ORAL | Status: DC
  Administered 2015-06-25 – 2015-06-30 (×6): 1 mg via ORAL
  Filled 2015-06-24 (×6): qty 1

## 2015-06-24 MED ORDER — MORPHINE SULFATE (PF) 2 MG/ML IV SOLN
2.0000 mg | Freq: Once | INTRAVENOUS | Status: DC
  Filled 2015-06-24 (×2): qty 1

## 2015-06-24 MED ORDER — FENTANYL CITRATE (PF) 100 MCG/2ML IJ SOLN
INTRAMUSCULAR | Status: AC | PRN
  Administered 2015-06-24: 25 ug via INTRAVENOUS

## 2015-06-24 MED ORDER — LEVOFLOXACIN IN D5W 750 MG/150ML IV SOLN
750.0000 mg | INTRAVENOUS | Status: DC
  Administered 2015-06-24 – 2015-06-25 (×2): 750 mg via INTRAVENOUS
  Filled 2015-06-24 (×3): qty 150

## 2015-06-24 MED ORDER — ASPIRIN EC 81 MG PO TBEC
81.0000 mg | DELAYED_RELEASE_TABLET | Freq: Every day | ORAL | Status: DC
  Administered 2015-06-25 – 2015-06-30 (×6): 81 mg via ORAL
  Filled 2015-06-24 (×6): qty 1

## 2015-06-24 MED ORDER — LIDOCAINE HCL 1 % IJ SOLN
INTRAMUSCULAR | Status: AC
  Filled 2015-06-24: qty 20

## 2015-06-24 MED ORDER — FENTANYL CITRATE (PF) 100 MCG/2ML IJ SOLN
INTRAMUSCULAR | Status: AC
  Filled 2015-06-24: qty 2

## 2015-06-24 MED ORDER — FUROSEMIDE 10 MG/ML IJ SOLN
40.0000 mg | Freq: Once | INTRAMUSCULAR | Status: AC
  Administered 2015-06-24: 40 mg via INTRAVENOUS
  Filled 2015-06-24: qty 4

## 2015-06-24 MED ORDER — CLOPIDOGREL BISULFATE 75 MG PO TABS
75.0000 mg | ORAL_TABLET | Freq: Every day | ORAL | Status: DC
  Administered 2015-06-24 – 2015-06-30 (×7): 75 mg via ORAL
  Filled 2015-06-24 (×7): qty 1

## 2015-06-24 MED ORDER — MIDAZOLAM HCL 2 MG/2ML IJ SOLN
INTRAMUSCULAR | Status: AC
  Filled 2015-06-24: qty 2

## 2015-06-24 MED ORDER — FENTANYL CITRATE (PF) 100 MCG/2ML IJ SOLN
25.0000 ug | INTRAMUSCULAR | Status: DC | PRN
Start: 2015-06-24 — End: 2015-06-30
  Administered 2015-06-24 – 2015-06-30 (×19): 50 ug via INTRAVENOUS
  Filled 2015-06-24 (×19): qty 2

## 2015-06-24 NOTE — Progress Notes (Signed)
Dr. Claiborne Billings in room, received order for additional '40mg'$  IV lasix, repeat CXR and begin BIPAP.  Pt assisted back to bed with help from 4 RNs.    Respiratory called for BIPAP, he will need to be transferred to step-down bed for continuous monitoring while on BIPAP.  Rapid response notified, per advise from [place] with respiratory, so that bipap can be started, RR can monitor while we are waiting for stepdown bed.

## 2015-06-24 NOTE — Care Management Important Message (Signed)
Important Message  Patient Details  Name: Cristian Moss MRN: [id number] Date of Birth: 1946-01-10   Medicare Important Message Given:  Yes    Nathen May 06/24/2015, 11:28 AM

## 2015-06-24 NOTE — Progress Notes (Signed)
Pt family had some questions regarding medications.  Dilantin should be '50mg'$  chewable at bedtime vs '300mg'$  oral. Pharmacy notified and will be in to verify with pt and family.  There was also a question regarding Benicar?  Pt family states that it should be on his med list.  It is not currently ordered.  Per pharmacy, MD will need to address this order.

## 2015-06-24 NOTE — Progress Notes (Signed)
Told pt if experience any SOB or abnormal breathing difficulties to please notify RT/RN. Pt verbalize understanding. Pt is stable at this time.

## 2015-06-24 NOTE — Progress Notes (Signed)
PT Cancellation Note  Patient Details Name: Cristian Moss MRN: [id number] DOB: December 23, 1945   Cancelled Treatment:    Reason Eval/Treat Not Completed: Patient declined, no reason specified;Fatigue/lethargy limiting ability to participate Pt just given pain medication and difficulty staying awake, politely declining PT at this time. Will follow up.   Marguarite Arbour A Tahiry Spicer 06/24/2015, 3:02 PM Wray Kearns, Christine, DPT [phone]

## 2015-06-24 NOTE — Sedation Documentation (Signed)
Patient is resting comfortably. 

## 2015-06-24 NOTE — Progress Notes (Signed)
Pt increasingly having more difficulty breathing.  Listless and lungs audibly wet.  Dr. Cydney Ok repaged to see if we can give more than the ordered '40mg'$  IV lasix.

## 2015-06-24 NOTE — Progress Notes (Signed)
On arrival of my assessment noted pt in no distress, good chest rise, alert and oriented. pt states that his breathing is fine right now. NIV is in the room on standby at this tine. Pt is comfortable at this time no complications noted. RT will monitor

## 2015-06-24 NOTE — Progress Notes (Addendum)
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 male with a history of hypertension, tobacco abuse, and stage  II-III chronic kidney disease. 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 He has a RUL mass which was recently found in Feb. 2017.  He had left heart cath on 05/06/15, he received DES to RCA. He was admitted for LE angiography on 06/20/15.  He had hypotension, leukocytosis, and mildly elevated lactate following the procedure.       SUBJECTIVE: Feels tired, no appetite.  Denies chest pain, SOB.  OBJECTIVE Filed Vitals:   06/24/15 1011 06/24/15 1016 06/24/15 1016 06/24/15 1039  BP: 109/51  125/64 140/72  Pulse: 102 105  100  Temp:    97.6 F (36.4 C)  TempSrc:      Resp: '27 31  16  '$ Height:      Weight:      SpO2: 82% 83%  95%    Intake/Output Summary (Last 24 hours) at 06/24/15 1155 Last data filed at 06/24/15 0657  Gross per 24 hour  Intake    290 ml  Output   1200 ml  Net   -910 ml   Filed Weights   06/21/15 0327 06/22/15 0215 06/24/15 0603  Weight: 164 lb 14.5 oz (74.8 kg) 170 lb 13.7 oz (77.5 kg) 176 lb 14.4 oz (80.241 kg)    PHYSICAL EXAM General: Well developed, well nourished, male in no acute distress. Head: Normocephalic, atraumatic.  Neck: Supple without bruits, No JVD. Lungs:  Resp regular and unlabored, Scattered  rhonchi Heart: RRR, S1, S2, no S3, S4, or murmur; no rub. Abdomen: Soft, non-tender, non-distended, BS + x 4.  Extremities: No clubbing, cyanosis, no edema.  Neuro: Alert and oriented X 3. Moves all extremities spontaneously. Psych: Normal affect.  LABS: CBC: Recent Labs  06/23/15 0420 06/24/15 0345  WBC 33.6* 29.3*  HGB 7.1* 7.5*  HCT 22.0* 24.8*  MCV 89.8 90.5  PLT 263 330   INR: Recent Labs  06/24/15 0345  INR 0.73   Basic Metabolic Panel: Recent Labs  06/22/15 0342 06/23/15 0420 06/24/15 0345  NA 141  139 139 140  K 4.0  3.9 3.8 3.8  CL 117*  115* 116* 115*  CO2 17*  17* 15* 16*  GLUCOSE 116*  114* 175* 102*  BUN '17  18 17 12  '$ CREATININE 1.11  1.07 1.03 0.98  CALCIUM 7.4*  7.4* 7.4* 7.6*  MG 1.5*  --   --   PHOS 2.9  --   --    Cardiac Enzymes: Recent Labs  06/22/15 0342  TROPONINI 0.06*    Thyroid Function Tests: Recent Labs  06/22/15 0342  TSH 0.458     Current facility-administered medications:  .  0.9 %  sodium chloride infusion, , Intravenous, Continuous,  Anders Simmonds, MD, Last Rate: 75 mL/hr at 06/22/15 1950 .  0.9 %  sodium chloride infusion, , Intravenous, Once, Minus Breeding, MD .  acetaminophen (TYLENOL) tablet 975 mg, 975 mg, Oral, Q6H PRN, Minus Breeding, MD, 975 mg at 06/22/15 1942 .  albuterol (PROVENTIL) (2.5 MG/3ML) 0.083% nebulizer solution 2.5 mg, 2.5 mg, Inhalation, Q3H PRN, Rahul P Desai, PA-C .  antiseptic oral rinse (CPC / CETYLPYRIDINIUM CHLORIDE 0.05%) solution 7 mL, 7 mL, Mouth Rinse, BID, Almyra Deforest, PA, 7 mL at 06/23/15 2234 .  arformoterol (BROVANA) nebulizer solution 15 mcg, 15 mcg, Nebulization, BID, Rahul P Desai, PA-C, 15 mcg at 06/24/15 0802 .  aspirin EC tablet 325 mg, 325 mg, Oral, Daily, Lorretta Harp, MD, 325 mg at 06/24/15 1123 .  atorvastatin (LIPITOR) tablet 80 mg, 80 mg, Oral, q1800, Lorretta Harp, MD, 80 mg at 06/23/15 1602 .  bisacodyl (DULCOLAX) EC tablet 5 mg, 5 mg, Oral, Daily PRN,  Lorretta Harp, MD .  bromocriptine (PARLODEL) tablet 2.5 mg, 2.5 mg, Oral, BID, Lorretta Harp, MD, 2.5 mg at 06/24/15 1123 .  budesonide (PULMICORT) nebulizer solution 0.5 mg, 0.5 mg, Nebulization, BID, Rahul P Desai, PA-C, 0.5 mg at 06/24/15 0802 .  fentaNYL (SUBLIMAZE) 100 MCG/2ML injection, , , ,  .  fentaNYL (SUBLIMAZE) injection 25-50 mcg, 25-50 mcg, Intravenous, Q2H PRN, Rahul P Desai, PA-C, 50 mcg at 06/24/15 1116 .  ferrous sulfate tablet 325 mg, 325 mg, Oral, BID WC, Lorretta Harp, MD, 325 mg at 06/24/15 1123 .  levETIRAcetam (KEPPRA) tablet 1,500 mg, 1,500 mg, Oral, Q12H, Lorretta Harp, MD, 1,500 mg at 06/24/15 1123 .  levofloxacin (LEVAQUIN) IVPB 750 mg, 750 mg, Intravenous, Q24H, Nishant Dhungel, MD .  lidocaine (XYLOCAINE) 1 % (with pres) injection, , , ,  .  Living Better with Heart Failure Book, , Does not apply, Once, Lorretta Harp, MD .  midazolam (VERSED) 2 MG/2ML injection, , , ,  .  nicotine (NICODERM CQ - dosed in mg/24 hours) patch 14 mg, 14 mg, Transdermal, Daily, Lorretta Harp, MD, 14 mg at 06/24/15 1124 .  nitroGLYCERIN (NITROSTAT) SL tablet 0.4 mg, 0.4 mg, Sublingual, Q5 min PRN, Lorretta Harp, MD .  ondansetron [facility]) injection 4 mg, 4 mg, Intravenous, Q6H PRN, Lorretta Harp, MD, 4 mg at 06/20/15 1445 .  oxyCODONE (Oxy IR/ROXICODONE) immediate release tablet 5 mg, 5 mg, Oral, Q6H PRN, Lorretta Harp, MD, 5 mg at 06/24/15 1117 .  pantoprazole (PROTONIX) EC tablet 40 mg, 40 mg, Oral, BID, Lorretta Harp, MD, 40 mg at 06/24/15 1123 .  phenytoin (DILANTIN) chewable tablet 50 mg, 50 mg, Oral, QHS, Lorretta Harp, MD, 50 mg at 06/22/15 2159 .  phenytoin (DILANTIN) ER capsule 300 mg, 300 mg, Oral, QHS, Lorretta Harp, MD, 300 mg at 06/23/15 2221 .  sodium bicarbonate tablet 650 mg, 650 mg, Oral, BID, Nishant Dhungel, MD, 650 mg at 06/24/15 1123 . sodium chloride 75 mL/hr at 06/22/15 1950    TELE:   NSR      Radiology/Studies: Ct  Biopsy  06/24/2015  INDICATION: Enlarging posterior right upper lobe mass EXAM: CT-GUIDED BIOPSY LARGE RIGHT UPPER LOBE CENTRALLY NECROTIC MASS MEDICATIONS: 1% LIDOCAINE LOCALLY ANESTHESIA/SEDATION: 1.0 mg IV Versed; 25 mcg IV Fentanyl Moderate Sedation Time:  20 MINUTES The patient was continuously monitored during the procedure by the interventional radiology nurse under my direct supervision. PROCEDURE: The procedure, risks, benefits, and alternatives were explained to  the patient. Questions regarding the procedure were encouraged and answered. The patient understands and consents to the procedure. The right posterior back was prepped with ChloraPrep in a sterile fashion, and a sterile drape was applied covering the operative field. A sterile gown and sterile gloves were used for the procedure. Previous imaging reviewed. Patient positioned right side down decubitus. Noncontrast localization CT performed. The large posterior right upper lobe mass was localized. Under sterile conditions and local anesthesia, a 17 gauge 6.8 cm access needle was advanced from a posterior intercostal approach to the periphery of the right upper lobe mass. Needle position confirmed with CT. 18 gauge core biopsies obtained and placed in formalin. Needle tract embolized with the biosentry device to aide in pneumothorax prevention. Postprocedure imaging demonstrates no complication. Patient tolerated the procedure well without complication. Vital sign monitoring by nursing staff during the procedure will continue as patient is in the special procedures unit for post procedure observation. FINDINGS: The images document guide needle placement within the large posterior right upper lobe mass. Post biopsy images demonstrate no effusion or pneumothorax. COMPLICATIONS: None immediate. IMPRESSION: Successful CT-guided core biopsy of the right upper lobe mass. Electronically Signed   By: Jerilynn Mages.  Shick M.D.   On: 06/24/2015 11:05     Current  Medications:  . sodium chloride   Intravenous Once  . antiseptic oral rinse  7 mL Mouth Rinse BID  . arformoterol  15 mcg Nebulization BID  . aspirin EC  325 mg Oral Daily  . atorvastatin  80 mg Oral q1800  . bromocriptine  2.5 mg Oral BID  . budesonide (PULMICORT) nebulizer solution  0.5 mg Nebulization BID  . fentaNYL      . ferrous sulfate  325 mg Oral BID WC  . levETIRAcetam  1,500 mg Oral Q12H  . levofloxacin (LEVAQUIN) IV  750 mg Intravenous Q24H  . lidocaine      . Living Better with Heart Failure Book   Does not apply Once  . midazolam      . nicotine  14 mg Transdermal Daily  . pantoprazole  40 mg Oral BID  . phenytoin  50 mg Oral QHS  . phenytoin  300 mg Oral QHS  . sodium bicarbonate  650 mg Oral BID   . sodium chloride 75 mL/hr at 06/22/15 1950    ASSESSMENT AND PLAN: Principal Problem:   Claudication [facility]) Active Problems:   Essential hypertension   EMPHYSEMA   Seizure disorder (HCC)   Normocytic anemia   Tobacco abuse   Cavitating mass in right upper lung lobe   Coronary artery disease due to lipid rich plaque   Sepsis (HCC)   Dehydration   Low back pain   Chronic obstructive pulmonary disease (HCC)   PAD (peripheral artery disease) (HCC)   Back pain   Hypotension   SIRS (systemic inflammatory response syndrome) ([place])   CAD in native artery  1. PAD: - doppler 05/31/2015 right ABI of 0.72 with high grade disease in his R SFA and L ABI 0.59 with occluded L  SFA - LE angiography 06/20/2015 [facility] 1 directional atherectomy with drug eluding balloon angioplasty R SFA  2. SIRS with leukocytosis: WBC 26 -->19.5 --> 30s. Management per IM.   3. CAD s/p DES to RCA: - cath 05/06/2015 with 80% prox RCA treated with DES, 65% 1st Marginal treated medically.  - echo 05/04/2015 EF 45-50%, hypokinesis of apicalanteroseptal myocardium, PA peak pressure 79mHg  4. Worsening anemia: looking back, his hgb has been  trending down since Feb 2017 -  patient has had a back pain since last cath on 2/10, interesting the cath was radial, but he denies any back pain prior to that.  He had CT of abdomen with contrast on 2/9 which recommended PET-CT to assess his lung and renal mass, also had liver nodule which recommended 6 month followup CT with contrast He also states he occasionally cough up some blood, not a whole lot, but "enough to get his attention".  - CT of abdomen and pelvis on 3/28 did not reveal internal bleeding, but did reveal L1 fracture which may be the source his back pain.   5. Cavitating mass in RUL, note to have large 8.9x6.8 xm lung mass in posterior R upper lobe: - per note on 3/27 "Dr. Gwenlyn Found called Dr. Vaughan Browner and it was decided to perform this while inpatient by interventional radiology. We will stop the Plavix therapy today and plan to start IV Cangrelor (short acting antiplatelet) on Wednesday. He then can be restarted on Plavix therapy after his biopsy." -did not start Cangrelor as bridge given severe anemia. - Biopsy completed today.  Will restart Plavix.    6. L adrenal mass: recent CT of chest 3/15, 7.1x6.1 cm large L adrenal mass increased in size, suspicious for metastasis from lung mass  7. HTN: BP well controlled. BP meds held due to hypotension.   8. Stage II CKD: Stable. Cr. Is 0.98, GFR is >60.   9. H/o seizure after lead exposure and poisoning (used to work in a [employer] manager)  10. Fever around period of transfusion: still anemic. Fever resolved. WBC went up to > 30.   Signed, Arbutus Leas , NP 11:55 AM 06/24/2015 Pager [phone]   Patient seen and examined. Agree with assessment and plan. Underwent successful biopsy a large cavitary RUL mass today. Tolerated well.  Plavix resumed without bolus. Decrease ASA to 81 mg.   No angina. WBC slightly improved today at 29.3. H/H slightly improved at 7.5/24.8. Will add emperic iron replacement with 15% Fe sat.and folate with decreased folate level at  5.2. Had transfusion rxn to PRBC.   Troy Sine, MD, [facility] 06/24/2015   Addendum: Pt is more sob presently; diffuse rales. O2 sat in the upper 80's, now in the mid 90's on increased 02.  Will give lasix 80 mg iv now.  F/U CXR now.  Consider institution of BiPAP.  If does not improve may need transfer to stepdown. Troy Sine, MD  06/24/2015 5:21 PM

## 2015-06-24 NOTE — Progress Notes (Signed)
Pt had 8 beat wide complex tachycardia while being turned by NT. Asymptomatic, pt did not feel it. Will continue to monitor.

## 2015-06-24 NOTE — Progress Notes (Signed)
Pt complaining of continuous back pain, he is very uncomfortable in the bed.  PT assisted to recliner requiring significant assistance of 3 staff members.

## 2015-06-24 NOTE — Procedures (Signed)
Successful RUL mass CT Bx No comp Stable Path pending Full report in PACS

## 2015-06-24 NOTE — Progress Notes (Signed)
Pt back from procedure in IR, no bandage covering puncture site upon arrival, RN covered.  No leaking or oozing noted.

## 2015-06-24 NOTE — Progress Notes (Signed)
[facility] PROGRESS NOTE  Cristian Moss IRJ:[id number] DOB: 12-Dec-1945 DOA: 06/20/2015 PCP: [name], MD  Brief narrative 70 year old male recently admitted with seizures and found to have right upper lobe mass, severe peripheral artery disease admitted for right angiography on 06/20/2015 with atherectomy and drug-eluting balloon angioplasty of right SFA. Postoperatively patient became hypertensive and had leukocytosis with elevated lactate of 2. Placed on empiric antibiotic and given IV hydration. On the evening of 3/28 globin dropped to 7.3 and given 1 unit PRBC. While being transfused he had low-grade fever and subsequently became persistently hypotensive, was transferred to ICU. Patient was placed on IV hydrocortisone and given IV fluid resuscitation, improved subsequently and transferred to telemetry.  Assessment/Plan: Hypotension with hypovolemia Resolved with IV resuscitation and stress dose steroid. Off blood pressure medications. Discontinue fluids  Suspected adrenal insufficiency. Cortisol of 12.8 and stress dose steroid.  IV solu-Cortef discontinued.  Right upper lobe lung mass Large cavitating RUL mass measuring 8.96.8cm Suspicious for malignancy. History of >40-pack-year smoking. Diagnosed one month back. Biopsy was postponed since patient recently had cardiac cath. IR  biopsy today.  Large left renal mass (8cm) Needs PET-CT as outpatient. Suspicion for malignancy/ ? Lung metastases. Mass increase in size on recent CT compared to one month back. Needs oncology consult depending upon biopsy results.  Acute on chronic hypoxic respiratory failure Possibly due to SIRS. Wean off oxygen.  SIRS No clear source of infection. On empiric vancomycin and Zosyn. Narrow antibiotics to Levaquin. (To complete a seven-day course) Severe leukocytosis which is likely a combination of SIRS and steroid. Monitor for now.  COPD Stable. Continue Brovana and budesonide.  Severe  peripheral artery disease High-grade disease a SFA status post angiography, atherectomy with drug-eluting balloon angioplasty of R SFA.  CAD with recent cardiac cath with 80% proximal RCA stenosis treated with DES Continue aspirin and statin. Plavix held for lung biopsy. Resumed today.  Iron deficiency anemia Progressive drop in H&H. Check Hemoccult. Continue iron supplement. Transfuse as needed.  Seizure disorder Continue Dilantin and Keppra. Follows with outpatient neurology.  Tobacco abuse Counseled strongly on cessation. Nicotine patch  Chronic kidney disease stage II Stable.  Low back pain Patient be due to L1 compression fracture seen on imaging. Pain control and PT Add stool softener.    None anion gap Metabolic acidosis Added sodium bicarbonate tablet.     DVT prophylaxis: SCDs Diet: Heart healthy after returning from  Code Status: Full code Family Communication: Wife and daughter at bedside Disposition Plan: Home possibly in 66 hours if blood pressure and hemoglobin stable Need follow-up on lung biopsy and outpatient referral.   Consultants:  [facility]  Cardiology  IR  Procedures:  CT chest/abdomen and pelvis  Lung biopsy  Antibiotics:  IV vancomycin and Zosyn 3/27-3/31  Levaquin until 4/2  HPI/Subjective: Seen and examined. Feels start. Denies shortness of breath.  Objective: Filed Vitals:   06/24/15 1130 06/24/15 1146  BP: 130/65 127/61  Pulse: 96 96  Temp:    Resp:      Intake/Output Summary (Last 24 hours) at 06/24/15 1404 Last data filed at 06/24/15 0657  Gross per 24 hour  Intake     50 ml  Output   1200 ml  Net  -1150 ml   Filed Weights   06/21/15 0327 06/22/15 0215 06/24/15 0603  Weight: 74.8 kg (164 lb 14.5 oz) 77.5 kg (170 lb 13.7 oz) 80.241 kg (176 lb 14.4 oz)    Exam:   General:  Not in  distress, fatigued  HEENT: Pallor present, moist mucosa, supple neck  Chest: Clear bilaterally  Cardiovascular: Normal S1  and S2, no murmurs rub or gallop  GI: Soft, nondistended, nontender, bowel sounds present  Musculoskeletal: Warm, Trace edema  CNS: Alert and oriented  Data Reviewed: Basic Metabolic Panel:  Recent Labs Lab 06/20/15 1926 06/21/15 0428 06/22/15 0342 06/23/15 0420 06/24/15 0345  NA 139 139 141  139 139 140  K 3.9 4.0 4.0  3.9 3.8 3.8  CL 114* 115* 117*  115* 116* 115*  CO2 16* 17* 17*  17* 15* 16*  GLUCOSE 152* 110* 116*  114* 175* 102*  BUN 23* '20 17  18 17 12  '$ CREATININE 1.14 1.02 1.11  1.07 1.03 0.98  CALCIUM 7.8* 7.5* 7.4*  7.4* 7.4* 7.6*  MG  --   --  1.5*  --   --   PHOS  --   --  2.9  --   --    Liver Function Tests:  Recent Labs Lab 06/20/15 1926  AST 68*  ALT 27  ALKPHOS 82  BILITOT 0.3  PROT 6.0*  ALBUMIN 1.9*   No results for input(s): LIPASE, AMYLASE in the last 168 hours. No results for input(s): AMMONIA in the last 168 hours. CBC:  Recent Labs Lab 06/20/15 1926 06/21/15 0428 06/22/15 0342 06/22/15 1230 06/23/15 0420 06/24/15 0345  WBC 23.9* 19.5* 19.8*  --  33.6* 29.3*  NEUTROABS 19.5*  --   --   --   --   --   HGB 7.8* 7.3* 7.1* 8.2* 7.1* 7.5*  HCT 24.6* 24.3* 22.6* 25.6* 22.0* 24.8*  MCV 90.8 91.4 91.1  --  89.8 90.5  PLT 309 276 252  --  263 330   Cardiac Enzymes:  Recent Labs Lab 06/22/15 0342  TROPONINI 0.06*   BNP (last 3 results)  Recent Labs  05/05/15 0522  BNP 226.8*    ProBNP (last 3 results) No results for input(s): PROBNP in the last 8760 hours.  CBG:  Recent Labs Lab 06/22/15 0222  GLUCAP 112*    Recent Results (from the past 240 hour(s))  Culture, blood (x 2)     Status: None (Preliminary result)   Collection Time: 06/20/15  7:35 PM  Result Value Ref Range Status   Specimen Description BLOOD BLOOD LEFT FOREARM  Final   Special Requests BOTTLES DRAWN AEROBIC AND ANAEROBIC 5CC  Final   Culture NO GROWTH 4 DAYS  Final   Report Status PENDING  Incomplete  Culture, blood (x 2)     Status: None  (Preliminary result)   Collection Time: 06/20/15  7:50 PM  Result Value Ref Range Status   Specimen Description BLOOD RIGHT ARM  Final   Special Requests BOTTLES DRAWN AEROBIC AND ANAEROBIC 5CC  Final   Culture NO GROWTH 4 DAYS  Final   Report Status PENDING  Incomplete  Urine culture     Status: None   Collection Time: 06/21/15 12:22 AM  Result Value Ref Range Status   Specimen Description URINE, CLEAN CATCH  Final   Special Requests Normal  Final   Culture 4,000 COLONIES/mL INSIGNIFICANT GROWTH  Final   Report Status 06/22/2015 FINAL  Final  MRSA PCR Screening     Status: None   Collection Time: 06/22/15  2:13 AM  Result Value Ref Range Status   MRSA by PCR NEGATIVE NEGATIVE Final    Comment:        The GeneXpert MRSA Assay (FDA approved for  NASAL specimens only), is one component of a comprehensive MRSA colonization surveillance program. It is not intended to diagnose MRSA infection nor to guide or monitor treatment for MRSA infections.      Studies: Ct Biopsy  06/24/2015  INDICATION: Enlarging posterior right upper lobe mass EXAM: CT-GUIDED BIOPSY LARGE RIGHT UPPER LOBE CENTRALLY NECROTIC MASS MEDICATIONS: 1% LIDOCAINE LOCALLY ANESTHESIA/SEDATION: 1.0 mg IV Versed; 25 mcg IV Fentanyl Moderate Sedation Time:  20 MINUTES The patient was continuously monitored during the procedure by the interventional radiology nurse under my direct supervision. PROCEDURE: The procedure, risks, benefits, and alternatives were explained to the patient. Questions regarding the procedure were encouraged and answered. The patient understands and consents to the procedure. The right posterior back was prepped with ChloraPrep in a sterile fashion, and a sterile drape was applied covering the operative field. A sterile gown and sterile gloves were used for the procedure. Previous imaging reviewed. Patient positioned right side down decubitus. Noncontrast localization CT performed. The large posterior  right upper lobe mass was localized. Under sterile conditions and local anesthesia, a 17 gauge 6.8 cm access needle was advanced from a posterior intercostal approach to the periphery of the right upper lobe mass. Needle position confirmed with CT. 18 gauge core biopsies obtained and placed in formalin. Needle tract embolized with the biosentry device to aide in pneumothorax prevention. Postprocedure imaging demonstrates no complication. Patient tolerated the procedure well without complication. Vital sign monitoring by nursing staff during the procedure will continue as patient is in the special procedures unit for post procedure observation. FINDINGS: The images document guide needle placement within the large posterior right upper lobe mass. Post biopsy images demonstrate no effusion or pneumothorax. COMPLICATIONS: None immediate. IMPRESSION: Successful CT-guided core biopsy of the right upper lobe mass. Electronically Signed   By: Jerilynn Mages.  Shick M.D.   On: 06/24/2015 11:05    Scheduled Meds: . sodium chloride   Intravenous Once  . antiseptic oral rinse  7 mL Mouth Rinse BID  . arformoterol  15 mcg Nebulization BID  . aspirin EC  325 mg Oral Daily  . atorvastatin  80 mg Oral q1800  . bromocriptine  2.5 mg Oral BID  . budesonide (PULMICORT) nebulizer solution  0.5 mg Nebulization BID  . clopidogrel  75 mg Oral Daily  . fentaNYL      . ferrous sulfate  325 mg Oral BID WC  . levETIRAcetam  1,500 mg Oral Q12H  . levofloxacin (LEVAQUIN) IV  750 mg Intravenous Q24H  . lidocaine      . Living Better with Heart Failure Book   Does not apply Once  . midazolam      . nicotine  14 mg Transdermal Daily  . pantoprazole  40 mg Oral BID  . phenytoin  50 mg Oral QHS  . phenytoin  300 mg Oral QHS  . sodium bicarbonate  650 mg Oral BID   Continuous Infusions: . sodium chloride 75 mL/hr at 06/22/15 1950      Time spent: 25 minutes    Louellen Molder  [facility] Pager [phone] If 7PM-7AM,  please contact night-coverage at [email or web address], password [email or web address] 06/24/2015, 2:04 PM  LOS: 2 days

## 2015-06-25 DIAGNOSIS — J9601 Acute respiratory failure with hypoxia: Secondary | ICD-10-CM

## 2015-06-25 DIAGNOSIS — E279 Disorder of adrenal gland, unspecified: Secondary | ICD-10-CM

## 2015-06-25 DIAGNOSIS — A419 Sepsis, unspecified organism: Secondary | ICD-10-CM

## 2015-06-25 DIAGNOSIS — I5031 Acute diastolic (congestive) heart failure: Secondary | ICD-10-CM | POA: Diagnosis present

## 2015-06-25 DIAGNOSIS — J189 Pneumonia, unspecified organism: Secondary | ICD-10-CM

## 2015-06-25 LAB — BASIC METABOLIC PANEL
ANION GAP: 8 (ref 5–15)
BUN: 10 mg/dL (ref 6–20)
CHLORIDE: 114 mmol/L — AB (ref 101–111)
CO2: 18 mmol/L — AB (ref 22–32)
CREATININE: 0.73 mg/dL (ref 0.61–1.24)
Calcium: 7.5 mg/dL — ABNORMAL LOW (ref 8.9–10.3)
GFR calc non Af Amer: >60 mL/min (ref >60)
GLUCOSE: 102 mg/dL — AB (ref 65–99)
Potassium: 3.8 mmol/L (ref 3.5–5.1)
Sodium: 140 mmol/L (ref 135–145)

## 2015-06-25 LAB — CBC
HEMATOCRIT: 22.5 % — AB (ref 39.0–52.0)
Hemoglobin: 7.1 g/dL — ABNORMAL LOW (ref 13.0–17.0)
MCH: 28.2 pg (ref 26.0–34.0)
MCHC: 31.6 g/dL (ref 30.0–36.0)
MCV: 89.3 fL (ref 78.0–100.0)
Platelets: 324 10*3/uL (ref 150–400)
RBC: 2.52 MIL/uL — ABNORMAL LOW (ref 4.22–5.81)
RDW: 16.4 % — ABNORMAL HIGH (ref 11.5–15.5)
WBC: 20.6 10*3/uL — ABNORMAL HIGH (ref 4.0–10.5)

## 2015-06-25 LAB — CULTURE, BLOOD (ROUTINE X 2)
CULTURE: NO GROWTH
Culture: NO GROWTH

## 2015-06-25 MED ORDER — FUROSEMIDE 10 MG/ML IJ SOLN
40.0000 mg | Freq: Once | INTRAMUSCULAR | Status: AC
  Administered 2015-06-25: 40 mg via INTRAVENOUS
  Filled 2015-06-25: qty 4

## 2015-06-25 MED ORDER — DRONABINOL 2.5 MG PO CAPS
2.5000 mg | ORAL_CAPSULE | Freq: Two times a day (BID) | ORAL | Status: DC
  Administered 2015-06-25 – 2015-06-28 (×6): 2.5 mg via ORAL
  Filled 2015-06-25 (×6): qty 1

## 2015-06-25 MED ORDER — POTASSIUM CHLORIDE CRYS ER 10 MEQ PO TBCR
20.0000 meq | EXTENDED_RELEASE_TABLET | Freq: Two times a day (BID) | ORAL | Status: DC
  Administered 2015-06-25 – 2015-06-30 (×11): 20 meq via ORAL
  Filled 2015-06-25 (×20): qty 2

## 2015-06-25 MED ORDER — FUROSEMIDE 10 MG/ML IJ SOLN
40.0000 mg | Freq: Two times a day (BID) | INTRAMUSCULAR | Status: DC
  Administered 2015-06-25 – 2015-06-26 (×2): 40 mg via INTRAVENOUS
  Filled 2015-06-25 (×3): qty 4

## 2015-06-25 NOTE — Progress Notes (Signed)
Patient Name: Cristian Moss Date of Encounter: 06/25/2015  Principal Problem:   Claudication [facility] Active Problems:   Essential hypertension   EMPHYSEMA   Seizure disorder ([place])   Normocytic anemia   Tobacco abuse   Cavitating mass in right upper lung lobe   Coronary artery disease due to lipid rich plaque   Sepsis (HCC)   Dehydration   Low back pain   Chronic obstructive pulmonary disease (HCC)   PAD (peripheral artery disease) (HCC)   Back pain   Hypotension   SIRS (systemic inflammatory response syndrome) (HCC)   CAD in native artery   CHF (congestive heart failure) ([place])   Patient Profile: Jak Haggar is a 70 y.o. male with a history of hypertension, tobacco abuse, and stage  II-III chronic kidney disease. 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 He has a RUL mass which was recently found in Feb. 2017.  He had left heart cath on 05/06/15, he received DES to RCA. He was admitted for LE angiography on 06/20/15.  He had hypotension, leukocytosis, and mildly elevated lactate following the procedure. Found to have cavitating lung mass.      SUBJECTIVE:  Underwent biopsy of lung mass yesterday. Post procedure developed pulmonary edema and resp failure. Improved with IV lasix. Weight still up 10 pounds from baseline.   OBJECTIVE Filed Vitals:   06/25/15 0600 06/25/15 0736 06/25/15 0800 06/25/15 1133  BP:  112/79 122/66 122/66  Pulse:  103 93 103  Temp:  98.4 F (36.9 C)  98.6 F (37 C)  TempSrc:  Oral  Oral  Resp: '20 25 22 20  '$ Height:      Weight:      SpO2: 98% 99% 90% 98%    Intake/Output Summary (Last 24 hours) at 06/25/15 1158 Last data filed at 06/25/15 0600  Gross per 24 hour  Intake    240 ml  Output   2525 ml  Net  -2285 ml   Filed Weights   06/22/15 0215 06/24/15 0603 06/24/15 1920  Weight: 77.5 kg (170 lb 13.7 oz) 80.241 kg (176 lb 14.4 oz) 80.2 kg (176 lb 12.9 oz)     PHYSICAL EXAM General: Cachetic. Frail appearing Head: Normocephalic, atraumatic.  Neck: Supple without bruits, JVP 9-10 Lungs:  + mild EE whhezing Heart: RRR, S1, S2, no S3, S4, or murmur; no rub. Abdomen: Soft, non-tender, non-distended, BS + x 4.  Extremities: No cyanosis, no edema. + clubbing Neuro: Alert and oriented X 3. Moves all extremities spontaneously. Psych: Normal affect.  LABS: CBC:  Recent Labs  06/24/15 0345 06/25/15 0251  WBC 29.3* 20.6*  HGB 7.5* 7.1*  HCT 24.8* 22.5*  MCV 90.5 89.3  PLT 330 324   INR:  Recent Labs  06/24/15 0345  INR 4.96   Basic Metabolic Panel:  Recent Labs  06/24/15 0345 06/25/15 0251  NA 140 140  K 3.8 3.8  CL 115* 114*  CO2 16* 18*  GLUCOSE 102* 102*  BUN 12 10  CREATININE 0.98 0.73  CALCIUM 7.6* 7.5*   Cardiac Enzymes:No results for input(s): CKTOTAL, CKMB, CKMBINDEX, TROPONINI in the last 72 hours.  Thyroid Function Tests:No results for input(s): TSH, T4TOTAL, T3FREE, THYROIDAB in the last 72 hours.  Invalid input(s): FREET3   Current facility-administered medications:  .  0.9 %  sodium chloride infusion, , Intravenous, Once, Minus Breeding, MD .  acetaminophen (TYLENOL) tablet 975  mg, 975 mg, Oral, Q6H PRN, Minus Breeding, MD, 975 mg at 06/22/15 1942 .  albuterol (PROVENTIL) (2.5 MG/3ML) 0.083% nebulizer solution 2.5 mg, 2.5 mg, Inhalation, Q3H PRN, Rahul P Desai, PA-C, 2.5 mg at 06/25/15 0326 .  antiseptic oral rinse (CPC / CETYLPYRIDINIUM CHLORIDE 0.05%) solution 7 mL, 7 mL, Mouth Rinse, BID, Almyra Deforest, PA, 7 mL at 06/25/15 1000 .  arformoterol (BROVANA) nebulizer solution 15 mcg, 15 mcg, Nebulization, BID, Rahul P Desai, PA-C, 15 mcg at 06/24/15 1942 .  aspirin EC tablet 81 mg, 81 mg, Oral, Daily, Troy Sine, MD, 81 mg at 06/25/15 1018 .  atorvastatin (LIPITOR) tablet 80 mg, 80 mg, Oral, q1800, Lorretta Harp, MD, 80 mg at 06/24/15 1546 .  bisacodyl (DULCOLAX) EC tablet 5 mg, 5 mg, Oral, Daily PRN,  Lorretta Harp, MD, 5 mg at 06/24/15 1545 .  bromocriptine (PARLODEL) tablet 2.5 mg, 2.5 mg, Oral, BID, Lorretta Harp, MD, 2.5 mg at 06/25/15 1018 .  budesonide (PULMICORT) nebulizer solution 0.5 mg, 0.5 mg, Nebulization, BID, Rahul P Desai, PA-C, 0.5 mg at 06/24/15 1942 .  clopidogrel (PLAVIX) tablet 75 mg, 75 mg, Oral, Daily, Arbutus Leas, NP, 75 mg at 06/25/15 1018 .  fentaNYL (SUBLIMAZE) injection 25-50 mcg, 25-50 mcg, Intravenous, Q4H PRN, Nishant Dhungel, MD, 50 mcg at 06/25/15 0809 .  ferrous sulfate tablet 325 mg, 325 mg, Oral, BID WC, Lorretta Harp, MD, 325 mg at 06/25/15 0803 .  folic acid (FOLVITE) tablet 1 mg, 1 mg, Oral, Daily, Troy Sine, MD, 1 mg at 06/25/15 1018 .  levETIRAcetam (KEPPRA) tablet 1,500 mg, 1,500 mg, Oral, Q12H, Lorretta Harp, MD, 1,500 mg at 06/25/15 1018 .  levofloxacin (LEVAQUIN) IVPB 750 mg, 750 mg, Intravenous, Q24H, Nishant Dhungel, MD, 750 mg at 06/24/15 1542 .  Living Better with Heart Failure Book, , Does not apply, Once, Lorretta Harp, MD .  morphine 2 MG/ML injection 2 mg, 2 mg, Intravenous, Once, Louellen Molder, MD, Stopped at 06/24/15 1530 .  nicotine (NICODERM CQ - dosed in mg/24 hours) patch 14 mg, 14 mg, Transdermal, Daily, Lorretta Harp, MD, 14 mg at 06/25/15 1019 .  nitroGLYCERIN (NITROSTAT) SL tablet 0.4 mg, 0.4 mg, Sublingual, Q5 min PRN, Lorretta Harp, MD .  ondansetron [facility]) injection 4 mg, 4 mg, Intravenous, Q6H PRN, Lorretta Harp, MD, 4 mg at 06/20/15 1445 .  oxyCODONE (Oxy IR/ROXICODONE) immediate release tablet 5 mg, 5 mg, Oral, Q6H PRN, Lorretta Harp, MD, 5 mg at 06/25/15 1017 .  pantoprazole (PROTONIX) EC tablet 40 mg, 40 mg, Oral, BID, Lorretta Harp, MD, 40 mg at 06/25/15 1017 .  phenytoin (DILANTIN) chewable tablet 50 mg, 50 mg, Oral, QHS, Lorretta Harp, MD, 50 mg at 06/24/15 2220 .  phenytoin (DILANTIN) ER capsule 300 mg, 300 mg, Oral, QHS, Lorretta Harp, MD, 300 mg at 06/24/15 2121 .   polyethylene glycol (MIRALAX / GLYCOLAX) packet 17 g, 17 g, Oral, Daily, Nishant Dhungel, MD, 17 g at 06/25/15 1020 .  sodium bicarbonate tablet 650 mg, 650 mg, Oral, BID, Nishant Dhungel, MD, 650 mg at 06/25/15 1019    TELE:   NSR      Radiology/Studies: Ct Biopsy  06/24/2015  INDICATION: Enlarging posterior right upper lobe mass EXAM: CT-GUIDED BIOPSY LARGE RIGHT UPPER LOBE CENTRALLY NECROTIC MASS MEDICATIONS: 1% LIDOCAINE LOCALLY ANESTHESIA/SEDATION: 1.0 mg IV Versed; 25 mcg IV Fentanyl Moderate Sedation Time:  20 MINUTES The patient was continuously monitored  during the procedure by the interventional radiology nurse under my direct supervision. PROCEDURE: The procedure, risks, benefits, and alternatives were explained to the patient. Questions regarding the procedure were encouraged and answered. The patient understands and consents to the procedure. The right posterior back was prepped with ChloraPrep in a sterile fashion, and a sterile drape was applied covering the operative field. A sterile gown and sterile gloves were used for the procedure. Previous imaging reviewed. Patient positioned right side down decubitus. Noncontrast localization CT performed. The large posterior right upper lobe mass was localized. Under sterile conditions and local anesthesia, a 17 gauge 6.8 cm access needle was advanced from a posterior intercostal approach to the periphery of the right upper lobe mass. Needle position confirmed with CT. 18 gauge core biopsies obtained and placed in formalin. Needle tract embolized with the biosentry device to aide in pneumothorax prevention. Postprocedure imaging demonstrates no complication. Patient tolerated the procedure well without complication. Vital sign monitoring by nursing staff during the procedure will continue as patient is in the special procedures unit for post procedure observation. FINDINGS: The images document guide needle placement within the large posterior right  upper lobe mass. Post biopsy images demonstrate no effusion or pneumothorax. COMPLICATIONS: None immediate. IMPRESSION: Successful CT-guided core biopsy of the right upper lobe mass. Electronically Signed   By: Jerilynn Mages.  Shick M.D.   On: 06/24/2015 11:05   Dg Chest Port 1 View  06/24/2015  CLINICAL DATA:  Cavitating right upper lobe lung mass. EXAM: PORTABLE CHEST 1 VIEW COMPARISON:  06/22/2015 and CT 06/08/2015 FINDINGS: Examination demonstrates adequate lung volumes with no significant change in patient's known large right lung mass which has undergone recent percutaneous biopsy. There is less interstitial density adjacent to the mass. No significant effusion. No evidence of pneumothorax. Cardiomediastinal silhouette and remainder of the exam is unchanged. IMPRESSION: Stable known large right upper lobe lung mass post recent percutaneous biopsy. Near resolution of previously seen heterogeneous density adjacent the mass. Electronically Signed   By: Marin Olp M.D.   On: 06/24/2015 14:03   Dg Chest Port 1v Same Day  06/24/2015  CLINICAL DATA:  Congestive heart failure. Increase shortness of breath. Right lung mass. Biopsy earlier today. EXAM: PORTABLE CHEST 1 VIEW COMPARISON:  Plain films, including earlier today at 1349 hours. FINDINGS: Patient rotated right. Normal heart size with a tortuous thoracic aorta. No pleural effusion or pneumothorax. Right upper lobe lung mass again identified. There is patchy interstitial and airspace disease within the right upper, right lower, and left lower lobes. New. IMPRESSION: Multifocal interstitial and airspace disease, worse on the right. This is superimposed upon a known right upper lobe lung mass. Given time course of development, favored to represent pulmonary edema. Aspiration pneumonitis could have a similar appearance. No pneumothorax or other acute complication of biopsy. The right upper lobe opacity is felt unlikely to represent biopsy induced hemorrhage, given  development of multi focal airspace disease. Electronically Signed   By: Abigail Miyamoto M.D.   On: 06/24/2015 19:12     Current Medications:  . sodium chloride   Intravenous Once  . antiseptic oral rinse  7 mL Mouth Rinse BID  . arformoterol  15 mcg Nebulization BID  . aspirin EC  81 mg Oral Daily  . atorvastatin  80 mg Oral q1800  . bromocriptine  2.5 mg Oral BID  . budesonide (PULMICORT) nebulizer solution  0.5 mg Nebulization BID  . clopidogrel  75 mg Oral Daily  . ferrous sulfate  325  mg Oral BID WC  . folic acid  1 mg Oral Daily  . levETIRAcetam  1,500 mg Oral Q12H  . levofloxacin (LEVAQUIN) IV  750 mg Intravenous Q24H  . Living Better with Heart Failure Book   Does not apply Once  .  morphine injection  2 mg Intravenous Once  . nicotine  14 mg Transdermal Daily  . pantoprazole  40 mg Oral BID  . phenytoin  50 mg Oral QHS  . phenytoin  300 mg Oral QHS  . polyethylene glycol  17 g Oral Daily  . sodium bicarbonate  650 mg Oral BID      ASSESSMENT AND PLAN: Principal Problem:   Claudication [facility]) Active Problems:   Essential hypertension   EMPHYSEMA   Seizure disorder (HCC)   Normocytic anemia   Tobacco abuse   Cavitating mass in right upper lung lobe   Coronary artery disease due to lipid rich plaque   Sepsis (HCC)   Dehydration   Low back pain   Chronic obstructive pulmonary disease (HCC)   PAD (peripheral artery disease) (HCC)   Back pain   Hypotension   SIRS (systemic inflammatory response syndrome) (HCC)   CAD in native artery   CHF (congestive heart failure) ([place])  1. PAD: - doppler 05/31/2015 right ABI of 0.72 with high grade disease in his R SFA and L ABI 0.59 with occluded L  SFA - LE angiography 06/20/2015 [facility] 1 directional atherectomy with drug eluding balloon angioplasty R SFA  2. SIRS with leukocytosis:         --improved. IM managing  3. Cavitating mass in RUL, note to have large 8.9x6.8 xm lung mass in posterior R upper lobe: - s/p  biopsy 3/31. Back on Plavix  - path pending. 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 Acute hypoxic respiratory failure post procedure 3/31 due to acute diastolic HF -improved with diuresis/Bipap but still overloaded. Continue lasix 40 IV bid -EF 45-50% by echo 2/17  5. CAD s/p DES to RCA: - cath 05/06/2015 with 80% prox RCA treated with DES, 65% 1st Marginal treated medically.  - echo 05/04/2015 EF 45-50%, hypokinesis of apicalanteroseptal myocardium, PA peak pressure 4mHg  6. Worsening anemia:  - suspect possibly due to his malignancy. Ct negative for bleed.     - started on po iron  7. L adrenal mass: recent CT of chest 3/15, 7.1x6.1 cm large L adrenal mass increased in size, suspicious for metastasis from lung mass   Bensimhon, Daniel,MD 12:00 PM

## 2015-06-25 NOTE — Progress Notes (Signed)
Pt called out with SOB, difficulty breathing. Respiratory called, neb treatment given, no improvement. Sats 83-86%. Placed pt on venturi mask. Explained to pt he needed to wear BiPap, pt refused. MD notified, lasix orders given. RN informed pt that if his O2 didn't improve and he still refused BiPap he would have to be intubated. Pt stated he would rather get intubated than wear BiPap even after clear explanation of what intubation entailed. 30 min after administering lasix and being on venturi mask, pt O2 sat improved to 93%. Pt states he feels better and SOB is gone. Will continue to monitor closely.

## 2015-06-25 NOTE — Evaluation (Signed)
Physical Therapy Evaluation Patient Details Name: Cristian Moss MRN: [id number] DOB: 18-May-1945 Today's Date: 06/25/2015   History of Present Illness  Patient is a 70 yo male admitted 06/20/15 with claudication and is s/p Rt LE angiography, angioplasty Rt SFA.  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  CT showed L1 fracture.     PMH:  HTN, tobacco use, CKD, seizures, CAD, cardiac stent, DOE    Clinical Impression  Patient presents with problems listed below.  Will benefit from acute PT to maximize functional mobility prior to discharge.  Patient independent pta, and today required +2 max-total assist for mobility.  Recommend SNF at discharge for continued therapy prior to return home with wife.    Follow Up Recommendations SNF;Supervision/Assistance - 24 hour    Equipment Recommendations  None recommended by PT    Recommendations for Other Services       Precautions / Restrictions Precautions Precautions: Fall Restrictions Weight Bearing Restrictions: No      Mobility  Bed Mobility Overal bed mobility: Needs Assistance;+2 for physical assistance Bed Mobility: Supine to Sit;Sit to Supine     Supine to sit: Max assist;+2 for physical assistance;HOB elevated Sit to supine: Total assist;+2 for physical assistance   General bed mobility comments: Verbal cues to move to EOB.  Patient reports "I can't move my legs".  Patient able to move LE's toward EOB with min assist.  Patient required +2 max assist to raise trunk to sitting and scoot to EOB in sitting.  Once upright, required max assist to maintain static sitting balance.  Patient leaning posteriorly.  Attempted to have patient shift weight forward to help with balance.  After 2 minutes, patient states "I can't do this" and asked to lay down.  Required +2 total assist to move to supine.  Patient unable to state whether limiting factor was pain, lightheadedness, etc.  Transfers                  General transfer comment: NT  Ambulation/Gait                Stairs            Wheelchair Mobility    Modified Rankin (Stroke Patients Only)       Balance Overall balance assessment: Needs assistance Sitting-balance support: Bilateral upper extremity supported;Feet supported Sitting balance-Leahy Scale: Zero Sitting balance - Comments: Requires max assist to maintain balance. Postural control: Posterior lean                                   Pertinent Vitals/Pain Pain Assessment: Faces Faces Pain Scale: Hurts whole lot Pain Location: RLE Pain Descriptors / Indicators: Aching;Sore Pain Intervention(s): Monitored during session;Repositioned    Home Living Family/patient expects to be discharged to:: Private residence Living Arrangements: Spouse/significant other Available Help at Discharge: Family;Available 24 hours/day Type of Home: House Home Access: Stairs to enter Entrance Stairs-Rails: Left;Right;Can reach both Entrance Stairs-Number of Steps: 2 Home Layout: Two level Home Equipment: Walker - 2 wheels;Cane - single point;Bedside commode;Shower seat      Prior Function Level of Independence: Independent         Comments: Independent with all mobility and ADL's.  Does not drive.     Hand Dominance   Dominant Hand: Right    Extremity/Trunk Assessment   Upper Extremity Assessment: Overall WFL for tasks assessed  Lower Extremity Assessment: Generalized weakness;LLE deficits/detail;RLE deficits/detail RLE Deficits / Details: Strength grossly 3-/5 - pain limiting assessment LLE Deficits / Details: Strength grossly 3-/5     Communication   Communication: No difficulties  Cognition Arousal/Alertness: Awake/alert Behavior During Therapy: Flat affect Overall Cognitive Status: Within Functional Limits for tasks assessed                      General Comments      Exercises         Assessment/Plan    PT Assessment Patient needs continued PT services  PT Diagnosis Difficulty walking;Generalized weakness;Acute pain   PT Problem List Decreased strength;Decreased activity tolerance;Decreased balance;Decreased mobility;Decreased knowledge of use of DME;Decreased safety awareness;Cardiopulmonary status limiting activity;Pain  PT Treatment Interventions DME instruction;Gait training;Functional mobility training;Therapeutic activities;Therapeutic exercise;Balance training;Patient/family education   PT Goals (Current goals can be found in the Care Plan section) Acute Rehab PT Goals Patient Stated Goal: None stated PT Goal Formulation: With patient Time For Goal Achievement: 07/15/15 Potential to Achieve Goals: Fair    Frequency Min 3X/week   Barriers to discharge        Co-evaluation               End of Session Equipment Utilized During Treatment: Oxygen Activity Tolerance: Patient limited by fatigue;Patient limited by pain Patient left: in bed;with call bell/phone within reach;with bed alarm set Nurse Communication: Mobility status         Time: 8546-2703 PT Time Calculation (min) (ACUTE ONLY): 11 min   Charges:   PT Evaluation $PT Eval High Complexity: 1 Procedure     PT G CodesDespina Pole Jul 01, 2015, 2:02 PM Carita Pian. Sanjuana Kava, [place] Pager [phone]

## 2015-06-25 NOTE — Progress Notes (Addendum)
Progress Note   Cristian Moss TJQ:[id number] DOB: 14-Apr-1945 DOA: 06/20/2015 PCP: [name], MD   Brief Narrative:   Cristian Moss is an 70 y.o. male recently admitted with seizures and found to have right upper lobe mass, severe peripheral artery disease admitted for right angiography on 06/20/2015 with atherectomy and drug-eluting balloon angioplasty of right SFA. Postoperatively patient became hypertensive and had leukocytosis with elevated lactate of 2. Placed on empiric antibiotic and given IV hydration. On the evening of 3/28 hemoglobin dropped to 7.3 and given 1 unit PRBC. While being transfused he had low-grade fever and subsequently became persistently hypotensive, was transferred to ICU. Patient was placed on IV hydrocortisone and given IV fluid resuscitation, improved subsequently and transferred to telemetry. On 06/24/15, the patient was transferred back to the SDU secondary to acute hypoxic respiratory failure requiring BiPAP.  Assessment/Plan:   Sepsis with hypotension with hypovolemia/dehydration status post procedure (abdominal aortogram with bifemoral runoff and angioplasty of the right SFA)/probable postobstructive pneumonia Initial concern was for infection (lactic acid and Pro calcitonin both elevated on admission), but cultures have remained negative. The patient has improved on empiric antibiotics (status post 5 days of Zosyn/vancomycin, now on Levaquin) to treat a possible postobstructive pneumonia given right upper lobe mass, stress dose steroids and IV fluids. He remains off blood pressure medications. Cortisol was within normal limits at 12.8. Stress dose steroids have now been weaned and the patient's blood pressure has remained stable. We'll continue Levaquin through 06/26/15.  Cavitating mass in right upper lung lobe suspicious for malignancy Large cavitating RUL mass measuring 8.96.8cm seen on CT scan 05/04/15 which is suspicious for malignancy. History of  >40-pack-year smoking. Status post CT-guided biopsy 06/24/15. Results pending.  Large left adrenal mass (8cm) Needs PET-CT as outpatient. Suspicion for malignancy/ ? Lung metastases. Mass has increased in size on recent CT compared to one month back. Needs oncology consult depending upon biopsy results.  Acute on chronic hypoxic respiratory failure/acute diastolic CHF Multifactorial. Initially likely from sepsis, acute diastolic heart failure related to volume overload in the setting for need for fluid volume resuscitation. Developed acute respiratory failure overnight 06/24/15 and was transferred back to the stepdown unit placed on BiPAP. He was diuresed further and was weaned back to nasal cannula oxygen. He remains volume overloaded on Lasix 40 mg twice a day. EF 45-50 percent by echocardiogram done 04/2015. Cardiology following.  COPD Continue Brovana and budesonide. Continue albuterol as needed for wheezing.  Severe peripheral artery disease High-grade disease a SFA status post angiography, atherectomy with drug-eluting balloon angioplasty of R SFA.  CAD with recent cardiac cath with 80% proximal RCA stenosis treated with DES/elevated troponin Continue aspirin and statin. Plavix held for lung biopsy. Resumed 06/24/15. Had mild elevation of troponin 1. Cardiology following.  Iron deficiency anemia Status post 2 units of PRBCs post procedure. Progressive drop in H&H. Check Hemoccult. Continue iron supplement. Transfuse as needed.  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 Follows with outpatient neurology.  Tobacco abuse Counseled strongly on cessation. Nicotine patch.  Chronic kidney disease stage III Stable.  Low back pain Patient be due to L1 compression fracture seen on imaging. Pain control and PT ordered. Bowel regimen started.  Non anion gap Metabolic acidosis Continue sodium bicarbonate tablet.  Fatigue and protein  calorie malnutrition, likely severe Patient is very weak and has had a poor appetite for a long time. He is losing a significant amount of weight and  has muscle wasting. We'll start him on Marinol for appetite stimulation and obtain dietitian consultation for further recommendations.   Family Communication/Anticipated D/C date and plan/Code Status   Family Communication: Wife and daughter at the bedside. Disposition Plan/date: From home, but is significantly deconditioned and will likely need SNF. Awaiting final biopsy results and medical stability prior to discharge. With need for BiPAP over the last 24 hours, still not stable yet. Code Status: Full code.   Procedures and diagnostic studies:   Ct Abdomen Pelvis Wo Contrast  06/21/2015  CLINICAL DATA:  Back pain. Catheterization yesterday. Evaluate for retroperitoneal hematoma. EXAM: CT ABDOMEN AND PELVIS WITHOUT CONTRAST TECHNIQUE: Multidetector CT imaging of the abdomen and pelvis was performed following the standard protocol without IV contrast. COMPARISON:  05/05/2015 FINDINGS: Lower chest and abdominal wall: Fatty enlargement of the bilateral inguinal canal. There is a small right pleural effusion which is newly seen recent chest CT, with atelectatic type opacity in the right lower lobe. Hepatobiliary: Low-density foci best in the liver seen on prior enhanced study are not visible by this technique.Cholelithiasis. No signs of obstruction or inflammation. Pancreas: There is a 6 mm low-density mass in the uncinate process, doubtful clinical significance given the lung and left adrenal findings. This will be followed with the adrenal mass. Spleen: Unremarkable. Adrenals/Urinary Tract: Continued enlargement of a left adrenal mass which has cystic density but mural complexity on prior enhanced CT. Mass measures up to 85 mm today. Cortical retention and continued excretion of IV contrast from catheterization yesterday. Although initially concerning for  ATN there is stable creatinine clearance. No hydronephrosis. Negative bladder. Reproductive:No pathologic findings. Stomach/Bowel: Suspect proximal gastric diverticulum containing high-density material. Prominent volume of formed stool. No obstruction. No appendicitis. Vascular/Lymphatic: No retroperitoneal hematoma. Diffuse atherosclerosis No mass or adenopathy. Peritoneal: No ascites or pneumoperitoneum. Musculoskeletal: L1 superior endplate fracture with mild depression, stable from chest CT 06/08/2015. No visualized osseous metastasis. Chronic L5 right pars defect. IMPRESSION: 1. Negative for retroperitoneal hematoma. 2. Trace right pleural effusion with lower lobe atelectasis. 3. Continued growth of the cystic left adrenal mass, now ~8 cm, metastasis favored. 4. L1 superior endplate fracture with stable depression since 06/08/2015. 5. Probable developing constipation. Electronically Signed   By: Monte Fantasia M.D.   On: 06/21/2015 12:54   Dg Lumbar Spine Complete  06/08/2015  CLINICAL DATA:  Low back pain for several days.  No injury. EXAM: LUMBAR SPINE - COMPLETE 4+ VIEW COMPARISON:  CT abdomen and pelvis 05/05/2015 FINDINGS: There are 5 non rib-bearing lumbar type vertebral bodies. Vertebral alignment is normal. Vertebral body heights are preserved. There is minimal disc space narrowing at L5-S1, with vacuum disc phenomenon noted on the prior CT. Disc space heights are preserved elsewhere. A right L5 pars defect is noted. Atherosclerotic aortic calcification is noted. IMPRESSION: 1. No acute osseous abnormality identified. 2. 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 Minimal L5-S1 disc narrowing. Electronically Signed   By: Logan Bores M.D.   On: 06/08/2015 13:07   Ct Chest Wo Contrast  06/08/2015  CLINICAL DATA:  70 year old male with cavitary right upper lobe lung mass presenting for follow-up status post antibiotic therapy. EXAM: CT CHEST WITHOUT CONTRAST TECHNIQUE: Multidetector CT imaging of the  chest was performed following the standard protocol without IV contrast. COMPARISON:  05/04/2015 chest CT . FINDINGS: Mediastinum/Nodes: Normal heart size. No pericardial fluid/thickening. Extensive coronary atherosclerosis. Atherosclerotic nonaneurysmal thoracic aorta. Stable dilated main pulmonary artery (3.1 cm diameter). Normal visualized thyroid. Normal esophagus. No axillary adenopathy. No  pathologically enlarged mediastinal lymph nodes. Stable mildly enlarged 1.4 cm right hilar node (series 2/ image 29). No gross left hilar adenopathy on this noncontrast study. Lungs/Pleura: No pneumothorax. No pleural effusion. There are layering inspissated secretions throughout the lower trachea and bilateral mainstem bronchi. There is mild centrilobular emphysema and diffuse bronchial wall thickening. There is a large 8.9 x 6.8 cm lung mass in the posterior right upper lobe (series 3/ image 25), which abuts the visceral pleural and right major fissure, previously measuring 7.6 x 5.8 cm on 05/04/2015, significantly increased in size. Please note that the mass significantly distorts the superior aspect of the right major fissure in the mass may traverse the fissure into the superior segment of the right lower lobe. There is patchy consolidation, ground-glass opacity and reticulation throughout the dependent and basilar right upper lobe surrounding the right upper lobe mass, not definitely changed. Solid 1.2 cm lingular pulmonary nodule (series 3/image 35), previously 1.1 cm on 05/04/2015 and 0.8 cm on 01/17/2009, not definitely changed in the interval. New atelectasis in the posterior lingula. No new significant pulmonary nodules. Upper abdomen: Partial visualization of large left adrenal mass measuring at least 7.1 x 6.1 cm, previously 6.3 x 5.8 cm, increased in size. Musculoskeletal: There is new lucency and irregularity of the visualize superior endplate of the L1 vertebral body, cannot exclude L1 fracture. IMPRESSION:  1. 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 2. No appreciable change in nonspecific patchy consolidation, ground-glass opacity and reticulation throughout the posterior/basilar right upper lobe surrounding the lung mass, which could represent alveolar hemorrhage and/or lymphangitic tumor spread. 3. Solid 1.2 cm lingular pulmonary nodule, slowly growing since 2010, indeterminate. 4. Stable mild right hilar lymphadenopathy, suspicious for nodal metastasis. No mediastinal lymphadenopathy. 5. 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 6. Recommend further evaluation of these findings with PET-CT. 7. New lucency and irregularity of the visualized superior endplate of the L1 vertebral body, cannot exclude L1 vertebral fracture. Consider correlation with lumbar spine MRI as clinically warranted. 8. Additional findings include coronary atherosclerosis, chronic main pulmonary artery dilation suggesting pulmonary arterial hypertension and mild emphysema. Electronically Signed   By: Ilona Sorrel M.D.   On: 06/08/2015 13:03   Ct Biopsy  06/24/2015  INDICATION: Enlarging posterior right upper lobe mass EXAM: CT-GUIDED BIOPSY LARGE RIGHT UPPER LOBE CENTRALLY NECROTIC MASS MEDICATIONS: 1% LIDOCAINE LOCALLY ANESTHESIA/SEDATION: 1.0 mg IV Versed; 25 mcg IV Fentanyl Moderate Sedation Time:  20 MINUTES The patient was continuously monitored during the procedure by the interventional radiology nurse under my direct supervision. PROCEDURE: The procedure, risks, benefits, and alternatives were explained to the patient. Questions regarding the procedure were encouraged and answered. The patient understands and consents to the  procedure. The right posterior back was prepped with ChloraPrep in a sterile fashion, and a sterile drape was applied covering the operative field. A sterile gown and sterile gloves were used for the procedure. Previous imaging reviewed. Patient positioned right side down decubitus. Noncontrast localization CT performed. The large posterior right upper lobe mass was localized. Under sterile conditions and local anesthesia, a 17 gauge 6.8 cm access needle was advanced from a posterior intercostal approach to the periphery of the right upper lobe mass. Needle position confirmed with CT. 18  gauge core biopsies obtained and placed in formalin. Needle tract embolized with the biosentry device to aide in pneumothorax prevention. Postprocedure imaging demonstrates no complication. Patient tolerated the procedure well without complication. Vital sign monitoring by nursing staff during the procedure will continue as patient is in the special procedures unit for post procedure observation. FINDINGS: The images document guide needle placement within the large posterior right upper lobe mass. Post biopsy images demonstrate no effusion or pneumothorax. COMPLICATIONS: None immediate. IMPRESSION: Successful CT-guided core biopsy of the right upper lobe mass. Electronically Signed   By: Jerilynn Mages.  Shick M.D.   On: 06/24/2015 11:05   Dg Chest Port 1 View  06/24/2015  CLINICAL DATA:  Cavitating right upper lobe lung mass. EXAM: PORTABLE CHEST 1 VIEW COMPARISON:  06/22/2015 and CT 06/08/2015 FINDINGS: Examination demonstrates adequate lung volumes with no significant change in patient's known large right lung mass which has undergone recent percutaneous biopsy. There is less interstitial density adjacent to the mass. No significant effusion. No evidence of pneumothorax. Cardiomediastinal silhouette and remainder of the exam is unchanged. IMPRESSION: Stable known large right upper lobe lung mass post recent percutaneous biopsy. Near  resolution of previously seen heterogeneous density adjacent the mass. Electronically Signed   By: Marin Olp M.D.   On: 06/24/2015 14:03   Dg Chest Port 1 View  06/22/2015  CLINICAL DATA:  Respiratory failure . EXAM: PORTABLE CHEST 1 VIEW COMPARISON:  06/20/2015.  CT 06/08/2015. FINDINGS: Mediastinum is stable. Heart size normal. Right upper lobe infiltrate/mass again noted. Again this could represent a pulmonary abscess or neoplasm. Improvement of right upper lobe atelectasis prior exam. Mild bibasilar atelectasis. No pleural effusion or pneumothorax . IMPRESSION: 1. Persistent rounded infiltrate/mass right upper lobe. Again this could represent a pulmonary abscess or neoplasm. Improvement of right upper lobe atelectasis noted on today's exam. 2. Mild bibasilar atelectasis. Electronically Signed   By: Marcello Moores  Register   On: 06/22/2015 07:14   Dg Chest Port 1 View  06/20/2015  CLINICAL DATA:  Tachycardia, chills, elevated white blood cells EXAM: PORTABLE CHEST 1 VIEW COMPARISON:  06/08/2015 FINDINGS: There is right upper lobe consolidation. There is no pleural effusion or pneumothorax. The heart and mediastinal contours are unremarkable. The osseous structures are unremarkable. IMPRESSION: Right upper lobe consolidation most concerning for pneumonia. Given the masslike area in the right upper lobe on previous CT of the chest dated 06/09/2015 this is concerning for possible intrapulmonary abscess. Electronically Signed   By: Kathreen Devoid   On: 06/20/2015 16:31   Dg Chest Port 1v Same Day  06/24/2015  CLINICAL DATA:  Congestive heart failure. Increase shortness of breath. Right lung mass. Biopsy earlier today. EXAM: PORTABLE CHEST 1 VIEW COMPARISON:  Plain films, including earlier today at 1349 hours. FINDINGS: Patient rotated right. Normal heart size with a tortuous thoracic aorta. No pleural effusion or pneumothorax. Right upper lobe lung mass again identified. There is patchy interstitial and airspace  disease within the right upper, right lower, and left lower lobes. New. IMPRESSION: Multifocal interstitial and airspace disease, worse on the right. This is superimposed upon a known right upper lobe lung mass. Given time course of development, favored to represent pulmonary edema. Aspiration pneumonitis could have a similar appearance. No pneumothorax or other acute complication of biopsy. The right upper lobe opacity is felt unlikely to represent biopsy induced hemorrhage, given development of multi focal airspace disease. Electronically Signed   By: Abigail Miyamoto M.D.   On: 06/24/2015 19:12    Medical  Consultants:    Pulmonology  Interventional Radiology  Anti-Infectives:   Levaquin 06/24/15---> Vancomycin/Zosyn 06/20/15---> 06/24/15   Subjective:   Cristian Moss had significant back pain as well as acute shortness of breath requiring BiPAP overnight. His wife and his daughter are at the bedside, and they say that he has been very weak and can barely move his lower extremities. 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  Objective:    Filed Vitals:   06/25/15 0400 06/25/15 0430 06/25/15 0512 06/25/15 0600  BP: 130/80     Pulse:      Temp:      TempSrc:      Resp: '24 20 23 20  '$ Height:      Weight:      SpO2:  93% 93% 98%    Intake/Output Summary (Last 24 hours) at 06/25/15 0716 Last data filed at 06/25/15 0600  Gross per 24 hour  Intake    240 ml  Output   2525 ml  Net  -2285 ml   Filed Weights   06/22/15 0215 06/24/15 0603 06/24/15 1920  Weight: 77.5 kg (170 lb 13.7 oz) 80.241 kg (176 lb 14.4 oz) 80.2 kg (176 lb 12.9 oz)    Exam: Gen:  Frail, elderly with weakness apparent Cardiovascular:  RRR, No M/R/G Respiratory:  Lungs with some mild expiratory wheezes Gastrointestinal:  Abdomen soft, NT/ND, + BS Extremities:  Clubbing present, no edema   Data Reviewed:    Labs: Basic Metabolic Panel:  Recent Labs Lab  06/21/15 0428 06/22/15 0342 06/23/15 0420 06/24/15 0345 06/25/15 0251  NA 139 141  139 139 140 140  K 4.0 4.0  3.9 3.8 3.8 3.8  CL 115* 117*  115* 116* 115* 114*  CO2 17* 17*  17* 15* 16* 18*  GLUCOSE 110* 116*  114* 175* 102* 102*  BUN '20 17  18 17 12 10  '$ CREATININE 1.02 1.11  1.07 1.03 0.98 0.73  CALCIUM 7.5* 7.4*  7.4* 7.4* 7.6* 7.5*  MG  --  1.5*  --   --   --   PHOS  --  2.9  --   --   --    GFR Estimated Creatinine Clearance: 87.4 mL/min (by C-G formula based on Cr of 0.73). Liver Function Tests:  Recent Labs Lab 06/20/15 1926  AST 68*  ALT 27  ALKPHOS 82  BILITOT 0.3  PROT 6.0*  ALBUMIN 1.9*   Coagulation profile  Recent Labs Lab 06/20/15 0649 06/20/15 1926 06/24/15 0345  INR 1.32 1.58* 1.44    CBC:  Recent Labs Lab 06/20/15 1926 06/21/15 0428 06/22/15 0342 06/22/15 1230 06/23/15 0420 06/24/15 0345 06/25/15 0251  WBC 23.9* 19.5* 19.8*  --  33.6* 29.3* 20.6*  NEUTROABS 19.5*  --   --   --   --   --   --   HGB 7.8* 7.3* 7.1* 8.2* 7.1* 7.5* 7.1*  HCT 24.6* 24.3* 22.6* 25.6* 22.0* 24.8* 22.5*  MCV 90.8 91.4 91.1  --  89.8 90.5 89.3  PLT 309 276 252  --  263 330 324   Cardiac Enzymes:  Recent Labs Lab 06/22/15 0342  TROPONINI 0.06*   CBG:  Recent Labs Lab 06/22/15 0222  GLUCAP 112*   Sepsis Labs:  Recent Labs Lab 06/20/15 1926 06/20/15 2154  06/22/15 0342 06/23/15 0420 06/24/15 0345 06/25/15 0251  PROCALCITON 1.27  --   --  0.75 0.59 0.46  --   WBC 23.9*  --   < >  19.8* 33.6* 29.3* 20.6*  LATICACIDVEN 2.2* 2.0  --  0.8  --   --   --   < > = values in this interval not displayed. Microbiology Recent Results (from the past 240 hour(s))  Culture, blood (x 2)     Status: None (Preliminary result)   Collection Time: 06/20/15  7:35 PM  Result Value Ref Range Status   Specimen Description BLOOD BLOOD LEFT FOREARM  Final   Special Requests BOTTLES DRAWN AEROBIC AND ANAEROBIC 5CC  Final   Culture NO GROWTH 4 DAYS  Final    Report Status PENDING  Incomplete  Culture, blood (x 2)     Status: None (Preliminary result)   Collection Time: 06/20/15  7:50 PM  Result Value Ref Range Status   Specimen Description BLOOD RIGHT ARM  Final   Special Requests BOTTLES DRAWN AEROBIC AND ANAEROBIC 5CC  Final   Culture NO GROWTH 4 DAYS  Final   Report Status PENDING  Incomplete  Urine culture     Status: None   Collection Time: 06/21/15 12:22 AM  Result Value Ref Range Status   Specimen Description URINE, CLEAN CATCH  Final   Special Requests Normal  Final   Culture 4,000 COLONIES/mL INSIGNIFICANT GROWTH  Final   Report Status 06/22/2015 FINAL  Final  MRSA PCR Screening     Status: None   Collection Time: 06/22/15  2:13 AM  Result Value Ref Range Status   MRSA by PCR NEGATIVE NEGATIVE Final    Comment:        The GeneXpert MRSA Assay (FDA approved for NASAL specimens only), is one component of a comprehensive MRSA colonization surveillance program. It is not intended to diagnose MRSA infection nor to guide or monitor treatment for MRSA infections.   MRSA PCR Screening     Status: None   Collection Time: 06/24/15  7:22 PM  Result Value Ref Range Status   MRSA by PCR NEGATIVE NEGATIVE Final    Comment:        The GeneXpert MRSA Assay (FDA approved for NASAL specimens only), is one component of a comprehensive MRSA colonization surveillance program. It is not intended to diagnose MRSA infection nor to guide or monitor treatment for MRSA infections.      Medications:   . sodium chloride   Intravenous Once  . antiseptic oral rinse  7 mL Mouth Rinse BID  . arformoterol  15 mcg Nebulization BID  . aspirin EC  81 mg Oral Daily  . atorvastatin  80 mg Oral q1800  . bromocriptine  2.5 mg Oral BID  . budesonide (PULMICORT) nebulizer solution  0.5 mg Nebulization BID  . clopidogrel  75 mg Oral Daily  . ferrous sulfate  325 mg Oral BID WC  . folic acid  1 mg Oral Daily  . levETIRAcetam  1,500 mg Oral Q12H   . levofloxacin (LEVAQUIN) IV  750 mg Intravenous Q24H  . Living Better with Heart Failure Book   Does not apply Once  .  morphine injection  2 mg Intravenous Once  . nicotine  14 mg Transdermal Daily  . pantoprazole  40 mg Oral BID  . phenytoin  50 mg Oral QHS  . phenytoin  300 mg Oral QHS  . polyethylene glycol  17 g Oral Daily  . sodium bicarbonate  650 mg Oral BID   Continuous Infusions:   Time spent: 45 minutes with > 50% of time discussing current diagnostic test results, clinical impression and plan of  care.  Note: spent an additional 30 minutes, from 4:30-5:00 pm with family to hold a discussion about diagnosis, prognosis, etc.   LOS: 3 days   RAMA,CHRISTINA  [facility] Pager [phone]. If unable to reach me by pager, please call my cell phone at [phone].  *Please refer to [email or web address], password [email or web address] to get updated schedule on who will round on this patient, as hospitalists switch teams weekly. If 7PM-7AM, please contact night-coverage at [email or web address], password [email or web address] for any overnight needs.  06/25/2015, 7:16 AM

## 2015-06-25 NOTE — Progress Notes (Signed)
Patient is currently off bi-pap at this time and on a 5L [place]. RT will continue to monitor.

## 2015-06-25 NOTE — Progress Notes (Signed)
Patient made aware that if he became SOB and had increased WOB and he wishes to go on BIPAP to let RN or RT know. Patient agreed that he understood. Pt vitals stable, no distress noted at this time.

## 2015-06-26 LAB — BASIC METABOLIC PANEL
ANION GAP: 8 (ref 5–15)
BUN: 8 mg/dL (ref 6–20)
CALCIUM: 7.5 mg/dL — AB (ref 8.9–10.3)
CHLORIDE: 109 mmol/L (ref 101–111)
CO2: 22 mmol/L (ref 22–32)
Creatinine, Ser: 0.93 mg/dL (ref 0.61–1.24)
GFR calc Af Amer: >60 mL/min (ref >60)
GFR calc non Af Amer: >60 mL/min (ref >60)
GLUCOSE: 114 mg/dL — AB (ref 65–99)
Potassium: 4.2 mmol/L (ref 3.5–5.1)
Sodium: 139 mmol/L (ref 135–145)

## 2015-06-26 LAB — CBC
HEMATOCRIT: 22.6 % — AB (ref 39.0–52.0)
Hemoglobin: 7 g/dL — ABNORMAL LOW (ref 13.0–17.0)
MCH: 27.7 pg (ref 26.0–34.0)
MCHC: 31 g/dL (ref 30.0–36.0)
MCV: 89.3 fL (ref 78.0–100.0)
Platelets: 295 10*3/uL (ref 150–400)
RBC: 2.53 MIL/uL — ABNORMAL LOW (ref 4.22–5.81)
RDW: 16.7 % — AB (ref 11.5–15.5)
WBC: 22.2 10*3/uL — AB (ref 4.0–10.5)

## 2015-06-26 LAB — PREPARE RBC (CROSSMATCH)

## 2015-06-26 MED ORDER — SODIUM CHLORIDE 0.9 % IV SOLN
Freq: Once | INTRAVENOUS | Status: AC
Start: 2015-06-26 — End: 2015-06-26
  Administered 2015-06-26: 10:00:00 via INTRAVENOUS

## 2015-06-26 MED ORDER — FUROSEMIDE 40 MG PO TABS
40.0000 mg | ORAL_TABLET | Freq: Every day | ORAL | Status: DC
  Administered 2015-06-27 – 2015-06-28 (×2): 40 mg via ORAL
  Filled 2015-06-26 (×2): qty 1

## 2015-06-26 MED ORDER — OXYCODONE HCL 5 MG PO TABS
10.0000 mg | ORAL_TABLET | ORAL | Status: DC | PRN
  Administered 2015-06-26 – 2015-06-30 (×17): 10 mg via ORAL
  Filled 2015-06-26 (×17): qty 2

## 2015-06-26 MED ORDER — DIPHENHYDRAMINE HCL 25 MG PO CAPS
25.0000 mg | ORAL_CAPSULE | Freq: Once | ORAL | Status: AC
  Administered 2015-06-26: 25 mg via ORAL

## 2015-06-26 MED ORDER — LEVOFLOXACIN 750 MG PO TABS
750.0000 mg | ORAL_TABLET | Freq: Every day | ORAL | Status: DC
  Administered 2015-06-26 – 2015-06-27 (×2): 750 mg via ORAL
  Filled 2015-06-26 (×2): qty 1

## 2015-06-26 MED ORDER — FUROSEMIDE 10 MG/ML IJ SOLN
40.0000 mg | Freq: Once | INTRAMUSCULAR | Status: AC
  Administered 2015-06-26: 40 mg via INTRAVENOUS

## 2015-06-26 MED ORDER — ACETAMINOPHEN 325 MG PO TABS
650.0000 mg | ORAL_TABLET | Freq: Once | ORAL | Status: AC
  Administered 2015-06-26: 650 mg via ORAL

## 2015-06-26 NOTE — Progress Notes (Signed)
Patient Name: Cristian Moss Date of Encounter: 06/26/2015  Principal Problem:   Sepsis [facility]) Active Problems:   Essential hypertension   Seizure disorder ([place])   Elevated troponin   Normocytic anemia   Tobacco abuse   CKD (chronic kidney disease), stage III   Cavitating mass in right upper lung lobe   Left adrenal mass (HCC)   Coronary artery disease due to lipid rich plaque   Claudication (HCC)   Dehydration   Low back pain   Chronic obstructive pulmonary disease (HCC)   PAD (peripheral artery disease) (HCC)   Back pain   Hypotension   CAD in native artery   Acute respiratory failure with hypoxia (HCC)   Acute diastolic heart failure ([place])   Postobstructive pneumonia   Patient Profile: Rosco Harriott is a 70 y.o. male with a history of hypertension, tobacco abuse, and stage  II-III chronic kidney disease. 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 He has a RUL mass which was recently found in Feb. 2017.  He had left heart cath on 05/06/15, he received DES to RCA. He was admitted for LE angiography on 06/20/15.  He had hypotension, leukocytosis, and mildly elevated lactate following the procedure. Found to have cavitating lung mass.      SUBJECTIVE:  Underwent biopsy of lung mass on Friday. Post procedure developed pulmonary edema and resp failure. Improved with IV lasix. Diuresis continued yesterday.   Weight unchanged. Still up 10 pounds from baseline. Received PRBCs today. Denies CP or SOB.    OBJECTIVE Filed Vitals:   06/26/15 0836 06/26/15 0925 06/26/15 0952 06/26/15 1142  BP:  114/66 100/62 106/63  Pulse:  91 94   Temp:  98.5 F (36.9 C) 98.6 F (37 C) 97.7 F (36.5 C)  TempSrc:  Oral Oral   Resp:   21   Height:      Weight:      SpO2: 99% 99% 99%     Intake/Output Summary (Last 24 hours) at 06/26/15 1440 Last data filed at 06/26/15 1142  Gross per 24 hour  Intake    553 ml    Output   1375 ml  Net   -822 ml   Filed Weights   06/24/15 0603 06/24/15 1920 06/26/15 0400  Weight: 80.241 kg (176 lb 14.4 oz) 80.2 kg (176 lb 12.9 oz) 80 kg (176 lb 5.9 oz)    PHYSICAL EXAM General: Cachetic. Frail appearing Head: Normocephalic, atraumatic.  Neck: Supple without bruits, JVP 8-9 Lungs:  clear Heart: RRR, S1, S2, no S3, S4, or murmur; no rub. Abdomen: Soft, non-tender, non-distended, BS + x 4.  Extremities: No cyanosis, no edema. + clubbing Neuro: Alert and oriented X 3. Moves all extremities spontaneously. Psych: Normal affect.  LABS: CBC:  Recent Labs  06/25/15 0251 06/26/15 0226  WBC 20.6* 22.2*  HGB 7.1* 7.0*  HCT 22.5* 22.6*  MCV 89.3 89.3  PLT 324 295   INR:  Recent Labs  06/24/15 0345  INR 2.35   Basic Metabolic Panel:  Recent Labs  06/25/15 0251 06/26/15 0226  NA 140 139  K 3.8 4.2  CL 114* 109  CO2 18* 22  GLUCOSE 102* 114*  BUN 10 8  CREATININE 0.73 0.93  CALCIUM 7.5* 7.5*   Cardiac Enzymes:No results for input(s): CKTOTAL, CKMB, CKMBINDEX, TROPONINI in the last 72 hours.  Thyroid Function Tests:No results for input(s): TSH, T4TOTAL, T3FREE,  THYROIDAB in the last 72 hours.  Invalid input(s): FREET3   Current facility-administered medications:  .  acetaminophen (TYLENOL) tablet 975 mg, 975 mg, Oral, Q6H PRN, Minus Breeding, MD, 975 mg at 06/22/15 1942 .  albuterol (PROVENTIL) (2.5 MG/3ML) 0.083% nebulizer solution 2.5 mg, 2.5 mg, Inhalation, Q3H PRN, Rahul P Desai, PA-C, 2.5 mg at 06/26/15 0328 .  antiseptic oral rinse (CPC / CETYLPYRIDINIUM CHLORIDE 0.05%) solution 7 mL, 7 mL, Mouth Rinse, BID, Almyra Deforest, PA, 7 mL at 06/26/15 1000 .  arformoterol (BROVANA) nebulizer solution 15 mcg, 15 mcg, Nebulization, BID, Rahul P Desai, PA-C, 15 mcg at 06/26/15 7939 .  aspirin EC tablet 81 mg, 81 mg, Oral, Daily, Troy Sine, MD, 81 mg at 06/26/15 1100 .  atorvastatin (LIPITOR) tablet 80 mg, 80 mg, Oral, q1800, Lorretta Harp, MD,  80 mg at 06/25/15 1709 .  bisacodyl (DULCOLAX) EC tablet 5 mg, 5 mg, Oral, Daily PRN, Lorretta Harp, MD, 5 mg at 06/24/15 1545 .  bromocriptine (PARLODEL) tablet 2.5 mg, 2.5 mg, Oral, BID, Lorretta Harp, MD, 2.5 mg at 06/26/15 1100 .  budesonide (PULMICORT) nebulizer solution 0.5 mg, 0.5 mg, Nebulization, BID, Rahul P Desai, PA-C, 0.5 mg at 06/26/15 0833 .  clopidogrel (PLAVIX) tablet 75 mg, 75 mg, Oral, Daily, Arbutus Leas, NP, 75 mg at 06/26/15 1101 .  dronabinol (MARINOL) capsule 2.5 mg, 2.5 mg, Oral, BID AC, Venetia Maxon Rama, MD, 2.5 mg at 06/26/15 1248 .  fentaNYL (SUBLIMAZE) injection 25-50 mcg, 25-50 mcg, Intravenous, Q4H PRN, Nishant Dhungel, MD, 50 mcg at 06/26/15 0216 .  ferrous sulfate tablet 325 mg, 325 mg, Oral, BID WC, Lorretta Harp, MD, 325 mg at 06/26/15 0916 .  folic acid (FOLVITE) tablet 1 mg, 1 mg, Oral, Daily, Troy Sine, MD, 1 mg at 06/26/15 1101 .  furosemide (LASIX) injection 40 mg, 40 mg, Intravenous, BID, Jolaine Artist, MD, 40 mg at 06/26/15 1248 .  levETIRAcetam (KEPPRA) tablet 1,500 mg, 1,500 mg, Oral, Q12H, Lorretta Harp, MD, 1,500 mg at 06/26/15 1100 .  levofloxacin (LEVAQUIN) tablet 750 mg, 750 mg, Oral, Daily, Myrene Galas, [facility] .  Living Better with Heart Failure Book, , Does not apply, Once, Lorretta Harp, MD .  morphine 2 MG/ML injection 2 mg, 2 mg, Intravenous, Once, Louellen Molder, MD, Stopped at 06/24/15 1530 .  nicotine (NICODERM CQ - dosed in mg/24 hours) patch 14 mg, 14 mg, Transdermal, Daily, Lorretta Harp, MD, 14 mg at 06/26/15 1101 .  nitroGLYCERIN (NITROSTAT) SL tablet 0.4 mg, 0.4 mg, Sublingual, Q5 min PRN, Lorretta Harp, MD .  [facility]ansetron [facility]) injection 4 mg, 4 mg, Intravenous, Q6H PRN, Lorretta Harp, MD, 4 mg at 06/20/15 1445 .  oxyCODONE (Oxy IR/ROXICODONE) immediate release tablet 10 mg, 10 mg, Oral, Q4H PRN, Venetia Maxon Rama, MD, 10 mg at 06/26/15 1356 .  pantoprazole (PROTONIX) EC tablet 40 mg, 40 mg, Oral,  BID, Lorretta Harp, MD, 40 mg at 06/26/15 1100 .  phenytoin (DILANTIN) chewable tablet 50 mg, 50 mg, Oral, QHS, Lorretta Harp, MD, 50 mg at 06/25/15 2218 .  phenytoin (DILANTIN) ER capsule 300 mg, 300 mg, Oral, QHS, Lorretta Harp, MD, 300 mg at 06/25/15 2218 .  polyethylene glycol (MIRALAX / GLYCOLAX) packet 17 g, 17 g, Oral, Daily, Nishant Dhungel, MD, 17 g at 06/26/15 1000 .  potassium chloride (K-DUR) CR tablet 20 mEq, 20 mEq, Oral, BID, Jolaine Artist, MD, 20 mEq  at 06/26/15 1100 .  sodium bicarbonate tablet 650 mg, 650 mg, Oral, BID, Nishant Dhungel, MD, 650 mg at 06/26/15 1100    TELE:   NSR      Radiology/Studies: Dg Chest Port 1v Same Day  06/24/2015  CLINICAL DATA:  Congestive heart failure. Increase shortness of breath. Right lung mass. Biopsy earlier today. EXAM: PORTABLE CHEST 1 VIEW COMPARISON:  Plain films, including earlier today at 1349 hours. FINDINGS: Patient rotated right. Normal heart size with a tortuous thoracic aorta. No pleural effusion or pneumothorax. Right upper lobe lung mass again identified. There is patchy interstitial and airspace disease within the right upper, right lower, and left lower lobes. New. IMPRESSION: Multifocal interstitial and airspace disease, worse on the right. This is superimposed upon a known right upper lobe lung mass. Given time course of development, favored to represent pulmonary edema. Aspiration pneumonitis could have a similar appearance. No pneumothorax or other acute complication of biopsy. The right upper lobe opacity is felt unlikely to represent biopsy induced hemorrhage, given development of multi focal airspace disease. Electronically Signed   By: Abigail Miyamoto M.D.   On: 06/24/2015 19:12     Current Medications:  . antiseptic oral rinse  7 mL Mouth Rinse BID  . arformoterol  15 mcg Nebulization BID  . aspirin EC  81 mg Oral Daily  . atorvastatin  80 mg Oral q1800  . bromocriptine  2.5 mg Oral BID  . budesonide  (PULMICORT) nebulizer solution  0.5 mg Nebulization BID  . clopidogrel  75 mg Oral Daily  . dronabinol  2.5 mg Oral BID AC  . ferrous sulfate  325 mg Oral BID WC  . folic acid  1 mg Oral Daily  . furosemide  40 mg Intravenous BID  . levETIRAcetam  1,500 mg Oral Q12H  . levofloxacin  750 mg Oral Daily  . Living Better with Heart Failure Book   Does not apply Once  .  morphine injection  2 mg Intravenous Once  . nicotine  14 mg Transdermal Daily  . pantoprazole  40 mg Oral BID  . phenytoin  50 mg Oral QHS  . phenytoin  300 mg Oral QHS  . polyethylene glycol  17 g Oral Daily  . potassium chloride  20 mEq Oral BID  . sodium bicarbonate  650 mg Oral BID      ASSESSMENT AND PLAN: Principal Problem:   Sepsis ([place]) Active Problems:   Essential hypertension   Seizure disorder (HCC)   Elevated troponin   Normocytic anemia   Tobacco abuse   CKD (chronic kidney disease), stage III   Cavitating mass in right upper lung lobe   Left adrenal mass (HCC)   Coronary artery disease due to lipid rich plaque   Claudication (HCC)   Dehydration   Low back pain   Chronic obstructive pulmonary disease (HCC)   PAD (peripheral artery disease) (HCC)   Back pain   Hypotension   CAD in native artery   Acute respiratory failure with hypoxia (HCC)   Acute diastolic heart failure (HCC)   Postobstructive pneumonia    1. Acute hypoxic respiratory failure post procedure 3/31 due to acute diastolic HF -improved with diuresis. He got 2 doses of IV lasix today already. Will switch back to po lasix for tomorrow.  -EF 45-50% by echo 2/17  2. PAD: - doppler 05/31/2015 right ABI of 0.72 with high grade disease in his R SFA and L ABI 0.59 with occluded L  SFA -  LE angiography 06/20/2015 [facility] 1 directional atherectomy with drug eluding balloon angioplasty R SFA  3. SIRS with leukocytosis:         --improved. IM managing  4. Cavitating mass in RUL, note to have large 8.9x6.8 xm lung mass in posterior  R upper lobe: - s/p biopsy 3/31. Back on Plavix  - path pending. 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 CAD s/p DES to RCA: - cath 05/06/2015 with 80% prox RCA treated with DES, 65% 1st Marginal treated medically.  - echo 05/04/2015 EF 45-50%, hypokinesis of apicalanteroseptal myocardium, PA peak pressure 29mHg  6. Worsening anemia:  - suspect possibly due to his malignancy. Ct negative for bleed.     - started on po iron. Had transfusion today  7. L adrenal mass: recent CT of chest 3/15, 7.1x6.1 cm large L adrenal mass increased in size, suspicious for metastasis from lung mass   Bensimhon, Daniel,MD 2:40 PM

## 2015-06-26 NOTE — Progress Notes (Signed)
Progress Note   Cristian Moss LTJ:[id number] DOB: 06/01/1945 DOA: 72/27/2017 PCP: [name], MD   Brief Narrative:   Cristian Moss is an 70 y.o. male recently admitted with seizures and found to have right upper lobe mass, severe peripheral artery disease admitted for right angiography on 06/20/2015 with atherectomy and drug-eluting balloon angioplasty of right SFA. Postoperatively patient became hypertensive and had leukocytosis with elevated lactate of 2. Placed on empiric antibiotic and given IV hydration. On the evening of 3/28 hemoglobin dropped to 7.3 and given 1 unit PRBC. While being transfused he had low-grade fever and subsequently became persistently hypotensive, was transferred to ICU. Patient was placed on IV hydrocortisone and given IV fluid resuscitation, improved subsequently and transferred to telemetry. On 06/24/15, the patient was transferred back to the SDU secondary to acute hypoxic respiratory failure requiring BiPAP.  Assessment/Plan:   Sepsis with hypotension with hypovolemia/dehydration status post procedure (abdominal aortogram with bifemoral runoff and angioplasty of the right SFA)/probable postobstructive pneumonia Initial concern was for infection (lactic acid and Pro calcitonin both elevated on admission), but cultures have remained negative. The patient has improved on empiric antibiotics (status post 5 days of Zosyn/vancomycin, now on Levaquin) to treat a possible postobstructive pneumonia given right upper lobe mass, stress dose steroids and IV fluids. He remains off blood pressure medications. Cortisol was within normal limits at 12.8. Stress dose steroids have now been weaned and the patient's blood pressure has remained stable. Continue Levaquin. WBC rising.  Cavitating mass in right upper lung lobe suspicious for malignancy Large cavitating RUL mass measuring 8.96.8cm seen on CT scan 05/04/15 which is suspicious for malignancy. History of  >40-pack-year smoking. Status post CT-guided biopsy 06/24/15. Results pending.  Large left adrenal mass (8cm) Needs PET-CT as outpatient. Suspicion for malignancy/ ? Lung metastases. Mass has increased in size on recent CT compared to one month back. Needs oncology consult depending upon biopsy results.  Acute on chronic hypoxic respiratory failure/acute diastolic CHF Multifactorial. Initially likely from sepsis, acute diastolic heart failure related to volume overload in the setting for need for fluid volume resuscitation. Developed acute respiratory failure overnight 06/24/15 and was transferred back to the stepdown unit placed on BiPAP. He was diuresed further and was weaned back to nasal cannula oxygen. He remains volume overloaded on Lasix 40 mg twice a day. EF 45-50 percent by echocardiogram done 04/2015. Cardiology following.  COPD Continue Brovana and budesonide. Continue albuterol as needed for wheezing.  Severe peripheral artery disease High-grade disease a SFA status post angiography, atherectomy with drug-eluting balloon angioplasty of R SFA.  CAD with recent cardiac cath with 80% proximal RCA stenosis treated with DES/elevated troponin Continue aspirin and statin. Plavix held for lung biopsy. Resumed 06/24/15. Had mild elevation of troponin 1. Cardiology following.  Iron deficiency anemia Status post 2 units of PRBCs post procedure. Progressive drop in H&H. Check Hemoccult. Continue iron supplement. Hemoglobin 7 today. We'll give 1 unit of PRBCs.  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 Follows with outpatient neurology.  Tobacco abuse Counseled strongly on cessation. Nicotine patch.  Chronic kidney disease stage III Stable.  Low back pain Patient be due to L1 compression fracture seen on imaging. Pain control and PT ordered. Bowel regimen started.  Non anion gap Metabolic acidosis Continue sodium bicarbonate tablet.  Bicarbonate WNL today.  Fatigue and protein calorie malnutrition, likely severe Patient is very weak and has had a poor appetite for a long time. He  is losing a significant amount of weight and has muscle wasting. We'll start him on Marinol for appetite stimulation and obtain dietitian consultation for further recommendations.   Family Communication/Anticipated D/C date and plan/Code Status   Family Communication: Wife and daughter at the bedside 06/25/15, no family present today. Disposition Plan/date: From home, but is significantly deconditioned and will likely need SNF. Awaiting final biopsy results and medical stability prior to discharge. Code Status: Full code.   Procedures and diagnostic studies:   Ct Abdomen Pelvis Wo Contrast  06/21/2015  CLINICAL DATA:  Back pain. Catheterization yesterday. Evaluate for retroperitoneal hematoma. EXAM: CT ABDOMEN AND PELVIS WITHOUT CONTRAST TECHNIQUE: Multidetector CT imaging of the abdomen and pelvis was performed following the standard protocol without IV contrast. COMPARISON:  05/05/2015 FINDINGS: Lower chest and abdominal wall: Fatty enlargement of the bilateral inguinal canal. There is a small right pleural effusion which is newly seen recent chest CT, with atelectatic type opacity in the right lower lobe. Hepatobiliary: Low-density foci best in the liver seen on prior enhanced study are not visible by this technique.Cholelithiasis. No signs of obstruction or inflammation. Pancreas: There is a 6 mm low-density mass in the uncinate process, doubtful clinical significance given the lung and left adrenal findings. This will be followed with the adrenal mass. Spleen: Unremarkable. Adrenals/Urinary Tract: Continued enlargement of a left adrenal mass which has cystic density but mural complexity on prior enhanced CT. Mass measures up to 85 mm today. Cortical retention and continued excretion of IV contrast from catheterization yesterday. Although initially  concerning for ATN there is stable creatinine clearance. No hydronephrosis. Negative bladder. Reproductive:No pathologic findings. Stomach/Bowel: Suspect proximal gastric diverticulum containing high-density material. Prominent volume of formed stool. No obstruction. No appendicitis. Vascular/Lymphatic: No retroperitoneal hematoma. Diffuse atherosclerosis No mass or adenopathy. Peritoneal: No ascites or pneumoperitoneum. Musculoskeletal: L1 superior endplate fracture with mild depression, stable from chest CT 06/08/2015. No visualized osseous metastasis. Chronic L5 right pars defect. IMPRESSION: 1. Negative for retroperitoneal hematoma. 2. Trace right pleural effusion with lower lobe atelectasis. 3. Continued growth of the cystic left adrenal mass, now ~8 cm, metastasis favored. 4. L1 superior endplate fracture with stable depression since 06/08/2015. 5. Probable developing constipation. Electronically Signed   By: Monte Fantasia M.D.   On: 06/21/2015 12:54   Dg Lumbar Spine Complete  06/08/2015  CLINICAL DATA:  Low back pain for several days.  No injury. EXAM: LUMBAR SPINE - COMPLETE 4+ VIEW COMPARISON:  CT abdomen and pelvis 05/05/2015 FINDINGS: There are 5 non rib-bearing lumbar type vertebral bodies. Vertebral alignment is normal. Vertebral body heights are preserved. There is minimal disc space narrowing at L5-S1, with vacuum disc phenomenon noted on the prior CT. Disc space heights are preserved elsewhere. A right L5 pars defect is noted. Atherosclerotic aortic calcification is noted. IMPRESSION: 1. No acute osseous abnormality identified. 2. 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 Minimal L5-S1 disc narrowing. Electronically Signed   By: Logan Bores M.D.   On: 06/08/2015 13:07   Ct Chest Wo Contrast  06/08/2015  CLINICAL DATA:  70 year old male with cavitary right upper lobe lung mass presenting for follow-up status post antibiotic therapy. EXAM: CT CHEST WITHOUT CONTRAST TECHNIQUE: Multidetector CT  imaging of the chest was performed following the standard protocol without IV contrast. COMPARISON:  05/04/2015 chest CT . FINDINGS: Mediastinum/Nodes: Normal heart size. No pericardial fluid/thickening. Extensive coronary atherosclerosis. Atherosclerotic nonaneurysmal thoracic aorta. Stable dilated main pulmonary artery (3.1 cm diameter). Normal visualized thyroid. Normal esophagus. No axillary adenopathy. No  pathologically enlarged mediastinal lymph nodes. Stable mildly enlarged 1.4 cm right hilar node (series 2/ image 29). No gross left hilar adenopathy on this noncontrast study. Lungs/Pleura: No pneumothorax. No pleural effusion. There are layering inspissated secretions throughout the lower trachea and bilateral mainstem bronchi. There is mild centrilobular emphysema and diffuse bronchial wall thickening. There is a large 8.9 x 6.8 cm lung mass in the posterior right upper lobe (series 3/ image 25), which abuts the visceral pleural and right major fissure, previously measuring 7.6 x 5.8 cm on 05/04/2015, significantly increased in size. Please note that the mass significantly distorts the superior aspect of the right major fissure in the mass may traverse the fissure into the superior segment of the right lower lobe. There is patchy consolidation, ground-glass opacity and reticulation throughout the dependent and basilar right upper lobe surrounding the right upper lobe mass, not definitely changed. Solid 1.2 cm lingular pulmonary nodule (series 3/image 35), previously 1.1 cm on 05/04/2015 and 0.8 cm on 01/17/2009, not definitely changed in the interval. New atelectasis in the posterior lingula. No new significant pulmonary nodules. Upper abdomen: Partial visualization of large left adrenal mass measuring at least 7.1 x 6.1 cm, previously 6.3 x 5.8 cm, increased in size. Musculoskeletal: There is new lucency and irregularity of the visualize superior endplate of the L1 vertebral body, cannot exclude L1  fracture. IMPRESSION: 1. 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 2. No appreciable change in nonspecific patchy consolidation, ground-glass opacity and reticulation throughout the posterior/basilar right upper lobe surrounding the lung mass, which could represent alveolar hemorrhage and/or lymphangitic tumor spread. 3. Solid 1.2 cm lingular pulmonary nodule, slowly growing since 2010, indeterminate. 4. Stable mild right hilar lymphadenopathy, suspicious for nodal metastasis. No mediastinal lymphadenopathy. 5. 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 6. Recommend further evaluation of these findings with PET-CT. 7. New lucency and irregularity of the visualized superior endplate of the L1 vertebral body, cannot exclude L1 vertebral fracture. Consider correlation with lumbar spine MRI as clinically warranted. 8. Additional findings include coronary atherosclerosis, chronic main pulmonary artery dilation suggesting pulmonary arterial hypertension and mild emphysema. Electronically Signed   By: Ilona Sorrel M.D.   On: 06/08/2015 13:03   Ct Biopsy  06/24/2015  INDICATION: Enlarging posterior right upper lobe mass EXAM: CT-GUIDED BIOPSY LARGE RIGHT UPPER LOBE CENTRALLY NECROTIC MASS MEDICATIONS: 1% LIDOCAINE LOCALLY ANESTHESIA/SEDATION: 1.0 mg IV Versed; 25 mcg IV Fentanyl Moderate Sedation Time:  20 MINUTES The patient was continuously monitored during the procedure by the interventional radiology nurse under my direct supervision. PROCEDURE: The procedure, risks, benefits, and alternatives were explained to the patient. Questions regarding the procedure were encouraged and answered. The patient  understands and consents to the procedure. The right posterior back was prepped with ChloraPrep in a sterile fashion, and a sterile drape was applied covering the operative field. A sterile gown and sterile gloves were used for the procedure. Previous imaging reviewed. Patient positioned right side down decubitus. Noncontrast localization CT performed. The large posterior right upper lobe mass was localized. Under sterile conditions and local anesthesia, a 17 gauge 6.8 cm access needle was advanced from a posterior intercostal approach to the periphery of the right upper lobe mass. Needle position confirmed with CT. 18  gauge core biopsies obtained and placed in formalin. Needle tract embolized with the biosentry device to aide in pneumothorax prevention. Postprocedure imaging demonstrates no complication. Patient tolerated the procedure well without complication. Vital sign monitoring by nursing staff during the procedure will continue as patient is in the special procedures unit for post procedure observation. FINDINGS: The images document guide needle placement within the large posterior right upper lobe mass. Post biopsy images demonstrate no effusion or pneumothorax. COMPLICATIONS: None immediate. IMPRESSION: Successful CT-guided core biopsy of the right upper lobe mass. Electronically Signed   By: Jerilynn Mages.  Shick M.D.   On: 06/24/2015 11:05   Dg Chest Port 1 View  06/24/2015  CLINICAL DATA:  Cavitating right upper lobe lung mass. EXAM: PORTABLE CHEST 1 VIEW COMPARISON:  06/22/2015 and CT 06/08/2015 FINDINGS: Examination demonstrates adequate lung volumes with no significant change in patient's known large right lung mass which has undergone recent percutaneous biopsy. There is less interstitial density adjacent to the mass. No significant effusion. No evidence of pneumothorax. Cardiomediastinal silhouette and remainder of the exam is unchanged. IMPRESSION: Stable known large right upper lobe lung mass post recent  percutaneous biopsy. Near resolution of previously seen heterogeneous density adjacent the mass. Electronically Signed   By: Marin Olp M.D.   On: 06/24/2015 14:03   Dg Chest Port 1 View  06/22/2015  CLINICAL DATA:  Respiratory failure . EXAM: PORTABLE CHEST 1 VIEW COMPARISON:  06/20/2015.  CT 06/08/2015. FINDINGS: Mediastinum is stable. Heart size normal. Right upper lobe infiltrate/mass again noted. Again this could represent a pulmonary abscess or neoplasm. Improvement of right upper lobe atelectasis prior exam. Mild bibasilar atelectasis. No pleural effusion or pneumothorax . IMPRESSION: 1. Persistent rounded infiltrate/mass right upper lobe. Again this could represent a pulmonary abscess or neoplasm. Improvement of right upper lobe atelectasis noted on today's exam. 2. Mild bibasilar atelectasis. Electronically Signed   By: Marcello Moores  Register   On: 06/22/2015 07:14   Dg Chest Port 1 View  06/20/2015  CLINICAL DATA:  Tachycardia, chills, elevated white blood cells EXAM: PORTABLE CHEST 1 VIEW COMPARISON:  06/08/2015 FINDINGS: There is right upper lobe consolidation. There is no pleural effusion or pneumothorax. The heart and mediastinal contours are unremarkable. The osseous structures are unremarkable. IMPRESSION: Right upper lobe consolidation most concerning for pneumonia. Given the masslike area in the right upper lobe on previous CT of the chest dated 06/09/2015 this is concerning for possible intrapulmonary abscess. Electronically Signed   By: Kathreen Devoid   On: 06/20/2015 16:31   Dg Chest Port 1v Same Day  06/24/2015  CLINICAL DATA:  Congestive heart failure. Increase shortness of breath. Right lung mass. Biopsy earlier today. EXAM: PORTABLE CHEST 1 VIEW COMPARISON:  Plain films, including earlier today at 1349 hours. FINDINGS: Patient rotated right. Normal heart size with a tortuous thoracic aorta. No pleural effusion or pneumothorax. Right upper lobe lung mass again identified. There is patchy  interstitial and airspace disease within the right upper, right lower, and left lower lobes. New. IMPRESSION: Multifocal interstitial and airspace disease, worse on the right. This is superimposed upon a known right upper lobe lung mass. Given time course of development, favored to represent pulmonary edema. Aspiration pneumonitis could have a similar appearance. No pneumothorax or other acute complication of biopsy. The right upper lobe opacity is felt unlikely to represent biopsy induced hemorrhage, given development of multi focal airspace disease. Electronically Signed   By: Abigail Miyamoto M.D.   On: 06/24/2015 19:12    Medical  Consultants:    Pulmonology  Interventional Radiology  Anti-Infectives:   Levaquin 06/24/15---> Vancomycin/Zosyn 06/20/15---> 06/24/15   Subjective:   Cristian Moss is weak.  Doesn't remember taking Marinol yesterday, but RN reports appetite remains poor.  Didn't eat any breakfast.  Denies pain, dyspnea at present, but had back pain earlier today and needed to have oxycodone dose increased to 10 mg.  No nausea or vomiting.  Objective:    Filed Vitals:   06/25/15 2000 06/25/15 2300 06/26/15 0000 06/26/15 0400  BP: 115/63  111/60 116/65  Pulse: 98 102 102 88  Temp:  98 F (36.7 C)  98.1 F (36.7 C)  TempSrc:  Oral  Oral  Resp: '22 18 24 18  '$ Height:      Weight:    80 kg (176 lb 5.9 oz)  SpO2: 100% 99% 97% 97%    Intake/Output Summary (Last 24 hours) at 06/26/15 0721 Last data filed at 06/25/15 2335  Gross per 24 hour  Intake    270 ml  Output   1375 ml  Net  -1105 ml   Filed Weights   06/24/15 0603 06/24/15 1920 06/26/15 0400  Weight: 80.241 kg (176 lb 14.4 oz) 80.2 kg (176 lb 12.9 oz) 80 kg (176 lb 5.9 oz)    Exam: Gen:  Frail, elderly with weakness apparent Cardiovascular:  RRR, No M/R/G Respiratory:  Lungs with faint rhonchi bilaterally Gastrointestinal:  Abdomen soft, NT/ND, + BS Extremities:  Clubbing present, no edema   Data Reviewed:     Labs: Basic Metabolic Panel:  Recent Labs Lab 06/22/15 0342 06/23/15 0420 06/24/15 0345 06/25/15 0251 06/26/15 0226  NA 141  139 139 140 140 139  K 4.0  3.9 3.8 3.8 3.8 4.2  CL 117*  115* 116* 115* 114* 109  CO2 17*  17* 15* 16* 18* 22  GLUCOSE 116*  114* 175* 102* 102* 114*  BUN '17  18 17 12 10 8  '$ CREATININE 1.11  1.07 1.03 0.98 0.73 0.93  CALCIUM 7.4*  7.4* 7.4* 7.6* 7.5* 7.5*  MG 1.5*  --   --   --   --   PHOS 2.9  --   --   --   --    GFR Estimated Creatinine Clearance: 75.2 mL/min (by C-G formula based on Cr of 0.93). Liver Function Tests:  Recent Labs Lab 06/20/15 1926  AST 68*  ALT 27  ALKPHOS 82  BILITOT 0.3  PROT 6.0*  ALBUMIN 1.9*   Coagulation profile  Recent Labs Lab 06/20/15 0649 06/20/15 1926 06/24/15 0345  INR 1.32 1.58* 1.44    CBC:  Recent Labs Lab 06/20/15 1926  06/22/15 0342 06/22/15 1230 06/23/15 0420 06/24/15 0345 06/25/15 0251 06/26/15 0226  WBC 23.9*  < > 19.8*  --  33.6* 29.3* 20.6* 22.2*  NEUTROABS 19.5*  --   --   --   --   --   --   --   HGB 7.8*  < > 7.1* 8.2* 7.1* 7.5* 7.1* 7.0*  HCT 24.6*  < > 22.6* 25.6* 22.0* 24.8* 22.5* 22.6*  MCV 90.8  < > 91.1  --  89.8 90.5 89.3 89.3  PLT 309  < > 252  --  263 330 324 295  < > = values in this interval not displayed. Cardiac Enzymes:  Recent Labs Lab 06/22/15 0342  TROPONINI 0.06*   CBG:  Recent Labs Lab 06/22/15 0222  GLUCAP 112*   Sepsis Labs:  Recent Labs Lab 06/20/15 1926  06/20/15 2154  06/22/15 0342 06/23/15 0420 06/24/15 0345 06/25/15 0251 06/26/15 0226  PROCALCITON 1.27  --   --  0.75 0.59 0.46  --   --   WBC 23.9*  --   < > 19.8* 33.6* 29.3* 20.6* 22.2*  LATICACIDVEN 2.2* 2.0  --  0.8  --   --   --   --   < > = values in this interval not displayed. Microbiology Recent Results (from the past 240 hour(s))  Culture, blood (x 2)     Status: None   Collection Time: 06/20/15  7:35 PM  Result Value Ref Range Status   Specimen  Description BLOOD BLOOD LEFT FOREARM  Final   Special Requests BOTTLES DRAWN AEROBIC AND ANAEROBIC 5CC  Final   Culture NO GROWTH 5 DAYS  Final   Report Status 06/25/2015 FINAL  Final  Culture, blood (x 2)     Status: None   Collection Time: 06/20/15  7:50 PM  Result Value Ref Range Status   Specimen Description BLOOD RIGHT ARM  Final   Special Requests BOTTLES DRAWN AEROBIC AND ANAEROBIC 5CC  Final   Culture NO GROWTH 5 DAYS  Final   Report Status 06/25/2015 FINAL  Final  Urine culture     Status: None   Collection Time: 06/21/15 12:22 AM  Result Value Ref Range Status   Specimen Description URINE, CLEAN CATCH  Final   Special Requests Normal  Final   Culture 4,000 COLONIES/mL INSIGNIFICANT GROWTH  Final   Report Status 06/22/2015 FINAL  Final  MRSA PCR Screening     Status: None   Collection Time: 06/22/15  2:13 AM  Result Value Ref Range Status   MRSA by PCR NEGATIVE NEGATIVE Final    Comment:        The GeneXpert MRSA Assay (FDA approved for NASAL specimens only), is one component of a comprehensive MRSA colonization surveillance program. It is not intended to diagnose MRSA infection nor to guide or monitor treatment for MRSA infections.   MRSA PCR Screening     Status: None   Collection Time: 06/24/15  7:22 PM  Result Value Ref Range Status   MRSA by PCR NEGATIVE NEGATIVE Final    Comment:        The GeneXpert MRSA Assay (FDA approved for NASAL specimens only), is one component of a comprehensive MRSA colonization surveillance program. It is not intended to diagnose MRSA infection nor to guide or monitor treatment for MRSA infections.      Medications:   . antiseptic oral rinse  7 mL Mouth Rinse BID  . arformoterol  15 mcg Nebulization BID  . aspirin EC  81 mg Oral Daily  . atorvastatin  80 mg Oral q1800  . bromocriptine  2.5 mg Oral BID  . budesonide (PULMICORT) nebulizer solution  0.5 mg Nebulization BID  . clopidogrel  75 mg Oral Daily  . dronabinol   2.5 mg Oral BID AC  . ferrous sulfate  325 mg Oral BID WC  . folic acid  1 mg Oral Daily  . furosemide  40 mg Intravenous BID  . levETIRAcetam  1,500 mg Oral Q12H  . levofloxacin (LEVAQUIN) IV  750 mg Intravenous Q24H  . Living Better with Heart Failure Book   Does not apply Once  .  morphine injection  2 mg Intravenous Once  . nicotine  14 mg Transdermal Daily  . pantoprazole  40 mg Oral BID  . phenytoin  50 mg Oral QHS  .  phenytoin  300 mg Oral QHS  . polyethylene glycol  17 g Oral Daily  . potassium chloride  20 mEq Oral BID  . sodium bicarbonate  650 mg Oral BID   Continuous Infusions:   Time spent: 35 minutes.  The patient is medically complex with multiple co-morbidities and is at high risk for clinical deterioration and requires high complexity decision making.    LOS: 4 days   [facility] Pager [phone]. If unable to reach me by pager, please call my cell phone at [phone].  *Please refer to [email or web address], password [email or web address] to get updated schedule on who will round on this patient, as hospitalists switch teams weekly. If 7PM-7AM, please contact night-coverage at [email or web address], password [email or web address] for any overnight needs.  06/26/2015, 7:21 AM

## 2015-06-26 NOTE — Progress Notes (Signed)
Patient is on 4 LPM nasal cannula with humidification. No distress noted, SPO2 98. Pt made aware that if he got SOB or had increased WOB to call and we would place him on BIPAP if needed. RT will continue to monitor.

## 2015-06-27 ENCOUNTER — Inpatient Hospital Stay (HOSPITAL_COMMUNITY): Payer: PPO

## 2015-06-27 DIAGNOSIS — I5023 Acute on chronic systolic (congestive) heart failure: Secondary | ICD-10-CM

## 2015-06-27 LAB — CBC WITH DIFFERENTIAL/PLATELET
BASOS ABS: 0 10*3/uL (ref 0.0–0.1)
Basophils Relative: 0 %
EOS ABS: 1.3 10*3/uL — AB (ref 0.0–0.7)
Eosinophils Relative: 5 %
HCT: 26.7 % — ABNORMAL LOW (ref 39.0–52.0)
HEMOGLOBIN: 8.7 g/dL — AB (ref 13.0–17.0)
LYMPHS ABS: 2 10*3/uL (ref 0.7–4.0)
LYMPHS PCT: 8 %
MCH: 29.2 pg (ref 26.0–34.0)
MCHC: 32.6 g/dL (ref 30.0–36.0)
MCV: 89.6 fL (ref 78.0–100.0)
MONO ABS: 2 10*3/uL — AB (ref 0.1–1.0)
Monocytes Relative: 8 %
NEUTROS ABS: 20.3 10*3/uL — AB (ref 1.7–7.7)
Neutrophils Relative %: 79 %
PLATELETS: 274 10*3/uL (ref 150–400)
RBC: 2.98 MIL/uL — ABNORMAL LOW (ref 4.22–5.81)
RDW: 16.5 % — AB (ref 11.5–15.5)
Smear Review: ADEQUATE
WBC: 25.6 10*3/uL — ABNORMAL HIGH (ref 4.0–10.5)

## 2015-06-27 LAB — TYPE AND SCREEN
ABO/RH(D): O POS
ANTIBODY SCREEN: NEGATIVE
UNIT DIVISION: 0

## 2015-06-27 LAB — BASIC METABOLIC PANEL
Anion gap: 7 (ref 5–15)
BUN: 8 mg/dL (ref 6–20)
CALCIUM: 7.8 mg/dL — AB (ref 8.9–10.3)
CO2: 25 mmol/L (ref 22–32)
CREATININE: 1.02 mg/dL (ref 0.61–1.24)
Chloride: 106 mmol/L (ref 101–111)
Glucose, Bld: 109 mg/dL — ABNORMAL HIGH (ref 65–99)
Potassium: 4.5 mmol/L (ref 3.5–5.1)
SODIUM: 138 mmol/L (ref 135–145)

## 2015-06-27 MED ORDER — DIAZEPAM 5 MG PO TABS
5.0000 mg | ORAL_TABLET | Freq: Three times a day (TID) | ORAL | Status: DC | PRN
  Administered 2015-06-28 – 2015-06-30 (×3): 5 mg via ORAL
  Filled 2015-06-27 (×3): qty 1

## 2015-06-27 MED ORDER — DIAZEPAM 5 MG PO TABS
ORAL_TABLET | ORAL | Status: AC
Start: 2015-06-27 — End: 2015-06-27
  Administered 2015-06-27: 5 mg
  Filled 2015-06-27: qty 1

## 2015-06-27 MED ORDER — ENSURE ENLIVE PO LIQD
237.0000 mL | Freq: Three times a day (TID) | ORAL | Status: DC
  Administered 2015-06-27 – 2015-06-28 (×4): 237 mL via ORAL

## 2015-06-27 NOTE — Care Management Important Message (Signed)
Important Message  Patient Details  Name: Cristian Moss MRN: [id number] Date of Birth: [birth year]   Medicare Important Message Given:  Yes    Barb Merino Argel Pablo 06/27/2015, 12:41 PM

## 2015-06-27 NOTE — Progress Notes (Signed)
Nutrition Follow-up  DOCUMENTATION CODES:   Not applicable  INTERVENTION:   -Ensure Enlive po TID, each supplement provides 350 kcal and 20 grams of protein  NUTRITION DIAGNOSIS:   Inadequate oral intake related to poor appetite as evidenced by meal completion < 50%.  Ongoing  GOAL:   Patient will meet greater than or equal to 90% of their needs  Unmet  MONITOR:   PO intake, Supplement acceptance, Labs, Weight trends, Skin, I & O's  REASON FOR ASSESSMENT:   Consult Assessment of nutrition requirement/status  ASSESSMENT:   Cristian Moss is a 70 y.o. male with a history of hypertension, tobacco abuse, and stage II-III chronic kidney disease. 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  Pt transferred to SDU on 06/24/15, due to respiratory distress and need for Bi-pap.   RD re-consulted for poor po intake and assessment of nutritional needs and status. Attempted to examine pt x 3, however, pt was in with multiple providers at times of visits (MD, nursing, and physical therapy). RN reports that family is awaiting pulmonology evaluation.   Pt with rt lung and renal mass; s/p RUL mass biopsy with IR on 06/24/15. Awaiting pathology results. Per MD notes, suspicious for malignancy.  Pt has been advanced to a heart healthy diet. Meal completion 10-75%, but averaging around 45% meal completion. Marinol ordered by MD on 06/25/15. RD will add supplements in attempt to maximize intake.   Labs reviewed.   Diet Order:  Diet Heart Room service appropriate?: Yes; Fluid consistency:: Thin  Skin:  Reviewed, no issues  Last BM:  06/20/15  Height:   Ht Readings from Last 1 Encounters:  06/24/15 '5\' 10"'$  (1.778 m)    Weight:   Wt Readings from Last 1 Encounters:  06/27/15 176 lb 12.9 oz (80.2 kg)    Ideal Body Weight:  75.45 kg  BMI:  Body mass index is 25.37 kg/(m^2).  Estimated Nutritional Needs:    Kcal:  2000-2200  Protein:  100-115 grams  Fluid:  >2.0 L  EDUCATION NEEDS:   No education needs identified at this time  Aaryana Betke A. Jimmye Norman, RD, LDN, CDE Pager: [phone] After hours Pager: [phone]

## 2015-06-27 NOTE — Progress Notes (Signed)
Patient Name: Cristian Moss Date of Encounter: 06/27/2015  Principal Problem:   Sepsis [facility]) Active Problems:   Essential hypertension   Seizure disorder ([place])   Elevated troponin   Normocytic anemia   Tobacco abuse   CKD (chronic kidney disease), stage III   Cavitating mass in right upper lung lobe   Left adrenal mass (HCC)   Coronary artery disease due to lipid rich plaque   Claudication (HCC)   Dehydration   Low back pain   Chronic obstructive pulmonary disease (HCC)   PAD (peripheral artery disease) (HCC)   Back pain   Hypotension   CAD in native artery   Acute respiratory failure with hypoxia (HCC)   Acute diastolic heart failure ([place])   Postobstructive pneumonia   Patient Profile: Cristian Moss is a 70 y.o. male with a history of hypertension, tobacco abuse, and stage  II-III chronic kidney disease. 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 He has a RUL mass which was recently found in Feb. 2017.  He had left heart cath on 05/06/15, he received DES to RCA. He was admitted for LE angiography on 06/20/15.  He had hypotension, leukocytosis, and mildly elevated lactate following the procedure. Found to have cavitating lung mass.      SUBJECTIVE:  Underwent biopsy of lung mass on Friday. Post procedure developed pulmonary edema and resp failure. Improved with IV lasix. Diuresis continued over the weekend, negative fluid balance, still has crackles at the right lung, feels SOB. On O2 via nasal cannula only. PRBC yesterday.  OBJECTIVE Filed Vitals:   06/27/15 0400 06/27/15 0500 06/27/15 0743 06/27/15 0816  BP: 124/72  149/95   Pulse: 99  102   Temp:   99.9 F (37.7 C)   TempSrc:   Oral   Resp: 25  24   Height:      Weight:  176 lb 12.9 oz (80.2 kg)    SpO2: 91%  93% 96%    Intake/Output Summary (Last 24 hours) at 06/27/15 0830 Last data filed at 06/27/15 0548  Gross per 24 hour  Intake    1153 ml  Output   2350 ml  Net  -1197 ml   Filed Weights   06/24/15 1920 06/26/15 0400 06/27/15 0500  Weight: 176 lb 12.9 oz (80.2 kg) 176 lb 5.9 oz (80 kg) 176 lb 12.9 oz (80.2 kg)   PHYSICAL EXAM General: Cachetic. Frail appearing Head: Normocephalic, atraumatic.  Neck: Supple without bruits, JVP 8-9 Lungs:  clear Heart: RRR, S1, S2, no S3, S4, or murmur; no rub. Abdomen: Soft, non-tender, non-distended, BS + x 4.  Extremities: No cyanosis, no edema. + clubbing Neuro: Alert and oriented X 3. Moves all extremities spontaneously. Psych: Normal affect.  LABS: CBC:  Recent Labs  06/26/15 0226 06/27/15 0246  WBC 22.2* 25.6*  NEUTROABS  --  20.3*  HGB 7.0* 8.7*  HCT 22.6* 26.7*  MCV 89.3 89.6  PLT 295 274   INR: No results for input(s): INR in the last 72 hours. Basic Metabolic Panel:  Recent Labs  06/26/15 0226 06/27/15 0246  NA 139 138  K 4.2 4.5  CL 109 106  CO2 22 25  GLUCOSE 114* 109*  BUN 8 8  CREATININE 0.93 1.02  CALCIUM 7.5* 7.8*   Cardiac Enzymes:No results for input(s): CKTOTAL, CKMB, CKMBINDEX, TROPONINI in the last 72 hours.  Thyroid Function Tests:No results for input(s):  TSH, T4TOTAL, T3FREE, THYROIDAB in the last 72 hours.  Invalid input(s): FREET3   Current facility-administered medications:  .  acetaminophen (TYLENOL) tablet 975 mg, 975 mg, Oral, Q6H PRN, Minus Breeding, MD, 975 mg at 06/22/15 1942 .  albuterol (PROVENTIL) (2.5 MG/3ML) 0.083% nebulizer solution 2.5 mg, 2.5 mg, Inhalation, Q3H PRN, Rahul P Desai, PA-C, 2.5 mg at 06/26/15 0328 .  antiseptic oral rinse (CPC / CETYLPYRIDINIUM CHLORIDE 0.05%) solution 7 mL, 7 mL, Mouth Rinse, BID, Almyra Deforest, PA, 7 mL at 06/26/15 2158 .  arformoterol (BROVANA) nebulizer solution 15 mcg, 15 mcg, Nebulization, BID, Rahul P Desai, PA-C, 15 mcg at 06/27/15 6301 .  aspirin EC tablet 81 mg, 81 mg, Oral, Daily, Troy Sine, MD, 81 mg at 06/26/15 1100 .  atorvastatin (LIPITOR) tablet 80 mg, 80 mg,  Oral, q1800, Lorretta Harp, MD, 80 mg at 06/25/15 1709 .  bisacodyl (DULCOLAX) EC tablet 5 mg, 5 mg, Oral, Daily PRN, Lorretta Harp, MD, 5 mg at 06/24/15 1545 .  bromocriptine (PARLODEL) tablet 2.5 mg, 2.5 mg, Oral, BID, Lorretta Harp, MD, 2.5 mg at 06/26/15 2157 .  budesonide (PULMICORT) nebulizer solution 0.5 mg, 0.5 mg, Nebulization, BID, Rahul P Desai, PA-C, 0.5 mg at 06/27/15 0816 .  clopidogrel (PLAVIX) tablet 75 mg, 75 mg, Oral, Daily, Arbutus Leas, NP, 75 mg at 06/26/15 1101 .  dronabinol (MARINOL) capsule 2.5 mg, 2.5 mg, Oral, BID AC, Venetia Maxon Rama, MD, 2.5 mg at 06/26/15 1803 .  fentaNYL (SUBLIMAZE) injection 25-50 mcg, 25-50 mcg, Intravenous, Q4H PRN, Nishant Dhungel, MD, 50 mcg at 06/26/15 1623 .  ferrous sulfate tablet 325 mg, 325 mg, Oral, BID WC, Lorretta Harp, MD, 325 mg at 06/26/15 0916 .  folic acid (FOLVITE) tablet 1 mg, 1 mg, Oral, Daily, Troy Sine, MD, 1 mg at 06/26/15 1101 .  furosemide (LASIX) tablet 40 mg, 40 mg, Oral, Daily, Shaune Pascal Bensimhon, MD .  levETIRAcetam (KEPPRA) tablet 1,500 mg, 1,500 mg, Oral, Q12H, Lorretta Harp, MD, 1,500 mg at 06/26/15 2156 .  levofloxacin (LEVAQUIN) tablet 750 mg, 750 mg, Oral, Daily, Myrene Galas, RPH, 750 mg at 06/26/15 1804 .  Living Better with Heart Failure Book, , Does not apply, Once, Lorretta Harp, MD .  morphine 2 MG/ML injection 2 mg, 2 mg, Intravenous, Once, Louellen Molder, MD, Stopped at 06/24/15 1530 .  nicotine (NICODERM CQ - dosed in mg/24 hours) patch 14 mg, 14 mg, Transdermal, Daily, Lorretta Harp, MD, 14 mg at 06/26/15 1101 .  nitroGLYCERIN (NITROSTAT) SL tablet 0.4 mg, 0.4 mg, Sublingual, Q5 min PRN, Lorretta Harp, MD .  ondansetron [facility]) injection 4 mg, 4 mg, Intravenous, Q6H PRN, Lorretta Harp, MD, 4 mg at 06/20/15 1445 .  oxyCODONE (Oxy IR/ROXICODONE) immediate release tablet 10 mg, 10 mg, Oral, Q4H PRN, Venetia Maxon Rama, MD, 10 mg at 06/27/15 0544 .  pantoprazole (PROTONIX) EC  tablet 40 mg, 40 mg, Oral, BID, Lorretta Harp, MD, 40 mg at 06/26/15 2156 .  phenytoin (DILANTIN) chewable tablet 50 mg, 50 mg, Oral, QHS, Lorretta Harp, MD, 50 mg at 06/26/15 2157 .  phenytoin (DILANTIN) ER capsule 300 mg, 300 mg, Oral, QHS, Lorretta Harp, MD, 300 mg at 06/26/15 2157 .  polyethylene glycol (MIRALAX / GLYCOLAX) packet 17 g, 17 g, Oral, Daily, Nishant Dhungel, MD, 17 g at 06/26/15 1000 .  potassium chloride (K-DUR) CR tablet 20 mEq, 20 mEq, Oral, BID, Shaune Pascal Bensimhon,  MD, 20 mEq at 06/26/15 2157   TELE:   NSR     Radiology/Studies: No results found.  Current Medications:  . antiseptic oral rinse  7 mL Mouth Rinse BID  . arformoterol  15 mcg Nebulization BID  . aspirin EC  81 mg Oral Daily  . atorvastatin  80 mg Oral q1800  . bromocriptine  2.5 mg Oral BID  . budesonide (PULMICORT) nebulizer solution  0.5 mg Nebulization BID  . clopidogrel  75 mg Oral Daily  . dronabinol  2.5 mg Oral BID AC  . ferrous sulfate  325 mg Oral BID WC  . folic acid  1 mg Oral Daily  . furosemide  40 mg Oral Daily  . levETIRAcetam  1,500 mg Oral Q12H  . levofloxacin  750 mg Oral Daily  . Living Better with Heart Failure Book   Does not apply Once  .  morphine injection  2 mg Intravenous Once  . nicotine  14 mg Transdermal Daily  . pantoprazole  40 mg Oral BID  . phenytoin  50 mg Oral QHS  . phenytoin  300 mg Oral QHS  . polyethylene glycol  17 g Oral Daily  . potassium chloride  20 mEq Oral BID      ASSESSMENT AND PLAN: Principal Problem:   Sepsis ([place]) Active Problems:   Essential hypertension   Seizure disorder (HCC)   Elevated troponin   Normocytic anemia   Tobacco abuse   CKD (chronic kidney disease), stage III   Cavitating mass in right upper lung lobe   Left adrenal mass (HCC)   Coronary artery disease due to lipid rich plaque   Claudication (HCC)   Dehydration   Low back pain   Chronic obstructive pulmonary disease (HCC)   PAD (peripheral artery disease)  (HCC)   Back pain   Hypotension   CAD in native artery   Acute respiratory failure with hypoxia (HCC)   Acute diastolic heart failure (HCC)   Postobstructive pneumonia    1. Acute hypoxic respiratory failure post procedure 3/31 due to acute diastolic HF -improved with diuresis. IV lasix over the weekend, switched to PO today, we will follow I&O, he is now minimally fluid overloaded, I will order a chest X ray this am. - Crea normal  -EF 45-50% by echo 2/17  2. PAD: - doppler 05/31/2015 right ABI of 0.72 with high grade disease in his R SFA and L ABI 0.59 with occluded L  SFA - LE angiography 06/20/2015 [facility] 1 directional atherectomy with drug eluding balloon angioplasty R SFA  3. SIRS with leukocytosis:         --improved. IM managing  4. Cavitating mass in RUL, note to have large 8.9x6.8 xm lung mass in posterior R upper lobe: - s/p biopsy 3/31. Back on Plavix  - path pending. 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 CAD s/p DES to RCA: - cath 05/06/2015 with 80% prox RCA treated with DES, 65% 1st Marginal treated medically.  - echo 05/04/2015 EF 45-50%, hypokinesis of apicalanteroseptal myocardium, PA peak pressure 66mHg  6. Worsening anemia:  - suspect possibly due to his malignancy. Ct negative for bleed.     - started on po iron. Had transfusion yesterday, Hb improved 7--> 8.3  7. L adrenal mass: recent CT of chest 3/15, 7.1x6.1 cm large L adrenal mass increased in size, suspicious for metastasis from lung mass   Saory Carriero H,MD 8:30 AM

## 2015-06-27 NOTE — Progress Notes (Signed)
Physical Therapy Treatment Patient Details Name: Cristian Moss MRN: [id number] DOB: May 13, 1945 Today's Date: 06/27/2015    History of Present Illness Patient is a 70 yo male admitted 06/20/15 with claudication and is s/p Rt LE angiography, angioplasty Rt SFA.  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  CT showed L1 fracture.     PMH:  HTN, tobacco use, CKD, seizures, CAD, cardiac stent, DOE    PT Comments    Patient tolerated EOB activity with assist. Spent increased time at EOB and performed trunk control exercises. Patient remains limited by low back pain and demonstrates significant left lateral lean in sitting balance. Will continue to see and progress as tolerated. highly recommend HHPT.  Follow Up Recommendations  SNF;Supervision/Assistance - 24 hour     Equipment Recommendations  None recommended by PT    Recommendations for Other Services       Precautions / Restrictions Precautions Precautions: Fall Restrictions Weight Bearing Restrictions: No    Mobility  Bed Mobility Overal bed mobility: Needs Assistance;+2 for physical assistance Bed Mobility: Supine to Sit;Sit to Supine     Supine to sit: Max assist;+2 for physical assistance Sit to supine: Max assist;+2 for physical assistance   General bed mobility comments: VCs for initiation and positioning, manual assist for hand palcement. Patient with increased effort and time to perform. Assist to elevate trunk to EOB and rotate hips. patient with definitive left lateral lean.  Transfers                 General transfer comment: NT  Ambulation/Gait                 Stairs            Wheelchair Mobility    Modified Rankin (Stroke Patients Only)       Balance Overall balance assessment: Needs assistance Sitting-balance support: Bilateral upper extremity supported;Feet supported Sitting balance-Leahy Scale: Zero (to poor) Sitting balance -  Comments: Requires max assist to maintain balance. Postural control: Left lateral lean;Posterior lean                          Cognition Arousal/Alertness: Awake/alert Behavior During Therapy: Flat affect Overall Cognitive Status: Within Functional Limits for tasks assessed                      Exercises Other Exercises Other Exercises: sitting EOB trunk control exercises, fwd flexion/posterior extension  Other Exercises: lateral leans Other Exercises: weight shifts Other Exercises: cervical extension PROM    General Comments        Pertinent Vitals/Pain Pain Assessment: 0-10 Faces Pain Scale: Hurts whole lot Pain Location: back Pain Descriptors / Indicators: Aching Pain Intervention(s): Premedicated before session;Monitored during session;Limited activity within patient's tolerance    Home Living                      Prior Function            PT Goals (current goals can now be found in the care plan section) Acute Rehab PT Goals Patient Stated Goal: None stated PT Goal Formulation: With patient Time For Goal Achievement: Jul 27, 2015 Potential to Achieve Goals: Fair Progress towards PT goals: Progressing toward goals    Frequency  Min 3X/week    PT Plan Current plan remains appropriate    Co-evaluation  End of Session Equipment Utilized During Treatment: Oxygen Activity Tolerance: Patient limited by fatigue;Patient limited by pain Patient left: in bed;with call bell/phone within reach;with bed alarm set;with SCD's reapplied     Time: 3710-6269 PT Time Calculation (min) (ACUTE ONLY): 21 min  Charges:  $Therapeutic Activity: 8-22 mins                    G CodesDuncan Dull 17-Jul-2015, 2:17 PM Alben Deeds, [place] DPT  [phone]

## 2015-06-27 NOTE — Progress Notes (Signed)
Progress Note   [name] NOM:[id number] DOB: 12-29-1945 DOA: 06/20/2015 PCP: [name], MD   Brief Narrative:   Cristian Moss is an 70 y.o. male recently admitted with seizures and found to have right upper lobe mass, severe peripheral artery disease admitted for right angiography on 06/20/2015 with atherectomy and drug-eluting balloon angioplasty of right SFA. Postoperatively patient became hypertensive and had leukocytosis with elevated lactate of 2. Placed on empiric antibiotic and given IV hydration. On the evening of 3/28 hemoglobin dropped to 7.3 and given 1 unit PRBC. While being transfused he had low-grade fever and subsequently became persistently hypotensive, was transferred to ICU. Patient was placed on IV hydrocortisone and given IV fluid resuscitation, improved subsequently and transferred to telemetry. On 06/24/15, the patient was transferred back to the SDU secondary to acute hypoxic respiratory failure requiring BiPAP.  Assessment/Plan:   Sepsis with hypotension with hypovolemia/dehydration status post procedure (abdominal aortogram with bifemoral runoff and angioplasty of the right SFA)/probable postobstructive pneumonia Initial concern was for infection (lactic acid and Pro calcitonin both elevated on admission), but cultures have remained negative. The patient has improved on empiric antibiotics (status post 5 days of Zosyn/vancomycin, now on Levaquin) to treat a possible postobstructive pneumonia given right upper lobe mass, stress dose steroids and IV fluids. He remains off blood pressure medications. Cortisol was within normal limits at 12.8. Stress dose steroids have now been weaned and the patient's blood pressure has remained stable. Continue Levaquin. WBC continues to rise. May need to broaden antibiotics.  Cavitating mass in right upper lung lobe suspicious for malignancy Large cavitating RUL mass measuring 8.96.8cm seen on CT scan 05/04/15 which is  suspicious for malignancy. History of >40-pack-year smoking. Status post CT-guided biopsy 06/24/15. Results pending.  Large left adrenal mass (8cm) Needs PET-CT as outpatient. Suspicion for malignancy/ ? Lung metastases. Mass has increased in size on recent CT compared to one month back. Needs oncology consult depending upon biopsy results.  Acute on chronic hypoxic respiratory failure/acute diastolic CHF Multifactorial. Initially likely from sepsis, acute diastolic heart failure related to volume overload in the setting for need for fluid volume resuscitation. Developed acute respiratory failure overnight 06/24/15 and was transferred back to the stepdown unit placed on BiPAP. He was diuresed further and was weaned back to nasal cannula oxygen. He remains volume overloaded on Lasix 40 mg twice a day. EF 45-50 percent by echocardiogram done 04/2015. Cardiology following.  COPD Continue Brovana and budesonide. Continue albuterol as needed for wheezing.  Severe peripheral artery disease High-grade disease a SFA status post angiography, atherectomy with drug-eluting balloon angioplasty of R SFA.  CAD with recent cardiac cath with 80% proximal RCA stenosis treated with DES/elevated troponin Continue aspirin and statin. Plavix held for lung biopsy. Resumed 06/24/15. Had mild elevation of troponin 1. Cardiology following.  Iron deficiency anemia Status post 3 units of PRBCs. Continue iron supplement. Fecal occult blood testing pending.  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 Follows with outpatient neurology.  Tobacco abuse Counseled strongly on cessation. Nicotine patch.  Chronic kidney disease stage III Stable.  Low back pain Patient be due to L1 compression fracture seen on imaging. Pain control and PT ordered. Bowel regimen started.  Non anion gap Metabolic acidosis Resolved. Discontinue bicarbonate supplementation.  Fatigue and  protein calorie malnutrition, likely severe Patient is very weak and has had a poor appetite for a long time. He is losing a significant amount of weight and  has muscle wasting. We'll start him on Marinol for appetite stimulation and obtain dietitian consultation for further recommendations.   Family Communication/Anticipated D/C date and plan/Code Status   Family Communication: Son-in-law at the bedside. Disposition Plan/date: From home, but is significantly deconditioned and will likely need SNF. Awaiting final biopsy results and medical stability prior to discharge. Code Status: Full code.   Procedures and diagnostic studies:   Ct Abdomen Pelvis Wo Contrast  06/21/2015  CLINICAL DATA:  Back pain. Catheterization yesterday. Evaluate for retroperitoneal hematoma. EXAM: CT ABDOMEN AND PELVIS WITHOUT CONTRAST TECHNIQUE: Multidetector CT imaging of the abdomen and pelvis was performed following the standard protocol without IV contrast. COMPARISON:  05/05/2015 FINDINGS: Lower chest and abdominal wall: Fatty enlargement of the bilateral inguinal canal. There is a small right pleural effusion which is newly seen recent chest CT, with atelectatic type opacity in the right lower lobe. Hepatobiliary: Low-density foci best in the liver seen on prior enhanced study are not visible by this technique.Cholelithiasis. No signs of obstruction or inflammation. Pancreas: There is a 6 mm low-density mass in the uncinate process, doubtful clinical significance given the lung and left adrenal findings. This will be followed with the adrenal mass. Spleen: Unremarkable. Adrenals/Urinary Tract: Continued enlargement of a left adrenal mass which has cystic density but mural complexity on prior enhanced CT. Mass measures up to 85 mm today. Cortical retention and continued excretion of IV contrast from catheterization yesterday. Although initially concerning for ATN there is stable creatinine clearance. No hydronephrosis.  Negative bladder. Reproductive:No pathologic findings. Stomach/Bowel: Suspect proximal gastric diverticulum containing high-density material. Prominent volume of formed stool. No obstruction. No appendicitis. Vascular/Lymphatic: No retroperitoneal hematoma. Diffuse atherosclerosis No mass or adenopathy. Peritoneal: No ascites or pneumoperitoneum. Musculoskeletal: L1 superior endplate fracture with mild depression, stable from chest CT 06/08/2015. No visualized osseous metastasis. Chronic L5 right pars defect. IMPRESSION: 1. Negative for retroperitoneal hematoma. 2. Trace right pleural effusion with lower lobe atelectasis. 3. Continued growth of the cystic left adrenal mass, now ~8 cm, metastasis favored. 4. L1 superior endplate fracture with stable depression since 06/08/2015. 5. Probable developing constipation. Electronically Signed   By: Monte Fantasia M.D.   On: 06/21/2015 12:54   Dg Lumbar Spine Complete  06/08/2015  CLINICAL DATA:  Low back pain for several days.  No injury. EXAM: LUMBAR SPINE - COMPLETE 4+ VIEW COMPARISON:  CT abdomen and pelvis 05/05/2015 FINDINGS: There are 5 non rib-bearing lumbar type vertebral bodies. Vertebral alignment is normal. Vertebral body heights are preserved. There is minimal disc space narrowing at L5-S1, with vacuum disc phenomenon noted on the prior CT. Disc space heights are preserved elsewhere. A right L5 pars defect is noted. Atherosclerotic aortic calcification is noted. IMPRESSION: 1. No acute osseous abnormality identified. 2. 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 Minimal L5-S1 disc narrowing. Electronically Signed   By: Logan Bores M.D.   On: 06/08/2015 13:07   Ct Chest Wo Contrast  06/08/2015  CLINICAL DATA:  70 year old male with cavitary right upper lobe lung mass presenting for follow-up status post antibiotic therapy. EXAM: CT CHEST WITHOUT CONTRAST TECHNIQUE: Multidetector CT imaging of the chest was performed following the standard protocol without IV  contrast. COMPARISON:  05/04/2015 chest CT . FINDINGS: Mediastinum/Nodes: Normal heart size. No pericardial fluid/thickening. Extensive coronary atherosclerosis. Atherosclerotic nonaneurysmal thoracic aorta. Stable dilated main pulmonary artery (3.1 cm diameter). Normal visualized thyroid. Normal esophagus. No axillary adenopathy. No pathologically enlarged mediastinal lymph nodes. Stable mildly enlarged 1.4 cm right hilar node (series 2/  image 29). No gross left hilar adenopathy on this noncontrast study. Lungs/Pleura: No pneumothorax. No pleural effusion. There are layering inspissated secretions throughout the lower trachea and bilateral mainstem bronchi. There is mild centrilobular emphysema and diffuse bronchial wall thickening. There is a large 8.9 x 6.8 cm lung mass in the posterior right upper lobe (series 3/ image 25), which abuts the visceral pleural and right major fissure, previously measuring 7.6 x 5.8 cm on 05/04/2015, significantly increased in size. Please note that the mass significantly distorts the superior aspect of the right major fissure in the mass may traverse the fissure into the superior segment of the right lower lobe. There is patchy consolidation, ground-glass opacity and reticulation throughout the dependent and basilar right upper lobe surrounding the right upper lobe mass, not definitely changed. Solid 1.2 cm lingular pulmonary nodule (series 3/image 35), previously 1.1 cm on 05/04/2015 and 0.8 cm on 01/17/2009, not definitely changed in the interval. New atelectasis in the posterior lingula. No new significant pulmonary nodules. Upper abdomen: Partial visualization of large left adrenal mass measuring at least 7.1 x 6.1 cm, previously 6.3 x 5.8 cm, increased in size. Musculoskeletal: There is new lucency and irregularity of the visualize superior endplate of the L1 vertebral body, cannot exclude L1 fracture. IMPRESSION: 1. 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 2. No appreciable change in nonspecific patchy consolidation, ground-glass opacity and reticulation throughout the posterior/basilar right upper lobe surrounding the lung mass, which could represent alveolar hemorrhage and/or lymphangitic tumor spread. 3. Solid 1.2 cm lingular pulmonary nodule, slowly growing since 2010, indeterminate. 4. Stable mild right hilar lymphadenopathy, suspicious for nodal metastasis. No mediastinal lymphadenopathy. 5. 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 6. Recommend further evaluation of these findings with PET-CT. 7. New lucency and irregularity of the visualized superior endplate of the L1 vertebral body, cannot exclude L1 vertebral fracture. Consider correlation with lumbar spine MRI as clinically warranted. 8. Additional findings include coronary atherosclerosis, chronic main pulmonary artery dilation suggesting pulmonary arterial hypertension and mild emphysema. Electronically Signed   By: Ilona Sorrel M.D.   On: 06/08/2015 13:03   Ct Biopsy  06/24/2015  INDICATION: Enlarging posterior right upper lobe mass EXAM: CT-GUIDED BIOPSY LARGE RIGHT UPPER LOBE CENTRALLY NECROTIC MASS MEDICATIONS: 1% LIDOCAINE LOCALLY ANESTHESIA/SEDATION: 1.0 mg IV Versed; 25 mcg IV Fentanyl Moderate Sedation Time:  20 MINUTES The patient was continuously monitored during the procedure by the interventional radiology nurse under my direct supervision. PROCEDURE: The procedure, risks, benefits, and alternatives were explained to the patient. Questions regarding the procedure were encouraged and answered. The patient understands and consents to the procedure. The right posterior back was prepped with ChloraPrep  in a sterile fashion, and a sterile drape was applied covering the operative field. A sterile gown and sterile gloves were used for the procedure. Previous imaging reviewed. Patient positioned right side down decubitus. Noncontrast localization CT performed. The large posterior right upper lobe mass was localized. Under sterile conditions and local anesthesia, a 17 gauge 6.8 cm access needle was advanced from a posterior intercostal approach to the periphery of the right upper lobe mass. Needle position confirmed with CT. 18 gauge core biopsies obtained and placed in formalin. Needle tract embolized with the biosentry device  to aide in pneumothorax prevention. Postprocedure imaging demonstrates no complication. Patient tolerated the procedure well without complication. Vital sign monitoring by nursing staff during the procedure will continue as patient is in the special procedures unit for post procedure observation. FINDINGS: The images document guide needle placement within the large posterior right upper lobe mass. Post biopsy images demonstrate no effusion or pneumothorax. COMPLICATIONS: None immediate. IMPRESSION: Successful CT-guided core biopsy of the right upper lobe mass. Electronically Signed   By: Jerilynn Mages.  Shick M.D.   On: 06/24/2015 11:05   Dg Chest Port 1 View  06/24/2015  CLINICAL DATA:  Cavitating right upper lobe lung mass. EXAM: PORTABLE CHEST 1 VIEW COMPARISON:  06/22/2015 and CT 06/08/2015 FINDINGS: Examination demonstrates adequate lung volumes with no significant change in patient's known large right lung mass which has undergone recent percutaneous biopsy. There is less interstitial density adjacent to the mass. No significant effusion. No evidence of pneumothorax. Cardiomediastinal silhouette and remainder of the exam is unchanged. IMPRESSION: Stable known large right upper lobe lung mass post recent percutaneous biopsy. Near resolution of previously seen heterogeneous density adjacent the mass.  Electronically Signed   By: Marin Olp M.D.   On: 06/24/2015 14:03   Dg Chest Port 1 View  06/22/2015  CLINICAL DATA:  Respiratory failure . EXAM: PORTABLE CHEST 1 VIEW COMPARISON:  06/20/2015.  CT 06/08/2015. FINDINGS: Mediastinum is stable. Heart size normal. Right upper lobe infiltrate/mass again noted. Again this could represent a pulmonary abscess or neoplasm. Improvement of right upper lobe atelectasis prior exam. Mild bibasilar atelectasis. No pleural effusion or pneumothorax . IMPRESSION: 1. Persistent rounded infiltrate/mass right upper lobe. Again this could represent a pulmonary abscess or neoplasm. Improvement of right upper lobe atelectasis noted on today's exam. 2. Mild bibasilar atelectasis. Electronically Signed   By: Marcello Moores  Register   On: 06/22/2015 07:14   Dg Chest Port 1 View  06/20/2015  CLINICAL DATA:  Tachycardia, chills, elevated white blood cells EXAM: PORTABLE CHEST 1 VIEW COMPARISON:  06/08/2015 FINDINGS: There is right upper lobe consolidation. There is no pleural effusion or pneumothorax. The heart and mediastinal contours are unremarkable. The osseous structures are unremarkable. IMPRESSION: Right upper lobe consolidation most concerning for pneumonia. Given the masslike area in the right upper lobe on previous CT of the chest dated 06/09/2015 this is concerning for possible intrapulmonary abscess. Electronically Signed   By: Kathreen Devoid   On: 06/20/2015 16:31   Dg Chest Port 1v Same Day  06/24/2015  CLINICAL DATA:  Congestive heart failure. Increase shortness of breath. Right lung mass. Biopsy earlier today. EXAM: PORTABLE CHEST 1 VIEW COMPARISON:  Plain films, including earlier today at 1349 hours. FINDINGS: Patient rotated right. Normal heart size with a tortuous thoracic aorta. No pleural effusion or pneumothorax. Right upper lobe lung mass again identified. There is patchy interstitial and airspace disease within the right upper, right lower, and left lower lobes. New.  IMPRESSION: Multifocal interstitial and airspace disease, worse on the right. This is superimposed upon a known right upper lobe lung mass. Given time course of development, favored to represent pulmonary edema. Aspiration pneumonitis could have a similar appearance. No pneumothorax or other acute complication of biopsy. The right upper lobe opacity is felt unlikely to represent biopsy induced hemorrhage, given development of multi focal airspace disease. Electronically Signed   By: Abigail Miyamoto M.D.   On: 06/24/2015 19:12    Medical Consultants:    Pulmonology  Interventional Radiology  Anti-Infectives:   Levaquin 06/24/15---> Vancomycin/Zosyn  06/20/15---> 06/24/15   Subjective:   [name] continues to be weak but reports that his appetite has improved slightly. Remains tachypneic. Has occasional pain. Mildly short of breath at times.  Objective:    Filed Vitals:   06/27/15 0000 06/27/15 0400 06/27/15 0500 06/27/15 0743  BP: 124/64 124/72  149/95  Pulse: 98 99  102  Temp:    99.9 F (37.7 C)  TempSrc:    Oral  Resp: '24 25  24  '$ Height:      Weight:   80.2 kg (176 lb 12.9 oz)   SpO2: 96% 91%  93%    Intake/Output Summary (Last 24 hours) at 06/27/15 0745 Last data filed at 06/27/15 0548  Gross per 24 hour  Intake   1273 ml  Output   2350 ml  Net  -1077 ml   Filed Weights   06/24/15 1920 06/26/15 0400 06/27/15 0500  Weight: 80.2 kg (176 lb 12.9 oz) 80 kg (176 lb 5.9 oz) 80.2 kg (176 lb 12.9 oz)    Exam: Gen:  Frail, elderly with weakness apparent Cardiovascular:  RRR, No M/R/G Respiratory:  Lungs with faint rhonchi bilaterally Gastrointestinal:  Abdomen soft, NT/ND, + BS Extremities:  Clubbing present, no edema   Data Reviewed:    Labs: Basic Metabolic Panel:  Recent Labs Lab 06/22/15 0342 06/23/15 0420 06/24/15 0345 06/25/15 0251 06/26/15 0226 06/27/15 0246  NA 141  139 139 140 140 139 138  K 4.0  3.9 3.8 3.8 3.8 4.2 4.5  CL 117*  115* 116*  115* 114* 109 106  CO2 17*  17* 15* 16* 18* 22 25  GLUCOSE 116*  114* 175* 102* 102* 114* 109*  BUN '17  18 17 12 10 8 8  '$ CREATININE 1.11  1.07 1.03 0.98 0.73 0.93 1.02  CALCIUM 7.4*  7.4* 7.4* 7.6* 7.5* 7.5* 7.8*  MG 1.5*  --   --   --   --   --   PHOS 2.9  --   --   --   --   --    GFR Estimated Creatinine Clearance: 68.6 mL/min (by C-G formula based on Cr of 1.02). Liver Function Tests:  Recent Labs Lab 06/20/15 1926  AST 68*  ALT 27  ALKPHOS 82  BILITOT 0.3  PROT 6.0*  ALBUMIN 1.9*   Coagulation profile  Recent Labs Lab 06/20/15 1926 06/24/15 0345  INR 1.58* 1.44    CBC:  Recent Labs Lab 06/20/15 1926  06/23/15 0420 06/24/15 0345 06/25/15 0251 06/26/15 0226 06/27/15 0246  WBC 23.9*  < > 33.6* 29.3* 20.6* 22.2* 25.6*  NEUTROABS 19.5*  --   --   --   --   --  20.3*  HGB 7.8*  < > 7.1* 7.5* 7.1* 7.0* 8.7*  HCT 24.6*  < > 22.0* 24.8* 22.5* 22.6* 26.7*  MCV 90.8  < > 89.8 90.5 89.3 89.3 89.6  PLT 309  < > 263 330 324 295 274  < > = values in this interval not displayed. Cardiac Enzymes:  Recent Labs Lab 06/22/15 0342  TROPONINI 0.06*   CBG:  Recent Labs Lab 06/22/15 0222  GLUCAP 112*   Sepsis Labs:  Recent Labs Lab 06/20/15 1926 06/20/15 2154  06/22/15 0342 06/23/15 0420 06/24/15 0345 06/25/15 0251 06/26/15 0226 06/27/15 0246  PROCALCITON 1.27  --   --  0.75 0.59 0.46  --   --   --   WBC 23.9*  --   < > 19.8* 33.6*  29.3* 20.6* 22.2* 25.6*  LATICACIDVEN 2.2* 2.0  --  0.8  --   --   --   --   --   < > = values in this interval not displayed. Microbiology Recent Results (from the past 240 hour(s))  Culture, blood (x 2)     Status: None   Collection Time: 06/20/15  7:35 PM  Result Value Ref Range Status   Specimen Description BLOOD BLOOD LEFT FOREARM  Final   Special Requests BOTTLES DRAWN AEROBIC AND ANAEROBIC 5CC  Final   Culture NO GROWTH 5 DAYS  Final   Report Status 06/25/2015 FINAL  Final  Culture, blood (x 2)     Status:  None   Collection Time: 06/20/15  7:50 PM  Result Value Ref Range Status   Specimen Description BLOOD RIGHT ARM  Final   Special Requests BOTTLES DRAWN AEROBIC AND ANAEROBIC 5CC  Final   Culture NO GROWTH 5 DAYS  Final   Report Status 06/25/2015 FINAL  Final  Urine culture     Status: None   Collection Time: 06/21/15 12:22 AM  Result Value Ref Range Status   Specimen Description URINE, CLEAN CATCH  Final   Special Requests Normal  Final   Culture 4,000 COLONIES/mL INSIGNIFICANT GROWTH  Final   Report Status 06/22/2015 FINAL  Final  MRSA PCR Screening     Status: None   Collection Time: 06/22/15  2:13 AM  Result Value Ref Range Status   MRSA by PCR NEGATIVE NEGATIVE Final    Comment:        The GeneXpert MRSA Assay (FDA approved for NASAL specimens only), is one component of a comprehensive MRSA colonization surveillance program. It is not intended to diagnose MRSA infection nor to guide or monitor treatment for MRSA infections.   MRSA PCR Screening     Status: None   Collection Time: 06/24/15  7:22 PM  Result Value Ref Range Status   MRSA by PCR NEGATIVE NEGATIVE Final    Comment:        The GeneXpert MRSA Assay (FDA approved for NASAL specimens only), is one component of a comprehensive MRSA colonization surveillance program. It is not intended to diagnose MRSA infection nor to guide or monitor treatment for MRSA infections.      Medications:   . antiseptic oral rinse  7 mL Mouth Rinse BID  . arformoterol  15 mcg Nebulization BID  . aspirin EC  81 mg Oral Daily  . atorvastatin  80 mg Oral q1800  . bromocriptine  2.5 mg Oral BID  . budesonide (PULMICORT) nebulizer solution  0.5 mg Nebulization BID  . clopidogrel  75 mg Oral Daily  . dronabinol  2.5 mg Oral BID AC  . ferrous sulfate  325 mg Oral BID WC  . folic acid  1 mg Oral Daily  . furosemide  40 mg Oral Daily  . levETIRAcetam  1,500 mg Oral Q12H  . levofloxacin  750 mg Oral Daily  . Living Better with  Heart Failure Book   Does not apply Once  .  morphine injection  2 mg Intravenous Once  . nicotine  14 mg Transdermal Daily  . pantoprazole  40 mg Oral BID  . phenytoin  50 mg Oral QHS  . phenytoin  300 mg Oral QHS  . polyethylene glycol  17 g Oral Daily  . potassium chloride  20 mEq Oral BID  . sodium bicarbonate  650 mg Oral BID   Continuous Infusions:  Time spent: 35 minutes.  The patient is medically complex with multiple co-morbidities and is at high risk for clinical deterioration and requires high complexity decision making.    LOS: 5 days   [facility] Pager [phone]. If unable to reach me by pager, please call my cell phone at [phone].  *Please refer to [email or web address], password [email or web address] to get updated schedule on who will round on this patient, as hospitalists switch teams weekly. If 7PM-7AM, please contact night-coverage at [email or web address], password [email or web address] for any overnight needs.  06/27/2015, 7:45 AM

## 2015-06-28 ENCOUNTER — Inpatient Hospital Stay (HOSPITAL_COMMUNITY): Payer: PPO

## 2015-06-28 ENCOUNTER — Encounter (HOSPITAL_COMMUNITY): Payer: Self-pay | Admitting: Internal Medicine

## 2015-06-28 DIAGNOSIS — J432 Centrilobular emphysema: Secondary | ICD-10-CM | POA: Diagnosis present

## 2015-06-28 DIAGNOSIS — R627 Adult failure to thrive: Secondary | ICD-10-CM | POA: Diagnosis present

## 2015-06-28 DIAGNOSIS — C3491 Malignant neoplasm of unspecified part of right bronchus or lung: Secondary | ICD-10-CM

## 2015-06-28 DIAGNOSIS — R079 Chest pain, unspecified: Secondary | ICD-10-CM

## 2015-06-28 DIAGNOSIS — C3490 Malignant neoplasm of unspecified part of unspecified bronchus or lung: Secondary | ICD-10-CM

## 2015-06-28 DIAGNOSIS — N183 Chronic kidney disease, stage 3 (moderate): Secondary | ICD-10-CM

## 2015-06-28 DIAGNOSIS — R64 Cachexia: Secondary | ICD-10-CM

## 2015-06-28 DIAGNOSIS — R0602 Shortness of breath: Secondary | ICD-10-CM

## 2015-06-28 DIAGNOSIS — M539 Dorsopathy, unspecified: Secondary | ICD-10-CM

## 2015-06-28 DIAGNOSIS — C3411 Malignant neoplasm of upper lobe, right bronchus or lung: Secondary | ICD-10-CM

## 2015-06-28 HISTORY — DX: Malignant neoplasm of unspecified part of unspecified bronchus or lung: C34.90

## 2015-06-28 HISTORY — DX: Cachexia: R64

## 2015-06-28 LAB — POCT I-STAT 3, ART BLOOD GAS (G3+)
Acid-Base Excess: 2 mmol/L (ref 0.0–2.0)
Bicarbonate: 27.9 mEq/L — ABNORMAL HIGH (ref 20.0–24.0)
O2 Saturation: 100 %
PCO2 ART: 51.1 mmHg — AB (ref 35.0–45.0)
TCO2: 29 mmol/L (ref 0–100)
pH, Arterial: 7.344 — ABNORMAL LOW (ref 7.350–7.450)
pO2, Arterial: 277 mmHg — ABNORMAL HIGH (ref 80.0–100.0)

## 2015-06-28 LAB — OCCULT BLOOD X 1 CARD TO LAB, STOOL: Fecal Occult Bld: NEGATIVE

## 2015-06-28 MED ORDER — FUROSEMIDE 10 MG/ML IJ SOLN: 40.0000 mg | Freq: Once | INTRAMUSCULAR | Status: DC

## 2015-06-28 MED ORDER — DRONABINOL 2.5 MG PO CAPS
5.0000 mg | ORAL_CAPSULE | Freq: Two times a day (BID) | ORAL | Status: DC
  Administered 2015-06-28 – 2015-06-29 (×3): 5 mg via ORAL
  Filled 2015-06-28 (×3): qty 2

## 2015-06-28 MED ORDER — METHYLPREDNISOLONE SODIUM SUCC 40 MG IJ SOLR
40.0000 mg | Freq: Two times a day (BID) | INTRAMUSCULAR | Status: DC
  Administered 2015-06-28 – 2015-06-30 (×4): 40 mg via INTRAVENOUS
  Filled 2015-06-28 (×5): qty 1

## 2015-06-28 MED ORDER — FUROSEMIDE 10 MG/ML IJ SOLN
40.0000 mg | Freq: Once | INTRAMUSCULAR | Status: AC
  Administered 2015-06-28: 40 mg via INTRAVENOUS
  Filled 2015-06-28: qty 4

## 2015-06-28 NOTE — Progress Notes (Signed)
PCCM PROGRESS NOTE  Subjective: Rt lung bx results consistent with NSCLC - adenocarcinoma.  Oncology consulted already by [facility] team.    He denies chest pain, dyspnea, sputum, or hemoptysis.  Vital signs: BP 132/79 mmHg  Pulse 103  Temp(Src) 98 F (36.7 C) (Oral)  Resp 24  Ht '5\' 10"'$  (1.778 m)  Wt 170 lb 6.4 oz (77.293 kg)  BMI 24.45 kg/m2  SpO2 95%  Intake/output: I/O last 3 completed shifts: In: 300 [P.O.:300] Out: 1775 [Urine:1775]  General: alert Neuro: follows commands HEENT: no stridor Chest: no wheeze Cardiac: regular Abd: soft, non tender Ext: no edema Skin: no rashes  CMP Latest Ref Rng 06/27/2015 06/26/2015 06/25/2015  Glucose 65 - 99 mg/dL 109(H) 114(H) 102(H)  BUN 6 - 20 mg/dL '8 8 10  '$ Creatinine 0.61 - 1.24 mg/dL 1.02 0.93 0.73  Sodium 135 - 145 mmol/L 138 139 140  Potassium 3.5 - 5.1 mmol/L 4.5 4.2 3.8  Chloride 101 - 111 mmol/L 106 109 114(H)  CO2 22 - 32 mmol/L 25 22 18(L)  Calcium 8.9 - 10.3 mg/dL 7.8(L) 7.5(L) 7.5(L)    CBC Latest Ref Rng 06/27/2015 06/26/2015 06/25/2015  WBC 4.0 - 10.5 K/uL 25.6(H) 22.2(H) 20.6(H)  Hemoglobin 13.0 - 17.0 g/dL 8.7(L) 7.0(L) 7.1(L)  Hematocrit 39.0 - 52.0 % 26.7(L) 22.6(L) 22.5(L)  Platelets 150 - 400 K/uL 274 295 324    Dg Chest Port 1 View  06/27/2015  CLINICAL DATA:  Shortness of breath. EXAM: PORTABLE CHEST 1 VIEW COMPARISON:  06/24/2015. FINDINGS: Stable cardiomediastinal silhouette. Large RIGHT upper lobe mass is redemonstrated with surrounding interstitial edema, improved after Lasix. No pneumothorax. IMPRESSION: Stable RIGHT upper lobe mass.  Improved surrounding edema. Electronically Signed   By: Staci Righter M.D.   On: 06/27/2015 10:20    Assessment/plan:  Metastatic NSCLC - adenocarcinoma. - f/u with oncology  COPD. - brovana, budesonide with prn albuterol   He has appt with Dr. Vaughan Browner scheduled for 07/13/15.  Please call if further assistance is needed while pt is in hospital.   Chesley Mires, MD [place] 06/28/2015, 11:25 AM Pager:  [phone] After 3pm call: [phone]

## 2015-06-28 NOTE — Progress Notes (Signed)
Patient Name: Cristian Moss Date of Encounter: 06/28/2015  Principal Problem:   Sepsis [facility]) Active Problems:   Essential hypertension   Seizure disorder ([place])   Elevated troponin   Normocytic anemia   Tobacco abuse   CKD (chronic kidney disease), stage III   Cavitating mass in right upper lung lobe   Left adrenal mass (HCC)   Coronary artery disease due to lipid rich plaque   Claudication (HCC)   Dehydration   Low back pain   Chronic obstructive pulmonary disease (HCC)   PAD (peripheral artery disease) (HCC)   Back pain   Hypotension   CAD in native artery   Acute respiratory failure with hypoxia (HCC)   Acute diastolic heart failure ([place])   Postobstructive pneumonia   Patient Profile: Cristian Moss is a 70 y.o. male with a history of hypertension, tobacco abuse, and stage  II-III chronic kidney disease. 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 He has a RUL mass which was recently found in Feb. 2017.  He had left heart cath on 05/06/15, he received DES to RCA. He was admitted for LE angiography on 06/20/15.  He had hypotension, leukocytosis, and mildly elevated lactate following the procedure. Found to have cavitating lung mass.      SUBJECTIVE:  Underwent biopsy of lung mass on Friday. Post procedure developed pulmonary edema and resp failure. Improved with IV lasix. Diuresis continued over the weekend, negative fluid balance, still has crackles at the right lung, feels SOB. On O2 via nasal cannula only. PRBC yesterday. He feels down, just got the diagnosis of a lung cancer. Denies chest pain or SOB.   OBJECTIVE Filed Vitals:   06/28/15 0500 06/28/15 0726 06/28/15 0757 06/28/15 0800  BP:  129/77  132/79  Pulse:  100  103  Temp:  98 F (36.7 C)    TempSrc:  Oral    Resp:  23  24  Height:      Weight: 170 lb 6.4 oz (77.293 kg)     SpO2:  98% 95% 95%    Intake/Output Summary (Last 24  hours) at 06/28/15 1020 Last data filed at 06/28/15 0627  Gross per 24 hour  Intake    180 ml  Output    775 ml  Net   -595 ml   Filed Weights   06/26/15 0400 06/27/15 0500 06/28/15 0500  Weight: 176 lb 5.9 oz (80 kg) 176 lb 12.9 oz (80.2 kg) 170 lb 6.4 oz (77.293 kg)   PHYSICAL EXAM General: Cachetic. Frail appearing Head: Normocephalic, atraumatic.  Neck: Supple without bruits, JVP 8-9 Lungs:  clear Heart: RRR, S1, S2, no S3, S4, or murmur; no rub. Abdomen: Soft, non-tender, non-distended, BS + x 4.  Extremities: No cyanosis, no edema. + clubbing Neuro: Alert and oriented X 3. Moves all extremities spontaneously. Psych: Normal affect.  LABS: CBC:  Recent Labs  06/26/15 0226 06/27/15 0246  WBC 22.2* 25.6*  NEUTROABS  --  20.3*  HGB 7.0* 8.7*  HCT 22.6* 26.7*  MCV 89.3 89.6  PLT 295 274    Recent Labs  06/26/15 0226 06/27/15 0246  NA 139 138  K 4.2 4.5  CL 109 106  CO2 22 25  GLUCOSE 114* 109*  BUN 8 8  CREATININE 0.93 1.02  CALCIUM 7.5* 7.8*    Current facility-administered medications:  .  acetaminophen (TYLENOL) tablet 975 mg, 975 mg, Oral,  Q6H PRN, Minus Breeding, MD, 975 mg at 06/22/15 1942 .  albuterol (PROVENTIL) (2.5 MG/3ML) 0.083% nebulizer solution 2.5 mg, 2.5 mg, Inhalation, Q3H PRN, Rahul P Desai, PA-C, 2.5 mg at 06/26/15 0328 .  antiseptic oral rinse (CPC / CETYLPYRIDINIUM CHLORIDE 0.05%) solution 7 mL, 7 mL, Mouth Rinse, BID, Almyra Deforest, PA, 7 mL at 06/28/15 1000 .  arformoterol (BROVANA) nebulizer solution 15 mcg, 15 mcg, Nebulization, BID, Rahul P Desai, PA-C, 15 mcg at 06/28/15 0757 .  aspirin EC tablet 81 mg, 81 mg, Oral, Daily, Troy Sine, MD, 81 mg at 06/28/15 0846 .  atorvastatin (LIPITOR) tablet 80 mg, 80 mg, Oral, q1800, Lorretta Harp, MD, 80 mg at 06/27/15 1745 .  bisacodyl (DULCOLAX) EC tablet 5 mg, 5 mg, Oral, Daily PRN, Lorretta Harp, MD, 5 mg at 06/24/15 1545 .  bromocriptine (PARLODEL) tablet 2.5 mg, 2.5 mg, Oral, BID,  Lorretta Harp, MD, 2.5 mg at 06/27/15 2224 .  budesonide (PULMICORT) nebulizer solution 0.5 mg, 0.5 mg, Nebulization, BID, Rahul P Desai, PA-C, 0.5 mg at 06/28/15 0757 .  clopidogrel (PLAVIX) tablet 75 mg, 75 mg, Oral, Daily, Arbutus Leas, NP, 75 mg at 06/28/15 0844 .  diazepam (VALIUM) tablet 5 mg, 5 mg, Oral, Q8H PRN, Venetia Maxon Rama, MD, 5 mg at 06/28/15 0159 .  dronabinol (MARINOL) capsule 2.5 mg, 2.5 mg, Oral, BID AC, Venetia Maxon Rama, MD, 2.5 mg at 06/27/15 1746 .  feeding supplement (ENSURE ENLIVE) (ENSURE ENLIVE) liquid 237 mL, 237 mL, Oral, TID BM, Christina P Rama, MD, 237 mL at 06/28/15 1000 .  fentaNYL (SUBLIMAZE) injection 25-50 mcg, 25-50 mcg, Intravenous, Q4H PRN, Nishant Dhungel, MD, 50 mcg at 06/28/15 0846 .  ferrous sulfate tablet 325 mg, 325 mg, Oral, BID WC, Lorretta Harp, MD, 325 mg at 06/28/15 0848 .  folic acid (FOLVITE) tablet 1 mg, 1 mg, Oral, Daily, Troy Sine, MD, 1 mg at 06/28/15 0846 .  furosemide (LASIX) tablet 40 mg, 40 mg, Oral, Daily, Jolaine Artist, MD, 40 mg at 06/28/15 0845 .  levETIRAcetam (KEPPRA) tablet 1,500 mg, 1,500 mg, Oral, Q12H, Lorretta Harp, MD, 1,500 mg at 06/28/15 0845 .  Living Better with Heart Failure Book, , Does not apply, Once, Lorretta Harp, MD .  morphine 2 MG/ML injection 2 mg, 2 mg, Intravenous, Once, Louellen Molder, MD, Stopped at 06/24/15 1530 .  nicotine (NICODERM CQ - dosed in mg/24 hours) patch 14 mg, 14 mg, Transdermal, Daily, Lorretta Harp, MD, 14 mg at 06/28/15 0848 .  nitroGLYCERIN (NITROSTAT) SL tablet 0.4 mg, 0.4 mg, Sublingual, Q5 min PRN, Lorretta Harp, MD .  ondansetron [facility]) injection 4 mg, 4 mg, Intravenous, Q6H PRN, Lorretta Harp, MD, 4 mg at 06/20/15 1445 .  oxyCODONE (Oxy IR/ROXICODONE) immediate release tablet 10 mg, 10 mg, Oral, Q4H PRN, Venetia Maxon Rama, MD, 10 mg at 06/27/15 2224 .  pantoprazole (PROTONIX) EC tablet 40 mg, 40 mg, Oral, BID, Lorretta Harp, MD, 40 mg at 06/28/15  0845 .  phenytoin (DILANTIN) chewable tablet 50 mg, 50 mg, Oral, QHS, Lorretta Harp, MD, 50 mg at 06/27/15 2224 .  phenytoin (DILANTIN) ER capsule 300 mg, 300 mg, Oral, QHS, Lorretta Harp, MD, 300 mg at 06/27/15 2224 .  polyethylene glycol (MIRALAX / GLYCOLAX) packet 17 g, 17 g, Oral, Daily, Nishant Dhungel, MD, 17 g at 06/28/15 0846 .  potassium chloride (K-DUR) CR tablet 20 mEq, 20 mEq, Oral, BID, Quillian Quince  R Bensimhon, MD, 20 mEq at 06/28/15 0845   TELE:   NSR     Radiology/Studies: Dg Chest Port 1 View  06/27/2015  CLINICAL DATA:  Shortness of breath. EXAM: PORTABLE CHEST 1 VIEW COMPARISON:  06/24/2015. FINDINGS: Stable cardiomediastinal silhouette. Large RIGHT upper lobe mass is redemonstrated with surrounding interstitial edema, improved after Lasix. No pneumothorax. IMPRESSION: Stable RIGHT upper lobe mass.  Improved surrounding edema. Electronically Signed   By: Staci Righter M.D.   On: 06/27/2015 10:20    Current Medications:  . antiseptic oral rinse  7 mL Mouth Rinse BID  . arformoterol  15 mcg Nebulization BID  . aspirin EC  81 mg Oral Daily  . atorvastatin  80 mg Oral q1800  . bromocriptine  2.5 mg Oral BID  . budesonide (PULMICORT) nebulizer solution  0.5 mg Nebulization BID  . clopidogrel  75 mg Oral Daily  . dronabinol  2.5 mg Oral BID AC  . feeding supplement (ENSURE ENLIVE)  237 mL Oral TID BM  . ferrous sulfate  325 mg Oral BID WC  . folic acid  1 mg Oral Daily  . furosemide  40 mg Oral Daily  . levETIRAcetam  1,500 mg Oral Q12H  . Living Better with Heart Failure Book   Does not apply Once  .  morphine injection  2 mg Intravenous Once  . nicotine  14 mg Transdermal Daily  . pantoprazole  40 mg Oral BID  . phenytoin  50 mg Oral QHS  . phenytoin  300 mg Oral QHS  . polyethylene glycol  17 g Oral Daily  . potassium chloride  20 mEq Oral BID      ASSESSMENT AND PLAN: Principal Problem:   Sepsis ([place]) Active Problems:   Essential hypertension   Seizure  disorder (HCC)   Elevated troponin   Normocytic anemia   Tobacco abuse   CKD (chronic kidney disease), stage III   Cavitating mass in right upper lung lobe   Left adrenal mass (HCC)   Coronary artery disease due to lipid rich plaque   Claudication (HCC)   Dehydration   Low back pain   Chronic obstructive pulmonary disease (HCC)   PAD (peripheral artery disease) (HCC)   Back pain   Hypotension   CAD in native artery   Acute respiratory failure with hypoxia (HCC)   Acute diastolic heart failure (HCC)   Postobstructive pneumonia   1. Acute hypoxic respiratory failure post procedure 3/31 due to acute diastolic HF -improved with diuresis. IV lasix over the weekend, switched to PO yesterday, he is in sinus tachycardia and appears euvolemic, I would hold lasix and follow. - Crea normal  -EF 45-50% by echo 2/17  2. PAD: - doppler 05/31/2015 right ABI of 0.72 with high grade disease in his R SFA and L ABI 0.59 with occluded L  SFA - LE angiography 06/20/2015 [facility] 1 directional atherectomy with drug eluding balloon angioplasty R SFA  3. SIRS with leukocytosis:         --improved. IM managing  4. Cavitating mass in RUL, note to have large 8.9x6.8 xm lung mass in posterior R upper lobe: - s/p biopsy 3/31. Back on Plavix  - path pending. 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 CAD s/p DES to RCA: - cath 05/06/2015 with 80% prox RCA treated with DES, 65% 1st Marginal treated medically.  - echo 05/04/2015 EF 45-50%, hypokinesis of apicalanteroseptal myocardium, PA peak pressure 40mHg  6. Worsening anemia:  - suspect possibly  due to his malignancy. Ct negative for bleed.     - started on po iron. Had transfusion yesterday, Hb improved 7--> 8.3  7. L adrenal mass: recent CT of chest 3/15, 7.1x6.1 cm large L adrenal mass increased in size, suspicious for metastasis from lung mass   Desiree Fleming H,MD 10:20 AM

## 2015-06-28 NOTE — Progress Notes (Signed)
Progress Note   Cristian Moss XAJ:[id number] DOB: 06-19-1945 DOA: 06/20/2015 PCP: [name], MD   Brief Narrative:   Cristian Moss is an 70 y.o. male recently admitted with seizures and found to have right upper lobe mass, severe peripheral artery disease admitted for right angiography on 06/20/2015 with atherectomy and drug-eluting balloon angioplasty of right SFA. Postoperatively patient became hypertensive and had leukocytosis with elevated lactate of 2. Placed on empiric antibiotic and given IV hydration. On the evening of 3/28 hemoglobin dropped to 7.3 and given 1 unit PRBC. While being transfused he had low-grade fever and subsequently became persistently hypotensive, was transferred to ICU. Patient was placed on IV hydrocortisone and given IV fluid resuscitation, improved subsequently and transferred to telemetry. On 06/24/15, the patient was transferred back to the SDU secondary to acute hypoxic respiratory failure requiring BiPAP.  Assessment/Plan:   Sepsis with hypotension with hypovolemia/dehydration status post procedure (abdominal aortogram with bifemoral runoff and angioplasty of the right SFA)/probable postobstructive pneumonia Initial concern was for infection (lactic acid and Pro calcitonin both elevated on admission), but cultures have remained negative. The patient has improved on empiric antibiotics (status post 5 days of Zosyn/vancomycin, now on Levaquin) to treat a possible postobstructive pneumonia given right upper lobe mass, stress dose steroids and IV fluids. He remains off blood pressure medications. Cortisol was within normal limits at 12.8. Stress dose steroids have now been weaned and the patient's blood pressure has remained stable. WBC count has steadily risen, possibly from a leukemoid reaction (paraneoplastic from underlying malignancy). Chest x-ray stable. Discontinue empiric antibiotics.  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 History of >40-pack-year smoking. Spoke with Dr. Julien Nordmann who will see the patient in consultation.  Acute on chronic hypoxic respiratory failure/acute diastolic CHF Multifactorial. Initially likely from sepsis, acute diastolic heart failure related to volume overload in the setting for need for fluid volume resuscitation. Developed acute respiratory failure overnight 06/24/15 and was transferred back to the stepdown unit placed on BiPAP. He was diuresed further and was weaned back to nasal cannula oxygen. EF 45-50 percent by echocardiogram done 04/2015. Cardiology following and assisting with management. Repeat chest x-ray done 06/27/15 showed a stable right upper lobe mass and improved surrounding edema.  COPD Continue Brovana and budesonide. Continue albuterol as needed for wheezing.  Severe peripheral artery disease High-grade disease a SFA status post angiography, atherectomy with drug-eluting balloon angioplasty of R SFA.  CAD with recent cardiac cath with 80% proximal RCA stenosis treated with DES/elevated troponin Continue aspirin/Plavix and statin. Had mild elevation of troponin 1. Cardiology following.  Iron deficiency anemia Status post 3 units of PRBCs. Continue iron supplement. Fecal occult blood testing pending.  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 Follows with outpatient neurology.  Tobacco abuse Counseled strongly on cessation. Nicotine patch.  Chronic kidney disease stage III Stable.  Low back pain Patient be due to L1 compression fracture seen on imaging. Pain control and PT ordered. Bowel regimen started.  Non anion gap Metabolic acidosis Resolved. Discontinue bicarbonate supplementation.  Fatigue and protein calorie malnutrition, likely severe / Failure to thrive / Cachexia Patient is very  weak and has had a poor appetite for a long time, likely from metastatic lung cancer. He is losing a significant amount of weight and has muscle wasting. Tolerating Marinol for appetite stimulation. BMI  25.37. Dietitian evaluated 06/27/15. Nutritional supplements ordered.    Family Communication/Anticipated D/C date and plan/Code Status   Family Communication: Wife, daughter and son-in-law at the bedside. Disposition Plan/date: From home, and with newly diagnosed cancer, family wishes to take him home at D/C.  Awaiting oncology input. Code Status: Full code.   Procedures and diagnostic studies:   Ct Abdomen Pelvis Wo Contrast  06/21/2015  CLINICAL DATA:  Back pain. Catheterization yesterday. Evaluate for retroperitoneal hematoma. EXAM: CT ABDOMEN AND PELVIS WITHOUT CONTRAST TECHNIQUE: Multidetector CT imaging of the abdomen and pelvis was performed following the standard protocol without IV contrast. COMPARISON:  05/05/2015 FINDINGS: Lower chest and abdominal wall: Fatty enlargement of the bilateral inguinal canal. There is a small right pleural effusion which is newly seen recent chest CT, with atelectatic type opacity in the right lower lobe. Hepatobiliary: Low-density foci best in the liver seen on prior enhanced study are not visible by this technique.Cholelithiasis. No signs of obstruction or inflammation. Pancreas: There is a 6 mm low-density mass in the uncinate process, doubtful clinical significance given the lung and left adrenal findings. This will be followed with the adrenal mass. Spleen: Unremarkable. Adrenals/Urinary Tract: Continued enlargement of a left adrenal mass which has cystic density but mural complexity on prior enhanced CT. Mass measures up to 85 mm today. Cortical retention and continued excretion of IV contrast from catheterization yesterday. Although initially concerning for ATN there is stable creatinine clearance. No hydronephrosis. Negative bladder. Reproductive:No  pathologic findings. Stomach/Bowel: Suspect proximal gastric diverticulum containing high-density material. Prominent volume of formed stool. No obstruction. No appendicitis. Vascular/Lymphatic: No retroperitoneal hematoma. Diffuse atherosclerosis No mass or adenopathy. Peritoneal: No ascites or pneumoperitoneum. Musculoskeletal: L1 superior endplate fracture with mild depression, stable from chest CT 06/08/2015. No visualized osseous metastasis. Chronic L5 right pars defect. IMPRESSION: 1. Negative for retroperitoneal hematoma. 2. Trace right pleural effusion with lower lobe atelectasis. 3. Continued growth of the cystic left adrenal mass, now ~8 cm, metastasis favored. 4. L1 superior endplate fracture with stable depression since 06/08/2015. 5. Probable developing constipation. Electronically Signed   By: Monte Fantasia M.D.   On: 06/21/2015 12:54   Dg Lumbar Spine Complete  06/08/2015  CLINICAL DATA:  Low back pain for several days.  No injury. EXAM: LUMBAR SPINE - COMPLETE 4+ VIEW COMPARISON:  CT abdomen and pelvis 05/05/2015 FINDINGS: There are 5 non rib-bearing lumbar type vertebral bodies. Vertebral alignment is normal. Vertebral body heights are preserved. There is minimal disc space narrowing at L5-S1, with vacuum disc phenomenon noted on the prior CT. Disc space heights are preserved elsewhere. A right L5 pars defect is noted. Atherosclerotic aortic calcification is noted. IMPRESSION: 1. No acute osseous abnormality identified. 2. 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 Minimal L5-S1 disc narrowing. Electronically Signed   By: Logan Bores M.D.   On: 06/08/2015 13:07   Ct Chest Wo Contrast  06/08/2015  CLINICAL DATA:  70 year old male with cavitary right upper lobe lung mass presenting for follow-up status post antibiotic therapy. EXAM: CT CHEST WITHOUT CONTRAST TECHNIQUE: Multidetector CT imaging of the chest was performed following the standard protocol without IV contrast. COMPARISON:  05/04/2015  chest CT . FINDINGS: Mediastinum/Nodes: Normal heart size. No pericardial fluid/thickening. Extensive coronary atherosclerosis. Atherosclerotic nonaneurysmal thoracic aorta. Stable dilated main pulmonary artery (3.1 cm diameter). Normal visualized thyroid. Normal esophagus. No axillary adenopathy. No pathologically enlarged mediastinal lymph nodes. Stable mildly enlarged 1.4 cm right hilar node (series 2/ image 29). No gross left hilar adenopathy on this  noncontrast study. Lungs/Pleura: No pneumothorax. No pleural effusion. There are layering inspissated secretions throughout the lower trachea and bilateral mainstem bronchi. There is mild centrilobular emphysema and diffuse bronchial wall thickening. There is a large 8.9 x 6.8 cm lung mass in the posterior right upper lobe (series 3/ image 25), which abuts the visceral pleural and right major fissure, previously measuring 7.6 x 5.8 cm on 05/04/2015, significantly increased in size. Please note that the mass significantly distorts the superior aspect of the right major fissure in the mass may traverse the fissure into the superior segment of the right lower lobe. There is patchy consolidation, ground-glass opacity and reticulation throughout the dependent and basilar right upper lobe surrounding the right upper lobe mass, not definitely changed. Solid 1.2 cm lingular pulmonary nodule (series 3/image 35), previously 1.1 cm on 05/04/2015 and 0.8 cm on 01/17/2009, not definitely changed in the interval. New atelectasis in the posterior lingula. No new significant pulmonary nodules. Upper abdomen: Partial visualization of large left adrenal mass measuring at least 7.1 x 6.1 cm, previously 6.3 x 5.8 cm, increased in size. Musculoskeletal: There is new lucency and irregularity of the visualize superior endplate of the L1 vertebral body, cannot exclude L1 fracture. IMPRESSION: 1. Significant interval growth of large 8.9 cm posterior right upper lobe lung mass abutting the  visceral pleural and abutting and distorting the right major fissure, possibly traversing into the superior segment right lower lobe, most consistent with a primary bronchogenic carcinoma. 2. No appreciable change in nonspecific patchy consolidation, ground-glass opacity and reticulation throughout the posterior/basilar right upper lobe surrounding the lung mass, which could represent alveolar hemorrhage and/or lymphangitic tumor spread. 3. Solid 1.2 cm lingular pulmonary nodule, slowly growing since 2010, indeterminate. 4. Stable mild right hilar lymphadenopathy, suspicious for nodal metastasis. No mediastinal lymphadenopathy. 5. 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 6. Recommend further evaluation of these findings with PET-CT. 7. New lucency and irregularity of the visualized superior endplate of the L1 vertebral body, cannot exclude L1 vertebral fracture. Consider correlation with lumbar spine MRI as clinically warranted. 8. Additional findings include coronary atherosclerosis, chronic main pulmonary artery dilation suggesting pulmonary arterial hypertension and mild emphysema. Electronically Signed   By: Ilona Sorrel M.D.   On: 06/08/2015 13:03   Ct Biopsy  06/24/2015  INDICATION: Enlarging posterior right upper lobe mass EXAM: CT-GUIDED BIOPSY LARGE RIGHT UPPER LOBE CENTRALLY NECROTIC MASS MEDICATIONS: 1% LIDOCAINE LOCALLY ANESTHESIA/SEDATION: 1.0 mg IV Versed; 25 mcg IV Fentanyl Moderate Sedation Time:  20 MINUTES The patient was continuously monitored during the procedure by the interventional radiology nurse under my direct supervision. PROCEDURE: The procedure, risks, benefits, and alternatives were explained to the patient. Questions regarding the procedure were encouraged and answered. The patient understands and consents to the procedure. The right posterior back was prepped with ChloraPrep in a sterile fashion, and a  sterile drape was applied covering the operative field. A sterile gown and sterile gloves were used for the procedure. Previous imaging reviewed. Patient positioned right side down decubitus. Noncontrast localization CT performed. The large posterior right upper lobe mass was localized. Under sterile conditions and local anesthesia, a 17 gauge 6.8 cm access needle was advanced from a posterior intercostal approach to the periphery of the right upper lobe mass. Needle position confirmed with CT. 18 gauge core biopsies obtained and placed in formalin. Needle tract embolized with the biosentry device to aide in pneumothorax prevention. Postprocedure imaging demonstrates no  complication. Patient tolerated the procedure well without complication. Vital sign monitoring by nursing staff during the procedure will continue as patient is in the special procedures unit for post procedure observation. FINDINGS: The images document guide needle placement within the large posterior right upper lobe mass. Post biopsy images demonstrate no effusion or pneumothorax. COMPLICATIONS: None immediate. IMPRESSION: Successful CT-guided core biopsy of the right upper lobe mass. Electronically Signed   By: Jerilynn Mages.  Shick M.D.   On: 06/24/2015 11:05   Dg Chest Port 1 View  06/27/2015  CLINICAL DATA:  Shortness of breath. EXAM: PORTABLE CHEST 1 VIEW COMPARISON:  06/24/2015. FINDINGS: Stable cardiomediastinal silhouette. Large RIGHT upper lobe mass is redemonstrated with surrounding interstitial edema, improved after Lasix. No pneumothorax. IMPRESSION: Stable RIGHT upper lobe mass.  Improved surrounding edema. Electronically Signed   By: Staci Righter M.D.   On: 06/27/2015 10:20   Dg Chest Port 1 View  06/24/2015  CLINICAL DATA:  Cavitating right upper lobe lung mass. EXAM: PORTABLE CHEST 1 VIEW COMPARISON:  06/22/2015 and CT 06/08/2015 FINDINGS: Examination demonstrates adequate lung volumes with no significant change in patient's known large  right lung mass which has undergone recent percutaneous biopsy. There is less interstitial density adjacent to the mass. No significant effusion. No evidence of pneumothorax. Cardiomediastinal silhouette and remainder of the exam is unchanged. IMPRESSION: Stable known large right upper lobe lung mass post recent percutaneous biopsy. Near resolution of previously seen heterogeneous density adjacent the mass. Electronically Signed   By: Marin Olp M.D.   On: 06/24/2015 14:03   Dg Chest Port 1 View  06/22/2015  CLINICAL DATA:  Respiratory failure . EXAM: PORTABLE CHEST 1 VIEW COMPARISON:  06/20/2015.  CT 06/08/2015. FINDINGS: Mediastinum is stable. Heart size normal. Right upper lobe infiltrate/mass again noted. Again this could represent a pulmonary abscess or neoplasm. Improvement of right upper lobe atelectasis prior exam. Mild bibasilar atelectasis. No pleural effusion or pneumothorax . IMPRESSION: 1. Persistent rounded infiltrate/mass right upper lobe. Again this could represent a pulmonary abscess or neoplasm. Improvement of right upper lobe atelectasis noted on today's exam. 2. Mild bibasilar atelectasis. Electronically Signed   By: Marcello Moores  Register   On: 06/22/2015 07:14   Dg Chest Port 1 View  06/20/2015  CLINICAL DATA:  Tachycardia, chills, elevated white blood cells EXAM: PORTABLE CHEST 1 VIEW COMPARISON:  06/08/2015 FINDINGS: There is right upper lobe consolidation. There is no pleural effusion or pneumothorax. The heart and mediastinal contours are unremarkable. The osseous structures are unremarkable. IMPRESSION: Right upper lobe consolidation most concerning for pneumonia. Given the masslike area in the right upper lobe on previous CT of the chest dated 06/09/2015 this is concerning for possible intrapulmonary abscess. Electronically Signed   By: Kathreen Devoid   On: 06/20/2015 16:31   Dg Chest Port 1v Same Day  06/24/2015  CLINICAL DATA:  Congestive heart failure. Increase shortness of breath.  Right lung mass. Biopsy earlier today. EXAM: PORTABLE CHEST 1 VIEW COMPARISON:  Plain films, including earlier today at 1349 hours. FINDINGS: Patient rotated right. Normal heart size with a tortuous thoracic aorta. No pleural effusion or pneumothorax. Right upper lobe lung mass again identified. There is patchy interstitial and airspace disease within the right upper, right lower, and left lower lobes. New. IMPRESSION: Multifocal interstitial and airspace disease, worse on the right. This is superimposed upon a known right upper lobe lung mass. Given time course of development, favored to represent pulmonary edema. Aspiration pneumonitis could have a similar appearance. No pneumothorax  or other acute complication of biopsy. The right upper lobe opacity is felt unlikely to represent biopsy induced hemorrhage, given development of multi focal airspace disease. Electronically Signed   By: Abigail Miyamoto M.D.   On: 06/24/2015 19:12    Medical Consultants:    Pulmonology  Interventional Radiology  Anti-Infectives:   Levaquin 06/24/15--->06/28/15 Vancomycin/Zosyn 06/20/15---> 06/24/15   Subjective:   Cristian Moss continues to be weak but reports that his appetite has improved slightly. Remains tachypneic. Has occasional pain. Mildly short of breath at times.  Objective:    Filed Vitals:   06/28/15 0026 06/28/15 0400 06/28/15 0500 06/28/15 0726  BP: 118/71 138/80  129/77  Pulse: 97 102  100  Temp: 98.1 F (36.7 C) 98.2 F (36.8 C)  98 F (36.7 C)  TempSrc: Oral Oral  Oral  Resp: '24 28  23  '$ Height:      Weight:   77.293 kg (170 lb 6.4 oz)   SpO2: 96% 94%  98%    Intake/Output Summary (Last 24 hours) at 06/28/15 0735 Last data filed at 06/28/15 5956  Gross per 24 hour  Intake    300 ml  Output    925 ml  Net   -625 ml   Filed Weights   06/26/15 0400 06/27/15 0500 06/28/15 0500  Weight: 80 kg (176 lb 5.9 oz) 80.2 kg (176 lb 12.9 oz) 77.293 kg (170 lb 6.4 oz)    Exam: Gen:  Frail,  Remains weak Cardiovascular:  RRR, No M/R/G Respiratory:  Lungs with faint rhonchi bilaterally Gastrointestinal:  Abdomen soft, NT/ND, + BS Extremities:  Clubbing present, no edema   Data Reviewed:    Labs: Basic Metabolic Panel:  Recent Labs Lab 06/22/15 0342 06/23/15 0420 06/24/15 0345 06/25/15 0251 06/26/15 0226 06/27/15 0246  NA 141  139 139 140 140 139 138  K 4.0  3.9 3.8 3.8 3.8 4.2 4.5  CL 117*  115* 116* 115* 114* 109 106  CO2 17*  17* 15* 16* 18* 22 25  GLUCOSE 116*  114* 175* 102* 102* 114* 109*  BUN '17  18 17 12 10 8 8  '$ CREATININE 1.11  1.07 1.03 0.98 0.73 0.93 1.02  CALCIUM 7.4*  7.4* 7.4* 7.6* 7.5* 7.5* 7.8*  MG 1.5*  --   --   --   --   --   PHOS 2.9  --   --   --   --   --    GFR Estimated Creatinine Clearance: 68.6 mL/min (by C-G formula based on Cr of 1.02). Liver Function Tests: No results for input(s): AST, ALT, ALKPHOS, BILITOT, PROT, ALBUMIN in the last 168 hours. Coagulation profile  Recent Labs Lab 06/24/15 0345  INR 1.44    CBC:  Recent Labs Lab 06/23/15 0420 06/24/15 0345 06/25/15 0251 06/26/15 0226 06/27/15 0246  WBC 33.6* 29.3* 20.6* 22.2* 25.6*  NEUTROABS  --   --   --   --  20.3*  HGB 7.1* 7.5* 7.1* 7.0* 8.7*  HCT 22.0* 24.8* 22.5* 22.6* 26.7*  MCV 89.8 90.5 89.3 89.3 89.6  PLT 263 330 324 295 274   Cardiac Enzymes:  Recent Labs Lab 06/22/15 0342  TROPONINI 0.06*   CBG:  Recent Labs Lab 06/22/15 0222  GLUCAP 112*   Sepsis Labs:  Recent Labs Lab 06/22/15 0342 06/23/15 0420 06/24/15 0345 06/25/15 0251 06/26/15 0226 06/27/15 0246  PROCALCITON 0.75 0.59 0.46  --   --   --   WBC 19.8* 33.6* 29.3* 20.6*  22.2* 25.6*  LATICACIDVEN 0.8  --   --   --   --   --    Microbiology Recent Results (from the past 240 hour(s))  Culture, blood (x 2)     Status: None   Collection Time: 06/20/15  7:35 PM  Result Value Ref Range Status   Specimen Description BLOOD BLOOD LEFT FOREARM  Final   Special Requests  BOTTLES DRAWN AEROBIC AND ANAEROBIC 5CC  Final   Culture NO GROWTH 5 DAYS  Final   Report Status 06/25/2015 FINAL  Final  Culture, blood (x 2)     Status: None   Collection Time: 06/20/15  7:50 PM  Result Value Ref Range Status   Specimen Description BLOOD RIGHT ARM  Final   Special Requests BOTTLES DRAWN AEROBIC AND ANAEROBIC 5CC  Final   Culture NO GROWTH 5 DAYS  Final   Report Status 06/25/2015 FINAL  Final  Urine culture     Status: None   Collection Time: 06/21/15 12:22 AM  Result Value Ref Range Status   Specimen Description URINE, CLEAN CATCH  Final   Special Requests Normal  Final   Culture 4,000 COLONIES/mL INSIGNIFICANT GROWTH  Final   Report Status 06/22/2015 FINAL  Final  MRSA PCR Screening     Status: None   Collection Time: 06/22/15  2:13 AM  Result Value Ref Range Status   MRSA by PCR NEGATIVE NEGATIVE Final    Comment:        The GeneXpert MRSA Assay (FDA approved for NASAL specimens only), is one component of a comprehensive MRSA colonization surveillance program. It is not intended to diagnose MRSA infection nor to guide or monitor treatment for MRSA infections.   MRSA PCR Screening     Status: None   Collection Time: 06/24/15  7:22 PM  Result Value Ref Range Status   MRSA by PCR NEGATIVE NEGATIVE Final    Comment:        The GeneXpert MRSA Assay (FDA approved for NASAL specimens only), is one component of a comprehensive MRSA colonization surveillance program. It is not intended to diagnose MRSA infection nor to guide or monitor treatment for MRSA infections.      Medications:   . antiseptic oral rinse  7 mL Mouth Rinse BID  . arformoterol  15 mcg Nebulization BID  . aspirin EC  81 mg Oral Daily  . atorvastatin  80 mg Oral q1800  . bromocriptine  2.5 mg Oral BID  . budesonide (PULMICORT) nebulizer solution  0.5 mg Nebulization BID  . clopidogrel  75 mg Oral Daily  . dronabinol  2.5 mg Oral BID AC  . feeding supplement (ENSURE ENLIVE)  237  mL Oral TID BM  . ferrous sulfate  325 mg Oral BID WC  . folic acid  1 mg Oral Daily  . furosemide  40 mg Oral Daily  . levETIRAcetam  1,500 mg Oral Q12H  . levofloxacin  750 mg Oral Daily  . Living Better with Heart Failure Book   Does not apply Once  .  morphine injection  2 mg Intravenous Once  . nicotine  14 mg Transdermal Daily  . pantoprazole  40 mg Oral BID  . phenytoin  50 mg Oral QHS  . phenytoin  300 mg Oral QHS  . polyethylene glycol  17 g Oral Daily  . potassium chloride  20 mEq Oral BID   Continuous Infusions:   Time spent: 1 hour with > 50% of time discussing current  diagnostic test results, clinical impression and plan of care.    LOS: 6 days   [facility] Pager [phone]. If unable to reach me by pager, please call my cell phone at [phone].  *Please refer to [email or web address], password [email or web address] to get updated schedule on who will round on this patient, as hospitalists switch teams weekly. If 7PM-7AM, please contact night-coverage at [email or web address], password [email or web address] for any overnight needs.  06/28/2015, 7:35 AM

## 2015-06-28 NOTE — Progress Notes (Signed)
[place] [phone]   [phone]  CONSULT NOTE  REFERRING PHYSICIAN: Dr. Gerald Stabs Rama  REASON FOR CONSULTATION:  70 years old African-American male recently diagnosed with lung cancer.  HPI Cristian Moss is a 70 y.o. male with past medical history significant for multiple medical problems including history of COPD, chronic kidney disease, benign prostatic hypertrophy, history of lead exposure with subsequent seizure, hypertension as well as long history of smoking. In February 2017 the patient was admitted to [facility] with seizure activity with associated chest pain and shortness of breath. During his evaluation chest x-ray followed by CT scan of the chest showed right lung mass suspicious for malignancy. CT scan of the chest, abdomen and pelvis on 05/04/2015 showed an oval mass within the inferior segment of the right upper lobe, posteriorly, with thick enhancing wall circumferentially, measuring 7 x 5.4 x 8.4 cm (AP by transverse by craniocaudal dimensions), predominantly fluid density centrally, containing small foci of air at its upper aspect. There is extensive interstitial thickening throughout the inferior aspects of the right upper lobe, centered at and just below the level of this mass. There was also 7.5 cm left adrenal mass, suspicious for a left adrenal metastasis. There was also a subcentimeter hypodense liver lesions too small to characterize. He was followed by pulmonary medicine but was unable to undergo biopsy during that time secondary to anticoagulation. Repeat CT scan of the chest without contrast on 06/08/2015 showed significant interval growth of a large 8.9 cm posterior right upper lobe lung mass abutting the visceral pleura and abutting and distorting the right major fissure and possibly transversing down to the superior segment of the right lower lobe most consistent with primary bronchogenic carcinoma. There was also a solid  1.2 cm lingular pulmonary nodule slowly growing. On 06/20/2015 the patient was admitted again to [facility] with significant shortness breath and generalized weakness. During this admission the patient underwent CT-guided core biopsy of the right upper lobe lung mass by interventional radiology. The final pathology (Accession: [id number]) showed poorly differentiated carcinoma. Immunohistochemistry is performed which shows positivity with cytokeratin AE1/AE3, and strong nuclear positivity with thyroid transcription factor-1. The tumor is negative with cytokeratin 5/6, Napsin A, cytokeratin 7, cytokeratin 20, MOC-31, CDX2, S-100, Melan-A and CD68. The cytokeratin AE1/AE3 positivity is consistent with poorly differentiated carcinoma, and the strong nuclear positivity with thyroid transcription factor-1 is consistent with primary lung adenocarcinoma. Dr.Rama kindly ask me to see the patient today for evaluation and discussion of his treatment options. When seen today the patient was on face mask and was unable to give any history and poorly communicating with me. History was given by his family as well as the medical records. He has no current significant fever or chills, nausea or vomiting. He continues to have significant shortness of breath. HPI  Past Medical History  Diagnosis Date  . Essential hypertension   . Seizures ([place])   . COPD (chronic obstructive pulmonary disease) ([place])   . Thrombocytopenia ([place])   . 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 BPH (benign prostatic hypertrophy)   . Erectile dysfunction   . Empty sella syndrome ([place])     History of  . CKD (chronic kidney disease), stage III   . Hyponatremia     history of  . Tobacco abuse   . Cachexia ([place]) 06/28/2015  . Metastatic lung cancer (metastasis from lung to other site) ([place]) 06/28/2015  . 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 Finger amputation Left     5th finger  .  Cardiac catheterization N/A 05/06/2015    Procedure: Left Heart Cath and Coronary Angiography;  Surgeon: Leonie Man, MD;  Location: [place] [facility];  Service: Cardiovascular;  Laterality: N/A;  . Cardiac catheterization N/A 05/06/2015    Procedure: Coronary Stent Intervention;  Surgeon: Leonie Man, MD;  Location: [place] [facility];  Service: Cardiovascular;  Laterality: N/A;  . Peripheral vascular catheterization N/A 06/20/2015    Procedure: Abdominal Aortogram w/Lower Extremity;  Surgeon: Lorretta Harp, MD;  Location: [place] [facility];  Service: Cardiovascular;  Laterality: N/A;    Family History  Problem Relation Age of Onset  . Asthma Daughter   . Heart disease Sister     ? developed in her 43's  . Other Mother     died in her 61's of unknown causes  . Other Father     pt unaware of father's pmh.    Social History Social History  Substance Use Topics  . Smoking status: Former Smoker -- 0.50 packs/day for 47 years    Types: Cigarettes    Quit date: 05/03/2015  . Smokeless tobacco: Never Used  . Alcohol Use: No    No Known Allergies  Current Facility-Administered Medications  Medication Dose Route Frequency Provider Last Rate Last Dose  . acetaminophen (TYLENOL) tablet 975 mg  975 mg Oral Q6H PRN Minus Breeding, MD   975 mg at 06/22/15 1942  . albuterol (PROVENTIL) (2.5 MG/3ML) 0.083% nebulizer solution 2.5 mg  2.5 mg Inhalation Q3H PRN Rahul P Desai, PA-C   2.5 mg at 06/28/15 1505  . antiseptic oral rinse (CPC / CETYLPYRIDINIUM CHLORIDE 0.05%) solution 7 mL  7 mL Mouth Rinse BID Almyra Deforest, PA   7 mL at 06/28/15 1000  . arformoterol (BROVANA) nebulizer solution 15 mcg  15 mcg Nebulization BID Rahul P Desai, PA-C   15 mcg at 06/28/15 0757  . aspirin EC tablet 81 mg  81 mg Oral Daily Troy Sine, MD   81 mg at 06/28/15 0846  . atorvastatin (LIPITOR) tablet 80 mg  80 mg Oral q1800 Lorretta Harp, MD   80 mg at 06/27/15 1745  . bisacodyl (DULCOLAX) EC tablet 5  mg  5 mg Oral Daily PRN Lorretta Harp, MD   5 mg at 06/24/15 1545  . bromocriptine (PARLODEL) tablet 2.5 mg  2.5 mg Oral BID Lorretta Harp, MD   2.5 mg at 06/28/15 1049  . budesonide (PULMICORT) nebulizer solution 0.5 mg  0.5 mg Nebulization BID Rahul P Desai, PA-C   0.5 mg at 06/28/15 0757  . clopidogrel (PLAVIX) tablet 75 mg  75 mg Oral Daily Arbutus Leas, NP   75 mg at 06/28/15 0844  . diazepam (VALIUM) tablet 5 mg  5 mg Oral Q8H PRN Venetia Maxon Rama, MD   5 mg at 06/28/15 1448  . dronabinol (MARINOL) capsule 5 mg  5 mg Oral BID AC Venetia Maxon Rama, MD   5 mg at 06/28/15 1646  . feeding supplement (ENSURE ENLIVE) (ENSURE ENLIVE) liquid 237 mL  237 mL Oral TID BM Christina P Rama, MD   237 mL at 06/28/15 1400  . fentaNYL (SUBLIMAZE) injection 25-50 mcg  25-50 mcg Intravenous Q4H PRN Nishant Dhungel, MD   50 mcg at 06/28/15 1223  . ferrous sulfate tablet 325 mg  325 mg Oral BID WC Pearletha Forge  Gwenlyn Found, MD   325 mg at 06/28/15 1646  . folic acid (FOLVITE) tablet 1 mg  1 mg Oral Daily Troy Sine, MD   1 mg at 06/28/15 0846  . levETIRAcetam (KEPPRA) tablet 1,500 mg  1,500 mg Oral Q12H Lorretta Harp, MD   1,500 mg at 06/28/15 0845  . Living Better with Heart Failure Book   Does not apply Once Lorretta Harp, MD      . methylPREDNISolone sodium succinate (SOLU-MEDROL) 40 mg/mL injection 40 mg  40 mg Intravenous BID Venetia Maxon Rama, MD   40 mg at 06/28/15 1640  . morphine 2 MG/ML injection 2 mg  2 mg Intravenous Once Louellen Molder, MD   Stopped at 06/24/15 1530  . nicotine (NICODERM CQ - dosed in mg/24 hours) patch 14 mg  14 mg Transdermal Daily Lorretta Harp, MD   14 mg at 06/28/15 0848  . nitroGLYCERIN (NITROSTAT) SL tablet 0.4 mg  0.4 mg Sublingual Q5 min PRN Lorretta Harp, MD      . ondansetron [facility]) injection 4 mg  4 mg Intravenous Q6H PRN Lorretta Harp, MD   4 mg at 06/20/15 1445  . oxyCODONE (Oxy IR/ROXICODONE) immediate release tablet 10 mg  10 mg Oral Q4H PRN Venetia Maxon Rama, MD   10 mg at 06/28/15 1448  . pantoprazole (PROTONIX) EC tablet 40 mg  40 mg Oral BID Lorretta Harp, MD   40 mg at 06/28/15 0845  . phenytoin (DILANTIN) chewable tablet 50 mg  50 mg Oral QHS Lorretta Harp, MD   50 mg at 06/27/15 2224  . phenytoin (DILANTIN) ER capsule 300 mg  300 mg Oral QHS Lorretta Harp, MD   300 mg at 06/27/15 2224  . polyethylene glycol (MIRALAX / GLYCOLAX) packet 17 g  17 g Oral Daily Nishant Dhungel, MD   17 g at 06/28/15 0846  . potassium chloride (K-DUR) CR tablet 20 mEq  20 mEq Oral BID Jolaine Artist, MD   20 mEq at 06/28/15 0845    Review of Systems  Constitutional: positive for anorexia, fatigue and weight loss Eyes: negative Ears, nose, mouth, throat, and face: negative Respiratory: positive for cough, dyspnea on exertion, sputum and wheezing Cardiovascular: negative Gastrointestinal: negative Genitourinary:negative Integument/breast: negative Hematologic/lymphatic: negative Musculoskeletal:positive for muscle weakness Neurological: positive for seizures Behavioral/Psych: negative Endocrine: negative Allergic/Immunologic: negative  Physical Exam  CVE:LFYBOFBP, ill looking, malnourished and uncooperative SKIN: skin color, texture, turgor are normal, no rashes or significant lesions HEAD: Normocephalic, No masses, lesions, tenderness or abnormalities EYES: normal, PERRLA NECK: supple, no adenopathy LYMPH:  no palpable lymphadenopathy, no hepatosplenomegaly LUNGS: expiratory wheezes bilaterally, scattered rhonchi bilaterally, scattered rales bilaterally HEART: regular rate & rhythm and no murmurs ABDOMEN:abdomen soft, non-tender, normal bowel sounds and no masses or organomegaly BACK: Back symmetric, no curvature., No CVA tenderness EXTREMITIES:no joint deformities, effusion, or inflammation, no edema   PERFORMANCE STATUS: ECOG 3-4  LABORATORY DATA: Lab Results  Component Value Date   WBC 25.6* 06/27/2015   HGB 8.7*  06/27/2015   HCT 26.7* 06/27/2015   MCV 89.6 06/27/2015   PLT 274 06/27/2015    '@LASTCHEM'$ @  RADIOGRAPHIC STUDIES: Ct Abdomen Pelvis Wo Contrast  06/21/2015  CLINICAL DATA:  Back pain. Catheterization yesterday. Evaluate for retroperitoneal hematoma. EXAM: CT ABDOMEN AND PELVIS WITHOUT CONTRAST TECHNIQUE: Multidetector CT imaging of the abdomen and pelvis was performed following the standard protocol without IV contrast. COMPARISON:  05/05/2015 FINDINGS: Lower chest and abdominal wall:  Fatty enlargement of the bilateral inguinal canal. There is a small right pleural effusion which is newly seen recent chest CT, with atelectatic type opacity in the right lower lobe. Hepatobiliary: Low-density foci best in the liver seen on prior enhanced study are not visible by this technique.Cholelithiasis. No signs of obstruction or inflammation. Pancreas: There is a 6 mm low-density mass in the uncinate process, doubtful clinical significance given the lung and left adrenal findings. This will be followed with the adrenal mass. Spleen: Unremarkable. Adrenals/Urinary Tract: Continued enlargement of a left adrenal mass which has cystic density but mural complexity on prior enhanced CT. Mass measures up to 85 mm today. Cortical retention and continued excretion of IV contrast from catheterization yesterday. Although initially concerning for ATN there is stable creatinine clearance. No hydronephrosis. Negative bladder. Reproductive:No pathologic findings. Stomach/Bowel: Suspect proximal gastric diverticulum containing high-density material. Prominent volume of formed stool. No obstruction. No appendicitis. Vascular/Lymphatic: No retroperitoneal hematoma. Diffuse atherosclerosis No mass or adenopathy. Peritoneal: No ascites or pneumoperitoneum. Musculoskeletal: L1 superior endplate fracture with mild depression, stable from chest CT 06/08/2015. No visualized osseous metastasis. Chronic L5 right pars defect. IMPRESSION: 1.  Negative for retroperitoneal hematoma. 2. Trace right pleural effusion with lower lobe atelectasis. 3. Continued growth of the cystic left adrenal mass, now ~8 cm, metastasis favored. 4. L1 superior endplate fracture with stable depression since 06/08/2015. 5. Probable developing constipation. Electronically Signed   By: Monte Fantasia M.D.   On: 06/21/2015 12:54   Dg Lumbar Spine Complete  06/08/2015  CLINICAL DATA:  Low back pain for several days.  No injury. EXAM: LUMBAR SPINE - COMPLETE 4+ VIEW COMPARISON:  CT abdomen and pelvis 05/05/2015 FINDINGS: There are 5 non rib-bearing lumbar type vertebral bodies. Vertebral alignment is normal. Vertebral body heights are preserved. There is minimal disc space narrowing at L5-S1, with vacuum disc phenomenon noted on the prior CT. Disc space heights are preserved elsewhere. A right L5 pars defect is noted. Atherosclerotic aortic calcification is noted. IMPRESSION: 1. No acute osseous abnormality identified. 2. 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 Minimal L5-S1 disc narrowing. Electronically Signed   By: Logan Bores M.D.   On: 06/08/2015 13:07   Ct Chest Wo Contrast  06/08/2015  CLINICAL DATA:  70 year old male with cavitary right upper lobe lung mass presenting for follow-up status post antibiotic therapy. EXAM: CT CHEST WITHOUT CONTRAST TECHNIQUE: Multidetector CT imaging of the chest was performed following the standard protocol without IV contrast. COMPARISON:  05/04/2015 chest CT . FINDINGS: Mediastinum/Nodes: Normal heart size. No pericardial fluid/thickening. Extensive coronary atherosclerosis. Atherosclerotic nonaneurysmal thoracic aorta. Stable dilated main pulmonary artery (3.1 cm diameter). Normal visualized thyroid. Normal esophagus. No axillary adenopathy. No pathologically enlarged mediastinal lymph nodes. Stable mildly enlarged 1.4 cm right hilar node (series 2/ image 29). No gross left hilar adenopathy on this noncontrast study. Lungs/Pleura: No  pneumothorax. No pleural effusion. There are layering inspissated secretions throughout the lower trachea and bilateral mainstem bronchi. There is mild centrilobular emphysema and diffuse bronchial wall thickening. There is a large 8.9 x 6.8 cm lung mass in the posterior right upper lobe (series 3/ image 25), which abuts the visceral pleural and right major fissure, previously measuring 7.6 x 5.8 cm on 05/04/2015, significantly increased in size. Please note that the mass significantly distorts the superior aspect of the right major fissure in the mass may traverse the fissure into the superior segment of the right lower lobe. There is patchy consolidation, ground-glass opacity and reticulation throughout the dependent  and basilar right upper lobe surrounding the right upper lobe mass, not definitely changed. Solid 1.2 cm lingular pulmonary nodule (series 3/image 35), previously 1.1 cm on 05/04/2015 and 0.8 cm on 01/17/2009, not definitely changed in the interval. New atelectasis in the posterior lingula. No new significant pulmonary nodules. Upper abdomen: Partial visualization of large left adrenal mass measuring at least 7.1 x 6.1 cm, previously 6.3 x 5.8 cm, increased in size. Musculoskeletal: There is new lucency and irregularity of the visualize superior endplate of the L1 vertebral body, cannot exclude L1 fracture. IMPRESSION: 1. 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 2. No appreciable change in nonspecific patchy consolidation, ground-glass opacity and reticulation throughout the posterior/basilar right upper lobe surrounding the lung mass, which could represent alveolar hemorrhage and/or lymphangitic tumor spread. 3. Solid 1.2 cm lingular pulmonary nodule, slowly  growing since 2010, indeterminate. 4. Stable mild right hilar lymphadenopathy, suspicious for nodal metastasis. No mediastinal lymphadenopathy. 5. 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 6. Recommend further evaluation of these findings with PET-CT. 7. New lucency and irregularity of the visualized superior endplate of the L1 vertebral body, cannot exclude L1 vertebral fracture. Consider correlation with lumbar spine MRI as clinically warranted. 8. Additional findings include coronary atherosclerosis, chronic main pulmonary artery dilation suggesting pulmonary arterial hypertension and mild emphysema. Electronically Signed   By: Ilona Sorrel M.D.   On: 06/08/2015 13:03   Ct Biopsy  06/24/2015  INDICATION: Enlarging posterior right upper lobe mass EXAM: CT-GUIDED BIOPSY LARGE RIGHT UPPER LOBE CENTRALLY NECROTIC MASS MEDICATIONS: 1% LIDOCAINE LOCALLY ANESTHESIA/SEDATION: 1.0 mg IV Versed; 25 mcg IV Fentanyl Moderate Sedation Time:  20 MINUTES The patient was continuously monitored during the procedure by the interventional radiology nurse under my direct supervision. PROCEDURE: The procedure, risks, benefits, and alternatives were explained to the patient. Questions regarding the procedure were encouraged and answered. The patient understands and consents to the procedure. The right posterior back was prepped with ChloraPrep in a sterile fashion, and a sterile drape was applied covering the operative field. A sterile gown and sterile gloves were used for the procedure. Previous imaging reviewed. Patient positioned right side down decubitus. Noncontrast localization CT performed. The large posterior right upper lobe mass was localized. Under sterile conditions and local anesthesia, a 17 gauge 6.8 cm access needle was advanced from a posterior intercostal approach to the periphery of the right upper lobe mass. Needle position confirmed with  CT. 18 gauge core biopsies obtained and placed in formalin. Needle tract embolized with the biosentry device to aide in pneumothorax prevention. Postprocedure imaging demonstrates no complication. Patient tolerated the procedure well without complication. Vital sign monitoring by nursing staff during the procedure will continue as patient is in the special procedures unit for post procedure observation. FINDINGS: The images document guide needle placement within the large posterior right upper lobe mass. Post biopsy images demonstrate no effusion or pneumothorax. COMPLICATIONS: None immediate. IMPRESSION: Successful CT-guided core biopsy of the right upper lobe mass. Electronically Signed   By: Jerilynn Mages.  Shick M.D.   On: 06/24/2015 11:05   Dg Chest Port 1 View  06/28/2015  CLINICAL DATA:  Respiratory distress. History of lung cancer. Plain film of the chest 06/27/2015.EXAM: PORTABLE CHEST 1 VIEW COMPARISON:  CT  chest 06/08/2015. FINDINGS: Large mass in the right chest is again seen. Prominence of the surrounding pulmonary interstitium is identified. The lungs are emphysematous. The left lung appears clear. Heart size is normal. 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 Emphysema. Electronically Signed   By: Inge Rise M.D.   On: 06/28/2015 16:03   Dg Chest Port 1 View  06/27/2015  CLINICAL DATA:  Shortness of breath. EXAM: PORTABLE CHEST 1 VIEW COMPARISON:  06/24/2015. FINDINGS: Stable cardiomediastinal silhouette. Large RIGHT upper lobe mass is redemonstrated with surrounding interstitial edema, improved after Lasix. No pneumothorax. IMPRESSION: Stable RIGHT upper lobe mass.  Improved surrounding edema. Electronically Signed   By: Staci Righter M.D.   On: 06/27/2015 10:20   Dg Chest Port 1 View  06/24/2015  CLINICAL DATA:  Cavitating right upper lobe lung mass. EXAM: PORTABLE CHEST 1 VIEW COMPARISON:  06/22/2015 and CT 06/08/2015  FINDINGS: Examination demonstrates adequate lung volumes with no significant change in patient's known large right lung mass which has undergone recent percutaneous biopsy. There is less interstitial density adjacent to the mass. No significant effusion. No evidence of pneumothorax. Cardiomediastinal silhouette and remainder of the exam is unchanged. IMPRESSION: Stable known large right upper lobe lung mass post recent percutaneous biopsy. Near resolution of previously seen heterogeneous density adjacent the mass. Electronically Signed   By: Marin Olp M.D.   On: 06/24/2015 14:03   Dg Chest Port 1 View  06/22/2015  CLINICAL DATA:  Respiratory failure . EXAM: PORTABLE CHEST 1 VIEW COMPARISON:  06/20/2015.  CT 06/08/2015. FINDINGS: Mediastinum is stable. Heart size normal. Right upper lobe infiltrate/mass again noted. Again this could represent a pulmonary abscess or neoplasm. Improvement of right upper lobe atelectasis prior exam. Mild bibasilar atelectasis. No pleural effusion or pneumothorax . IMPRESSION: 1. Persistent rounded infiltrate/mass right upper lobe. Again this could represent a pulmonary abscess or neoplasm. Improvement of right upper lobe atelectasis noted on today's exam. 2. Mild bibasilar atelectasis. Electronically Signed   By: Marcello Moores  Register   On: 06/22/2015 07:14   Dg Chest Port 1 View  06/20/2015  CLINICAL DATA:  Tachycardia, chills, elevated white blood cells EXAM: PORTABLE CHEST 1 VIEW COMPARISON:  06/08/2015 FINDINGS: There is right upper lobe consolidation. There is no pleural effusion or pneumothorax. The heart and mediastinal contours are unremarkable. The osseous structures are unremarkable. IMPRESSION: Right upper lobe consolidation most concerning for pneumonia. Given the masslike area in the right upper lobe on previous CT of the chest dated 06/09/2015 this is concerning for possible intrapulmonary abscess. Electronically Signed   By: Kathreen Devoid   On: 06/20/2015 16:31   Dg  Chest Port 1v Same Day  06/24/2015  CLINICAL DATA:  Congestive heart failure. Increase shortness of breath. Right lung mass. Biopsy earlier today. EXAM: PORTABLE CHEST 1 VIEW COMPARISON:  Plain films, including earlier today at 1349 hours. FINDINGS: Patient rotated right. Normal heart size with a tortuous thoracic aorta. No pleural effusion or pneumothorax. Right upper lobe lung mass again identified. There is patchy interstitial and airspace disease within the right upper, right lower, and left lower lobes. New. IMPRESSION: Multifocal interstitial and airspace disease, worse on the right. This is superimposed upon a known right upper lobe lung mass. Given time course of development, favored to represent pulmonary edema. Aspiration pneumonitis could have a similar appearance. No pneumothorax or other acute complication of biopsy. The right upper lobe opacity is felt unlikely to  represent biopsy induced hemorrhage, given development of multi focal airspace disease. Electronically Signed   By: Abigail Miyamoto M.D.   On: 06/24/2015 19:12    ASSESSMENT: This is a very pleasant 69 years old African-American male recently diagnosed with stage IV (T3, N0, M1b) non-small cell lung cancer, adenocarcinoma presented with large right upper lobe lung mass in addition to satellite nodule in the left lung and large left adrenal metastasis diagnosed in March 2017.   PLAN: I had a lengthy discussion with the patient's family including daughter and wife were answered to bedside about his current disease stage, prognosis and treatment options. Unfortunately the patient was unable to contribute to the discussion because of his general poor condition. Unfortunately the patient has very poor performance status and may not be a candidate for any treatment at this point. I strongly recommended for the family to consider palliative care and hospice at this point, but I would be willing to consider the patient for treatment in case his  condition improves which is unlikely. I would not recommend any further staging workup at this point as the patient may not benefit from any future treatment. If his condition improved, I would consider sending the tissue block for PDL 1 test and consider the patient for treatment with immunotherapy if he has high PDL 1 expression. The family agreed to the current plan. They will monitor his progress over the next few days before making a final decision regarding palliative care. I provided the family with my contact information if they have any questions. The patient voices understanding of current disease status and treatment options and is in agreement with the current care plan.  All questions were answered. The patient knows to call the clinic with any problems, questions or concerns. We can certainly see the patient much sooner if necessary.  Thank you so much for allowing me to participate in the care of Cristian Moss. I will continue to follow up the patient with you and assist in his care.  Disclaimer: This note was dictated with voice recognition software. Similar sounding words can inadvertently be transcribed and may not be corrected upon review.   Cristian Woodford K. June 28, 2015, 5:37 PM

## 2015-06-28 NOTE — Progress Notes (Signed)
[facility] Progress Note Patient Name: Cristian Moss DOB: [birth year] MRN: [id number]   Date of Service  06/28/2015  HPI/Events of Note  resp distress- on NRB  eICU Interventions  Lasix 40 x 1 Await CXR & ABG WOB stable on camera     Intervention Category Intermediate Interventions: Respiratory distress - evaluation and management  Kyrollos Cordell V. 06/28/2015, 3:27 PM

## 2015-06-28 NOTE — Progress Notes (Addendum)
Pt became very SOB stating, " I feel like I am going to die". HR up to 125, SBP 150/80-90. RR in the 30's and somewhat lethargic and very anxious. PRN breathing treatment given, MD paged, Respiratory paged, ELINK assessing from room. Verbal order for 40 mg IV lasix, Chest xray, and ABG. Patient placed on non-re breather. Patient encouraged to cough and take deep breaths. Will continue to monitor acutely.

## 2015-06-28 NOTE — Care Management Note (Signed)
Case Management Note  Patient Details  Name: Cristian Moss MRN: [id number] Date of Birth: March 22, 1946  Subjective/Objective:    Pt lives with spouse, dtr and son-in-law live in [place] and provide support as they are able.  Pt and family do not want SNF, pt is to discharge home with home health RN, PT, OT, and aide, will need a hospital bed and wheelchair, he already has home O2.  Provided list of agencies and referral made to [facility] per choice.               Expected Discharge Plan:  [facility]  Discharge planning Services  CM Consult  Post Acute Care Choice:  Durable Medical Equipment, Home Health Choice offered to:  Adult Children  DME Arranged:  Hospital bed, Wheelchair manual DME Agency:  [facility] Arranged:  RN, PT, OT, Nurse's Aide [facility] Agency:  [facility]  Status of Service:  In process, will continue to follow  Medicare Important Message Given:  Yes  Girard Cooter, RN 06/28/2015, 4:13 PM

## 2015-06-29 ENCOUNTER — Encounter (HOSPITAL_COMMUNITY): Payer: Self-pay | Admitting: Internal Medicine

## 2015-06-29 ENCOUNTER — Other Ambulatory Visit: Payer: Self-pay | Admitting: Physician Assistant

## 2015-06-29 DIAGNOSIS — G893 Neoplasm related pain (acute) (chronic): Secondary | ICD-10-CM

## 2015-06-29 DIAGNOSIS — I739 Peripheral vascular disease, unspecified: Secondary | ICD-10-CM

## 2015-06-29 MED ORDER — FENTANYL 25 MCG/HR TD PT72: 25.0000 ug | MEDICATED_PATCH | TRANSDERMAL | Status: DC

## 2015-06-29 MED ORDER — POLYETHYLENE GLYCOL 3350 17 G PO PACK: 17.0000 g | PACK | Freq: Every day | ORAL | Status: DC | PRN

## 2015-06-29 MED ORDER — FENTANYL 12 MCG/HR TD PT72
12.5000 ug | MEDICATED_PATCH | TRANSDERMAL | Status: DC
  Administered 2015-06-29: 12.5 ug via TRANSDERMAL
  Filled 2015-06-29: qty 1

## 2015-06-29 NOTE — Progress Notes (Signed)
Patient's wife concerned that patient took his NRM mask off and O2 sat decreased to 85%.  RN instructed patient to call RN if having trouble tolerating mask or unable to keep mask on & educated patient's wife that NRB mask was put on patient after he was unable to keepoxygen sats up with 6L [place] and Venturi mask.  Oxygen saturation of 92% acceptable per Dr. Rockne Menghini.Will continue to monitor. [place], Ardeth Sportsman

## 2015-06-29 NOTE — Clinical Social Work Note (Signed)
CSW received referral for SNF.  Case discussed with case manager, and plan is to discharge home.  CSW to sign off please re-consult if social work needs arise.  Enzo Treu R. Niasha Devins, MSW, LCSWA [phone]  

## 2015-06-29 NOTE — Progress Notes (Signed)
Progress Note   [name] AVW:[id number] DOB: [birth year] DOA: 06/20/2015 PCP: [name], MD   Brief Narrative:   Cristian Moss is an 70 y.o. male recently admitted with seizures and found to have right upper lobe mass, severe peripheral artery disease admitted for right angiography on 06/20/2015 with atherectomy and drug-eluting balloon angioplasty of right SFA. Postoperatively patient became hypertensive and had leukocytosis with elevated lactate of 2. Placed on empiric antibiotic and given IV hydration. On the evening of 3/28 hemoglobin dropped to 7.3 and given 1 unit PRBC. While being transfused he had low-grade fever and subsequently became persistently hypotensive, was transferred to ICU. Patient was placed on IV hydrocortisone and given IV fluid resuscitation, improved subsequently and transferred to telemetry. On 06/24/15, the patient was transferred back to the SDU secondary to acute hypoxic respiratory failure requiring BiPAP.  Assessment/Plan:   Principle Problems: Sepsis with hypotension with hypovolemia/dehydration status post procedure (abdominal aortogram with bifemoral runoff and angioplasty of the right SFA)/probable postobstructive pneumonia Initial concern was for infection (lactic acid and Pro calcitonin both elevated on admission), but cultures have remained negative. The patient has improved on empiric antibiotics (status post 5 days of Zosyn/vancomycin, now on Levaquin) to treat a possible postobstructive pneumonia given right upper lobe mass, stress dose steroids and IV fluids. He remains off blood pressure medications. Cortisol was within normal limits at 12.8. Stress dose steroids have now been weaned and the patient's blood pressure has remained stable. WBC count has steadily risen, possibly from a leukemoid reaction (paraneoplastic from underlying malignancy). Chest x-ray stable. Antibiotics were stopped 06/28/15.  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 History of >40-pack-year smoking. Evaluated by Dr. Julien Nordmann 06/28/15. Currently not a candidate for treatment given poor performance status.  Acute on chronic hypoxic respiratory failure/acute diastolic CHF Multifactorial. Initially likely from sepsis, acute diastolic heart failure related to volume overload in the setting for need for fluid volume resuscitation. Developed acute respiratory failure overnight 06/24/15 and was transferred back to the stepdown unit placed on BiPAP. He was diuresed further and was weaned back to nasal cannula oxygen. EF 45-50 percent by echocardiogram done 04/2015. Cardiology following and assisting with management. Repeat chest x-ray done 06/27/15 showed a stable right upper lobe mass and improved surrounding edema.  Active Problems Cancer related pain Will need to transition to oral pain meds/patch for D/C home with hospice.  Start Fentanyl patch 12.5 mcg Q 3 days. CM consulted for Hospice referral.  COPD Continue Brovana and budesonide. Continue albuterol as needed for wheezing. He has an appt with Dr. Vaughan Browner scheduled for 07/13/15.  Severe peripheral artery disease High-grade disease a SFA status post angiography, atherectomy with drug-eluting balloon angioplasty of R SFA.  CAD with recent cardiac cath with 80% proximal RCA stenosis treated with DES/elevated troponin Continue aspirin/Plavix and statin. Had mild elevation of troponin 1. Cardiology following.  Iron deficiency anemia Status post 3 units of PRBCs. Continue iron supplement. Fecal occult blood testing pending.  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 Follows with outpatient neurology.  Tobacco abuse Counseled strongly on cessation. Nicotine patch.  Chronic kidney disease stage  III Stable.  Low back pain Patient be due to L1 compression fracture seen on imaging. Pain control and PT ordered. Bowel regimen started.  Non anion gap Metabolic acidosis Resolved. Discontinue bicarbonate  supplementation.  Fatigue and protein calorie malnutrition, likely severe / Failure to thrive / Cachexia Patient is very weak and has had a poor appetite for a long time, likely from metastatic lung cancer. He is losing a significant amount of weight and has muscle wasting. Tolerating Marinol for appetite stimulation. BMI 25.37. Dietitian evaluated 06/27/15. Nutritional supplements ordered.    Family Communication/Anticipated D/C date and plan/Code Status   Family Communication: Wife, daughter and son-in-law at the bedside. Disposition Plan/date: From home, and with newly diagnosed cancer, family wishes to take him home at D/C. We'll discuss the option of hospice care with family. Code Status: Full code.   Procedures and diagnostic studies:   Ct Abdomen Pelvis Wo Contrast  06/21/2015  CLINICAL DATA:  Back pain. Catheterization yesterday. Evaluate for retroperitoneal hematoma. EXAM: CT ABDOMEN AND PELVIS WITHOUT CONTRAST TECHNIQUE: Multidetector CT imaging of the abdomen and pelvis was performed following the standard protocol without IV contrast. COMPARISON:  05/05/2015 FINDINGS: Lower chest and abdominal wall: Fatty enlargement of the bilateral inguinal canal. There is a small right pleural effusion which is newly seen recent chest CT, with atelectatic type opacity in the right lower lobe. Hepatobiliary: Low-density foci best in the liver seen on prior enhanced study are not visible by this technique.Cholelithiasis. No signs of obstruction or inflammation. Pancreas: There is a 6 mm low-density mass in the uncinate process, doubtful clinical significance given the lung and left adrenal findings. This will be followed with the adrenal mass. Spleen: Unremarkable. Adrenals/Urinary Tract:  Continued enlargement of a left adrenal mass which has cystic density but mural complexity on prior enhanced CT. Mass measures up to 85 mm today. Cortical retention and continued excretion of IV contrast from catheterization yesterday. Although initially concerning for ATN there is stable creatinine clearance. No hydronephrosis. Negative bladder. Reproductive:No pathologic findings. Stomach/Bowel: Suspect proximal gastric diverticulum containing high-density material. Prominent volume of formed stool. No obstruction. No appendicitis. Vascular/Lymphatic: No retroperitoneal hematoma. Diffuse atherosclerosis No mass or adenopathy. Peritoneal: No ascites or pneumoperitoneum. Musculoskeletal: L1 superior endplate fracture with mild depression, stable from chest CT 06/08/2015. No visualized osseous metastasis. Chronic L5 right pars defect. IMPRESSION: 1. Negative for retroperitoneal hematoma. 2. Trace right pleural effusion with lower lobe atelectasis. 3. Continued growth of the cystic left adrenal mass, now ~8 cm, metastasis favored. 4. L1 superior endplate fracture with stable depression since 06/08/2015. 5. Probable developing constipation. Electronically Signed   By: Monte Fantasia M.D.   On: 06/21/2015 12:54   Dg Lumbar Spine Complete  06/08/2015  CLINICAL DATA:  Low back pain for several days.  No injury. EXAM: LUMBAR SPINE - COMPLETE 4+ VIEW COMPARISON:  CT abdomen and pelvis 05/05/2015 FINDINGS: There are 5 non rib-bearing lumbar type vertebral bodies. Vertebral alignment is normal. Vertebral body heights are preserved. There is minimal disc space narrowing at L5-S1, with vacuum disc phenomenon noted on the prior CT. Disc space heights are preserved elsewhere. A right L5 pars defect is noted. Atherosclerotic aortic calcification is noted. IMPRESSION: 1. No acute osseous abnormality identified. 2. 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 Minimal L5-S1 disc narrowing. Electronically Signed   By: Logan Bores M.D.    On: 06/08/2015 13:07   Ct Chest Wo Contrast  06/08/2015  CLINICAL DATA:  70 year old male with cavitary right upper lobe lung mass presenting for follow-up status post antibiotic therapy. EXAM: CT CHEST WITHOUT CONTRAST TECHNIQUE: Multidetector CT imaging of the chest was performed following the standard protocol without IV contrast. COMPARISON:  05/04/2015 chest CT .  FINDINGS: Mediastinum/Nodes: Normal heart size. No pericardial fluid/thickening. Extensive coronary atherosclerosis. Atherosclerotic nonaneurysmal thoracic aorta. Stable dilated main pulmonary artery (3.1 cm diameter). Normal visualized thyroid. Normal esophagus. No axillary adenopathy. No pathologically enlarged mediastinal lymph nodes. Stable mildly enlarged 1.4 cm right hilar node (series 2/ image 29). No gross left hilar adenopathy on this noncontrast study. Lungs/Pleura: No pneumothorax. No pleural effusion. There are layering inspissated secretions throughout the lower trachea and bilateral mainstem bronchi. There is mild centrilobular emphysema and diffuse bronchial wall thickening. There is a large 8.9 x 6.8 cm lung mass in the posterior right upper lobe (series 3/ image 25), which abuts the visceral pleural and right major fissure, previously measuring 7.6 x 5.8 cm on 05/04/2015, significantly increased in size. Please note that the mass significantly distorts the superior aspect of the right major fissure in the mass may traverse the fissure into the superior segment of the right lower lobe. There is patchy consolidation, ground-glass opacity and reticulation throughout the dependent and basilar right upper lobe surrounding the right upper lobe mass, not definitely changed. Solid 1.2 cm lingular pulmonary nodule (series 3/image 35), previously 1.1 cm on 05/04/2015 and 0.8 cm on 01/17/2009, not definitely changed in the interval. New atelectasis in the posterior lingula. No new significant pulmonary nodules. Upper abdomen: Partial  visualization of large left adrenal mass measuring at least 7.1 x 6.1 cm, previously 6.3 x 5.8 cm, increased in size. Musculoskeletal: There is new lucency and irregularity of the visualize superior endplate of the L1 vertebral body, cannot exclude L1 fracture. IMPRESSION: 1. 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 2. No appreciable change in nonspecific patchy consolidation, ground-glass opacity and reticulation throughout the posterior/basilar right upper lobe surrounding the lung mass, which could represent alveolar hemorrhage and/or lymphangitic tumor spread. 3. Solid 1.2 cm lingular pulmonary nodule, slowly growing since 2010, indeterminate. 4. Stable mild right hilar lymphadenopathy, suspicious for nodal metastasis. No mediastinal lymphadenopathy. 5. 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 6. Recommend further evaluation of these findings with PET-CT. 7. New lucency and irregularity of the visualized superior endplate of the L1 vertebral body, cannot exclude L1 vertebral fracture. Consider correlation with lumbar spine MRI as clinically warranted. 8. Additional findings include coronary atherosclerosis, chronic main pulmonary artery dilation suggesting pulmonary arterial hypertension and mild emphysema. Electronically Signed   By: Ilona Sorrel M.D.   On: 06/08/2015 13:03   Ct Biopsy  06/24/2015  INDICATION: Enlarging posterior right upper lobe mass EXAM: CT-GUIDED BIOPSY LARGE RIGHT UPPER LOBE CENTRALLY NECROTIC MASS MEDICATIONS: 1% LIDOCAINE LOCALLY ANESTHESIA/SEDATION: 1.0 mg IV Versed; 25 mcg IV Fentanyl Moderate Sedation Time:  20 MINUTES The patient was continuously monitored during the  procedure by the interventional radiology nurse under my direct supervision. PROCEDURE: The procedure, risks, benefits, and alternatives were explained to the patient. Questions regarding the procedure were encouraged and answered. The patient understands and consents to the procedure. The right posterior back was prepped with ChloraPrep in a sterile fashion, and a sterile drape was applied covering the operative field. A sterile gown and sterile gloves were used for the procedure. Previous imaging reviewed. Patient positioned right side down decubitus. Noncontrast localization CT performed. The large posterior right upper lobe mass was localized. Under sterile conditions  and local anesthesia, a 17 gauge 6.8 cm access needle was advanced from a posterior intercostal approach to the periphery of the right upper lobe mass. Needle position confirmed with CT. 18 gauge core biopsies obtained and placed in formalin. Needle tract embolized with the biosentry device to aide in pneumothorax prevention. Postprocedure imaging demonstrates no complication. Patient tolerated the procedure well without complication. Vital sign monitoring by nursing staff during the procedure will continue as patient is in the special procedures unit for post procedure observation. FINDINGS: The images document guide needle placement within the large posterior right upper lobe mass. Post biopsy images demonstrate no effusion or pneumothorax. COMPLICATIONS: None immediate. IMPRESSION: Successful CT-guided core biopsy of the right upper lobe mass. Electronically Signed   By: Jerilynn Mages.  Shick M.D.   On: 06/24/2015 11:05   Dg Chest Port 1 View  06/28/2015  CLINICAL DATA:  Respiratory distress. History of lung cancer. Plain film of the chest 06/27/2015.EXAM: PORTABLE CHEST 1 VIEW COMPARISON:  CT chest 06/08/2015. FINDINGS: Large mass in the right chest is again seen. Prominence of the surrounding pulmonary interstitium is identified. The lungs are  emphysematous. The left lung appears clear. Heart size is normal. 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 Emphysema. Electronically Signed   By: Inge Rise M.D.   On: 06/28/2015 16:03   Dg Chest Port 1 View  06/27/2015  CLINICAL DATA:  Shortness of breath. EXAM: PORTABLE CHEST 1 VIEW COMPARISON:  06/24/2015. FINDINGS: Stable cardiomediastinal silhouette. Large RIGHT upper lobe mass is redemonstrated with surrounding interstitial edema, improved after Lasix. No pneumothorax. IMPRESSION: Stable RIGHT upper lobe mass.  Improved surrounding edema. Electronically Signed   By: Staci Righter M.D.   On: 06/27/2015 10:20   Dg Chest Port 1 View  06/24/2015  CLINICAL DATA:  Cavitating right upper lobe lung mass. EXAM: PORTABLE CHEST 1 VIEW COMPARISON:  06/22/2015 and CT 06/08/2015 FINDINGS: Examination demonstrates adequate lung volumes with no significant change in patient's known large right lung mass which has undergone recent percutaneous biopsy. There is less interstitial density adjacent to the mass. No significant effusion. No evidence of pneumothorax. Cardiomediastinal silhouette and remainder of the exam is unchanged. IMPRESSION: Stable known large right upper lobe lung mass post recent percutaneous biopsy. Near resolution of previously seen heterogeneous density adjacent the mass. Electronically Signed   By: Marin Olp M.D.   On: 06/24/2015 14:03   Dg Chest Port 1 View  06/22/2015  CLINICAL DATA:  Respiratory failure . EXAM: PORTABLE CHEST 1 VIEW COMPARISON:  06/20/2015.  CT 06/08/2015. FINDINGS: Mediastinum is stable. Heart size normal. Right upper lobe infiltrate/mass again noted. Again this could represent a pulmonary abscess or neoplasm. Improvement of right upper lobe atelectasis prior exam. Mild bibasilar atelectasis. No pleural effusion or pneumothorax . IMPRESSION: 1. Persistent rounded infiltrate/mass right upper  lobe. Again this could represent a pulmonary abscess or neoplasm. Improvement of right upper lobe atelectasis noted on today's exam. 2. Mild bibasilar atelectasis. Electronically Signed   By: Marcello Moores  Register   On: 06/22/2015 07:14   Dg Chest Port 1 View  06/20/2015  CLINICAL DATA:  Tachycardia, chills, elevated white blood cells EXAM: PORTABLE CHEST 1 VIEW COMPARISON:  06/08/2015 FINDINGS: There is right upper lobe consolidation. There is no pleural effusion or pneumothorax. The heart and mediastinal contours are unremarkable. The osseous structures are unremarkable. IMPRESSION: Right upper lobe consolidation most concerning for pneumonia. Given the masslike area in the  right upper lobe on previous CT of the chest dated 06/09/2015 this is concerning for possible intrapulmonary abscess. Electronically Signed   By: Kathreen Devoid   On: 06/20/2015 16:31   Dg Chest Port 1v Same Day  06/24/2015  CLINICAL DATA:  Congestive heart failure. Increase shortness of breath. Right lung mass. Biopsy earlier today. EXAM: PORTABLE CHEST 1 VIEW COMPARISON:  Plain films, including earlier today at 1349 hours. FINDINGS: Patient rotated right. Normal heart size with a tortuous thoracic aorta. No pleural effusion or pneumothorax. Right upper lobe lung mass again identified. There is patchy interstitial and airspace disease within the right upper, right lower, and left lower lobes. New. IMPRESSION: Multifocal interstitial and airspace disease, worse on the right. This is superimposed upon a known right upper lobe lung mass. Given time course of development, favored to represent pulmonary edema. Aspiration pneumonitis could have a similar appearance. No pneumothorax or other acute complication of biopsy. The right upper lobe opacity is felt unlikely to represent biopsy induced hemorrhage, given development of multi focal airspace disease. Electronically Signed   By: Abigail Miyamoto M.D.   On: 06/24/2015 19:12    Medical Consultants:     Pulmonology  Interventional Radiology  Anti-Infectives:   Levaquin 06/24/15--->06/28/15 Vancomycin/Zosyn 06/20/15---> 06/24/15   Subjective:   Cristian Moss continues to be weak but reports that his appetite has improved slightly. Remains tachypneic. Has occasional pain. Mildly short of breath at times.  Objective:    Filed Vitals:   06/28/15 2140 06/29/15 0019 06/29/15 0400 06/29/15 0723  BP:  117/72 141/82 120/65  Pulse:  99 97 101  Temp:  97.9 F (36.6 C) 98.3 F (36.8 C) 97.7 F (36.5 C)  TempSrc:  Oral Oral Oral  Resp:  '19 19 22  '$ Height:      Weight:   77 kg (169 lb 12.1 oz)   SpO2: 94% 95%  96%    Intake/Output Summary (Last 24 hours) at 06/29/15 0740 Last data filed at 06/29/15 8182  Gross per 24 hour  Intake      0 ml  Output    600 ml  Net   -600 ml   Filed Weights   06/27/15 0500 06/28/15 0500 06/29/15 0400  Weight: 80.2 kg (176 lb 12.9 oz) 77.293 kg (170 lb 6.4 oz) 77 kg (169 lb 12.1 oz)    Exam: Gen:  Frail, Remains weak Cardiovascular:  RRR, No M/R/G Respiratory:  Lungs with faint rhonchi bilaterally Gastrointestinal:  Abdomen soft, NT/ND, + BS Extremities:  Clubbing present, no edema   Data Reviewed:    Labs: Basic Metabolic Panel:  Recent Labs Lab 06/23/15 0420 06/24/15 0345 06/25/15 0251 06/26/15 0226 06/27/15 0246  NA 139 140 140 139 138  K 3.8 3.8 3.8 4.2 4.5  CL 116* 115* 114* 109 106  CO2 15* 16* 18* 22 25  GLUCOSE 175* 102* 102* 114* 109*  BUN '17 12 10 8 8  '$ CREATININE 1.03 0.98 0.73 0.93 1.02  CALCIUM 7.4* 7.6* 7.5* 7.5* 7.8*   GFR Estimated Creatinine Clearance: 68.6 mL/min (by C-G formula based on Cr of 1.02). Liver Function Tests: No results for input(s): AST, ALT, ALKPHOS, BILITOT, PROT, ALBUMIN in the last 168 hours. Coagulation profile  Recent Labs Lab 06/24/15 0345  INR 1.44    CBC:  Recent Labs Lab 06/23/15 0420 06/24/15 0345 06/25/15 0251 06/26/15 0226 06/27/15 0246  WBC 33.6* 29.3* 20.6*  22.2* 25.6*  NEUTROABS  --   --   --   --  20.3*  HGB 7.1* 7.5* 7.1* 7.0* 8.7*  HCT 22.0* 24.8* 22.5* 22.6* 26.7*  MCV 89.8 90.5 89.3 89.3 89.6  PLT 263 330 324 295 274   Cardiac Enzymes: No results for input(s): CKTOTAL, CKMB, CKMBINDEX, TROPONINI in the last 168 hours. CBG: No results for input(s): GLUCAP in the last 168 hours. Sepsis Labs:  Recent Labs Lab 06/23/15 0420 06/24/15 0345 06/25/15 0251 06/26/15 0226 06/27/15 0246  PROCALCITON 0.59 0.46  --   --   --   WBC 33.6* 29.3* 20.6* 22.2* 25.6*   Microbiology Recent Results (from the past 240 hour(s))  Culture, blood (x 2)     Status: None   Collection Time: 06/20/15  7:35 PM  Result Value Ref Range Status   Specimen Description BLOOD BLOOD LEFT FOREARM  Final   Special Requests BOTTLES DRAWN AEROBIC AND ANAEROBIC 5CC  Final   Culture NO GROWTH 5 DAYS  Final   Report Status 06/25/2015 FINAL  Final  Culture, blood (x 2)     Status: None   Collection Time: 06/20/15  7:50 PM  Result Value Ref Range Status   Specimen Description BLOOD RIGHT ARM  Final   Special Requests BOTTLES DRAWN AEROBIC AND ANAEROBIC 5CC  Final   Culture NO GROWTH 5 DAYS  Final   Report Status 06/25/2015 FINAL  Final  Urine culture     Status: None   Collection Time: 06/21/15 12:22 AM  Result Value Ref Range Status   Specimen Description URINE, CLEAN CATCH  Final   Special Requests Normal  Final   Culture 4,000 COLONIES/mL INSIGNIFICANT GROWTH  Final   Report Status 06/22/2015 FINAL  Final  MRSA PCR Screening     Status: None   Collection Time: 06/22/15  2:13 AM  Result Value Ref Range Status   MRSA by PCR NEGATIVE NEGATIVE Final    Comment:        The GeneXpert MRSA Assay (FDA approved for NASAL specimens only), is one component of a comprehensive MRSA colonization surveillance program. It is not intended to diagnose MRSA infection nor to guide or monitor treatment for MRSA infections.   MRSA PCR Screening     Status: None    Collection Time: 06/24/15  7:22 PM  Result Value Ref Range Status   MRSA by PCR NEGATIVE NEGATIVE Final    Comment:        The GeneXpert MRSA Assay (FDA approved for NASAL specimens only), is one component of a comprehensive MRSA colonization surveillance program. It is not intended to diagnose MRSA infection nor to guide or monitor treatment for MRSA infections.      Medications:   . antiseptic oral rinse  7 mL Mouth Rinse BID  . arformoterol  15 mcg Nebulization BID  . aspirin EC  81 mg Oral Daily  . atorvastatin  80 mg Oral q1800  . bromocriptine  2.5 mg Oral BID  . budesonide (PULMICORT) nebulizer solution  0.5 mg Nebulization BID  . clopidogrel  75 mg Oral Daily  . dronabinol  5 mg Oral BID AC  . feeding supplement (ENSURE ENLIVE)  237 mL Oral TID BM  . ferrous sulfate  325 mg Oral BID WC  . folic acid  1 mg Oral Daily  . levETIRAcetam  1,500 mg Oral Q12H  . Living Better with Heart Failure Book   Does not apply Once  . methylPREDNISolone (SOLU-MEDROL) injection  40 mg Intravenous BID  .  morphine injection  2 mg Intravenous Once  .  nicotine  14 mg Transdermal Daily  . pantoprazole  40 mg Oral BID  . phenytoin  50 mg Oral QHS  . phenytoin  300 mg Oral QHS  . polyethylene glycol  17 g Oral Daily  . potassium chloride  20 mEq Oral BID   Continuous Infusions:   Time spent: 1 hour with > 50% of time discussing Hospice care and prognosis.  Family is actively grieving and needs much support.   LOS: 7 days   [facility] Pager [phone]. If unable to reach me by pager, please call my cell phone at [phone].  *Please refer to [email or web address], password [email or web address] to get updated schedule on who will round on this patient, as hospitalists switch teams weekly. If 7PM-7AM, please contact night-coverage at [email or web address], password [email or web address] for any overnight needs.  06/29/2015, 7:40 AM

## 2015-06-29 NOTE — Progress Notes (Signed)
Patient Name: Cristian Moss Date of Encounter: 06/29/2015  Principal Problem:   Sepsis [facility]) Active Problems:   Essential hypertension   Seizure disorder ([place])   Elevated troponin   Normocytic anemia   Tobacco abuse   CKD (chronic kidney disease), stage III   Cavitating mass in right upper lung lobe   Left adrenal mass (HCC)   Coronary artery disease due to lipid rich plaque   Claudication (HCC)   Dehydration   Low back pain   Chronic obstructive pulmonary disease (HCC)   PAD (peripheral artery disease) (HCC)   Back pain   Hypotension   CAD in native artery   Acute respiratory failure with hypoxia (HCC)   Acute diastolic heart failure (HCC)   Postobstructive pneumonia   Metastatic lung cancer (metastasis from lung to other site) ([place])   Cachexia (HCC)   Failure to thrive in adult   Centrilobular emphysema ([place])   Non-small cell lung cancer ([place])   Patient Profile:   Ram Haugan is a 70 y.o. male with a history of hypertension, tobacco abuse, and stage  II-III chronic kidney disease. 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 He has a RUL mass which was recently found in Feb. 2017.  He had left heart cath on 05/06/15, he received DES to RCA. He was admitted for LE angiography on 06/20/15.  He had hypotension, leukocytosis, and mildly elevated lactate following the procedure. Found to have cavitating lung mass.      SUBJECTIVE:  Underwent biopsy of lung mass on Friday. He was given diagnosis of metastatic lung cancer and is very depressed about it. He says that he still has shortness of breath but is improved. No chest pain.    OBJECTIVE Filed Vitals:   06/29/15 0019 06/29/15 0400 06/29/15 0723 06/29/15 0748  BP: 117/72 141/82 120/65   Pulse: 99 97 101   Temp: 97.9 F (36.6 C) 98.3 F (36.8 C) 97.7 F (36.5 C)   TempSrc: Oral Oral Oral   Resp: '19 19 22   '$ Height:      Weight:  169 lb  12.1 oz (77 kg)    SpO2: 95%  96% 95%    Intake/Output Summary (Last 24 hours) at 06/29/15 0850 Last data filed at 06/29/15 0629  Gross per 24 hour  Intake      0 ml  Output    600 ml  Net   -600 ml   Filed Weights   06/27/15 0500 06/28/15 0500 06/29/15 0400  Weight: 176 lb 12.9 oz (80.2 kg) 170 lb 6.4 oz (77.293 kg) 169 lb 12.1 oz (77 kg)   PHYSICAL EXAM General: Cachetic. Frail appearing Head: Normocephalic, atraumatic.  Neck: Supple without bruits, JVP 8-9 Lungs:  clear Heart: RRR, S1, S2, no S3, S4, or murmur; no rub. Abdomen: Soft, non-tender, non-distended, BS + x 4.  Extremities: No cyanosis, no edema. + clubbing Neuro: Alert and oriented X 3. Moves all extremities spontaneously. Psych: Normal affect.  LABS: CBC:  Recent Labs  06/27/15 0246  WBC 25.6*  NEUTROABS 20.3*  HGB 8.7*  HCT 26.7*  MCV 89.6  PLT 274    Recent Labs  06/27/15 0246  NA 138  K 4.5  CL 106  CO2 25  GLUCOSE 109*  BUN 8  CREATININE 1.02  CALCIUM 7.8*    Current facility-administered medications:  .  acetaminophen (TYLENOL) tablet 975 mg, 975  mg, Oral, Q6H PRN, Minus Breeding, MD, 975 mg at 06/22/15 1942 .  albuterol (PROVENTIL) (2.5 MG/3ML) 0.083% nebulizer solution 2.5 mg, 2.5 mg, Inhalation, Q3H PRN, Rahul P Desai, PA-C, 2.5 mg at 06/28/15 1505 .  antiseptic oral rinse (CPC / CETYLPYRIDINIUM CHLORIDE 0.05%) solution 7 mL, 7 mL, Mouth Rinse, BID, Almyra Deforest, PA, 7 mL at 06/28/15 2200 .  arformoterol (BROVANA) nebulizer solution 15 mcg, 15 mcg, Nebulization, BID, Rahul P Desai, PA-C, 15 mcg at 06/29/15 0747 .  aspirin EC tablet 81 mg, 81 mg, Oral, Daily, Troy Sine, MD, 81 mg at 06/28/15 0846 .  atorvastatin (LIPITOR) tablet 80 mg, 80 mg, Oral, q1800, Lorretta Harp, MD, 80 mg at 06/28/15 1801 .  bisacodyl (DULCOLAX) EC tablet 5 mg, 5 mg, Oral, Daily PRN, Lorretta Harp, MD, 5 mg at 06/24/15 1545 .  bromocriptine (PARLODEL) tablet 2.5 mg, 2.5 mg, Oral, BID, Lorretta Harp,  MD, 2.5 mg at 06/28/15 2131 .  budesonide (PULMICORT) nebulizer solution 0.5 mg, 0.5 mg, Nebulization, BID, Rahul P Desai, PA-C, 0.5 mg at 06/29/15 0747 .  clopidogrel (PLAVIX) tablet 75 mg, 75 mg, Oral, Daily, Arbutus Leas, NP, 75 mg at 06/28/15 0844 .  diazepam (VALIUM) tablet 5 mg, 5 mg, Oral, Q8H PRN, Venetia Maxon Rama, MD, 5 mg at 06/28/15 1448 .  dronabinol (MARINOL) capsule 5 mg, 5 mg, Oral, BID AC, Venetia Maxon Rama, MD, 5 mg at 06/28/15 1646 .  feeding supplement (ENSURE ENLIVE) (ENSURE ENLIVE) liquid 237 mL, 237 mL, Oral, TID BM, Venetia Maxon Rama, MD, 237 mL at 06/28/15 2100 .  fentaNYL (SUBLIMAZE) injection 25-50 mcg, 25-50 mcg, Intravenous, Q4H PRN, Nishant Dhungel, MD, 50 mcg at 06/29/15 0740 .  ferrous sulfate tablet 325 mg, 325 mg, Oral, BID WC, Lorretta Harp, MD, 325 mg at 06/29/15 1093 .  folic acid (FOLVITE) tablet 1 mg, 1 mg, Oral, Daily, Troy Sine, MD, 1 mg at 06/28/15 0846 .  levETIRAcetam (KEPPRA) tablet 1,500 mg, 1,500 mg, Oral, Q12H, Lorretta Harp, MD, 1,500 mg at 06/28/15 2130 .  Living Better with Heart Failure Book, , Does not apply, Once, Lorretta Harp, MD .  methylPREDNISolone sodium succinate (SOLU-MEDROL) 40 mg/mL injection 40 mg, 40 mg, Intravenous, BID, Venetia Maxon Rama, MD, 40 mg at 06/28/15 1640 .  morphine 2 MG/ML injection 2 mg, 2 mg, Intravenous, Once, Louellen Molder, MD, Stopped at 06/24/15 1530 .  nicotine (NICODERM CQ - dosed in mg/24 hours) patch 14 mg, 14 mg, Transdermal, Daily, Lorretta Harp, MD, 14 mg at 06/29/15 0823 .  nitroGLYCERIN (NITROSTAT) SL tablet 0.4 mg, 0.4 mg, Sublingual, Q5 min PRN, Lorretta Harp, MD .  ondansetron [facility]) injection 4 mg, 4 mg, Intravenous, Q6H PRN, Lorretta Harp, MD, 4 mg at 06/20/15 1445 .  oxyCODONE (Oxy IR/ROXICODONE) immediate release tablet 10 mg, 10 mg, Oral, Q4H PRN, Venetia Maxon Rama, MD, 10 mg at 06/28/15 2228 .  pantoprazole (PROTONIX) EC tablet 40 mg, 40 mg, Oral, BID, Lorretta Harp, MD,  40 mg at 06/28/15 2131 .  phenytoin (DILANTIN) chewable tablet 50 mg, 50 mg, Oral, QHS, Lorretta Harp, MD, 50 mg at 06/28/15 2131 .  phenytoin (DILANTIN) ER capsule 300 mg, 300 mg, Oral, QHS, Lorretta Harp, MD, 300 mg at 06/28/15 2130 .  polyethylene glycol (MIRALAX / GLYCOLAX) packet 17 g, 17 g, Oral, Daily, Nishant Dhungel, MD, 17 g at 06/28/15 0846 .  potassium chloride (K-DUR,KLOR-CON) CR tablet 20  mEq, 20 mEq, Oral, BID, Jolaine Artist, MD, 20 mEq at 06/28/15 2130   TELE:   NSR     Radiology/Studies: Dg Chest Port 1 View  06/28/2015  CLINICAL DATA:  Respiratory distress. History of lung cancer. Plain film of the chest 06/27/2015.EXAM: PORTABLE CHEST 1 VIEW COMPARISON:  CT chest 06/08/2015. FINDINGS: Large mass in the right chest is again seen. Prominence of the surrounding pulmonary interstitium is identified. The lungs are emphysematous. The left lung appears clear. Heart size is normal. 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 Emphysema. Electronically Signed   By: Inge Rise M.D.   On: 06/28/2015 16:03   Dg Chest Port 1 View  06/27/2015  CLINICAL DATA:  Shortness of breath. EXAM: PORTABLE CHEST 1 VIEW COMPARISON:  06/24/2015. FINDINGS: Stable cardiomediastinal silhouette. Large RIGHT upper lobe mass is redemonstrated with surrounding interstitial edema, improved after Lasix. No pneumothorax. IMPRESSION: Stable RIGHT upper lobe mass.  Improved surrounding edema. Electronically Signed   By: Staci Righter M.D.   On: 06/27/2015 10:20    Current Medications:  . antiseptic oral rinse  7 mL Mouth Rinse BID  . arformoterol  15 mcg Nebulization BID  . aspirin EC  81 mg Oral Daily  . atorvastatin  80 mg Oral q1800  . bromocriptine  2.5 mg Oral BID  . budesonide (PULMICORT) nebulizer solution  0.5 mg Nebulization BID  . clopidogrel  75 mg Oral Daily  . dronabinol  5 mg Oral BID AC  . feeding supplement  (ENSURE ENLIVE)  237 mL Oral TID BM  . ferrous sulfate  325 mg Oral BID WC  . folic acid  1 mg Oral Daily  . levETIRAcetam  1,500 mg Oral Q12H  . Living Better with Heart Failure Book   Does not apply Once  . methylPREDNISolone (SOLU-MEDROL) injection  40 mg Intravenous BID  .  morphine injection  2 mg Intravenous Once  . nicotine  14 mg Transdermal Daily  . pantoprazole  40 mg Oral BID  . phenytoin  50 mg Oral QHS  . phenytoin  300 mg Oral QHS  . polyethylene glycol  17 g Oral Daily  . potassium chloride  20 mEq Oral BID      ASSESSMENT AND PLAN: Principal Problem:   Sepsis ([place]) Active Problems:   Essential hypertension   Seizure disorder (HCC)   Elevated troponin   Normocytic anemia   Tobacco abuse   CKD (chronic kidney disease), stage III   Cavitating mass in right upper lung lobe   Left adrenal mass (HCC)   Coronary artery disease due to lipid rich plaque   Claudication (HCC)   Dehydration   Low back pain   Chronic obstructive pulmonary disease (HCC)   PAD (peripheral artery disease) (HCC)   Back pain   Hypotension   CAD in native artery   Acute respiratory failure with hypoxia (HCC)   Acute diastolic heart failure (HCC)   Postobstructive pneumonia   Metastatic lung cancer (metastasis from lung to other site) ([place])   Cachexia (HCC)   Failure to thrive in adult   Centrilobular emphysema (HCC)   Non-small cell lung cancer ([place])   1. Acute hypoxic respiratory failure post procedure 3/31 due to acute diastolic HF -improved with diuresis. IV lasix over the weekend, switched to PO yesterday, he is in sinus tachycardia and appears euvolemic, I would continue holding Lasix and give him 20 mg by  mouth daily when necessary as needed at home. - Crea normal  -EF 45-50% by echo 2/17  2. PAD: - doppler 05/31/2015 right ABI of 0.72 with high grade disease in his R SFA and L ABI 0.59 with occluded L  SFA - LE angiography 06/20/2015 [facility] 1 directional atherectomy with  drug eluding balloon angioplasty R SFA  3. SIRS with leukocytosis:         --improved. IM managing  4. Cavitating mass in RUL, note to have large 8.9x6.8 xm lung mass in posterior R upper lobe: - s/p biopsy 3/31, metastatic lung CA  5. CAD s/p DES to RCA: - cath 05/06/2015 with 80% prox RCA treated with DES, 65% 1st Marginal treated medically.  - echo 05/04/2015 EF 45-50%, hypokinesis of apicalanteroseptal myocardium, PA peak pressure 39mHg  6. Worsening anemia:  - suspect possibly due to his malignancy. Ct negative for bleed.     - started on po iron. Had transfusion yesterday, Hb improved 7--> 8.3  The patient is being considered for possible hospice versus palliative care, he has recovered from his acute heart failure we will sign off at this point call [place] you any questions. We will arrange for an outpatient follow-up.  NEna DawleyH,MD 8:50 AM

## 2015-06-29 NOTE — Progress Notes (Signed)
Physical Therapy Treatment Patient Details Name: Cristian Moss MRN: [id number] DOB: 05/18/1945 Today's Date: 06/29/2015    History of Present Illness Patient is a 70 yo male admitted 06/20/15 with claudication and is s/p Rt LE angiography, angioplasty Rt SFA.  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  CT showed L1 fracture.     PMH:  HTN, tobacco use, CKD, seizures, CAD, cardiac stent, DOE    PT Comments    Patient seen for OOB mobility this session. Patient with improvements in trunk control and EOB activity. Attempted pivot transfer to chair but patient unable to maintain LE positioning despite block out of knee flexion bilaterally. During transfer, controlled lowering toward the ground with body weight support on this therapists knee to prevent complete fall. Patient then assisted with +3 to elevate upward into chair and 2 person assist to reposition in chair. Recommend use of hoyer lift for return to bed. Family and patient declining SNF at this time, new equipment recommendations made based on disposition change.   Follow Up Recommendations  Home health PT;Supervision/Assistance - 24 hour (patient declining SNF)     Equipment Recommendations  Wheelchair (measurements PT);Wheelchair cushion (measurements PT);Hospital bed (hoyer lift)    Recommendations for Other Services       Precautions / Restrictions Precautions Precautions: Fall Restrictions Weight Bearing Restrictions: No    Mobility  Bed Mobility Overal bed mobility: Needs Assistance;+2 for physical assistance Bed Mobility: Supine to Sit;Sit to Supine     Supine to sit: Max assist;+2 for physical assistance Sit to supine: Max assist;+2 for physical assistance   General bed mobility comments: VCs for initiation and positioning, manual assist for hand palcement. Patient with increased effort and time to perform. Assist to elevate trunk to EOB and rotate hips. patient with  definitive left lateral lean.  Transfers Overall transfer level: Needs assistance   Transfers: Squat Pivot Transfers     Squat pivot transfers: Total assist;+2 physical assistance;+2 safety/equipment (2 person Face to face with chuck pad and gait belt)     General transfer comment: Attempted total assist squat pivot to chair, patient unable to maintain LE positioning despite block out of knee flexion bilaterally. During transfer, controlled lowering toward the ground with body weight support on this therapists knee to prevent complete fall. Patient then assisted with +3 to elevate upward into chair and 2 person assist to reposition in chair.   Ambulation/Gait                 Stairs            Wheelchair Mobility    Modified Rankin (Stroke Patients Only)       Balance     Sitting balance-Leahy Scale: Poor Sitting balance - Comments: able to sit EOB self support for periods of time this session. Also demonstrating improvements in dynamic trunk control. Postural control:  (decreased lateral lean)                          Cognition Arousal/Alertness: Awake/alert Behavior During Therapy: Flat affect Overall Cognitive Status: Within Functional Limits for tasks assessed                      Exercises      General Comments        Pertinent Vitals/Pain Faces Pain Scale: Hurts whole lot Pain Location: back Pain Descriptors / Indicators: Aching Pain  Intervention(s): Premedicated before session;Monitored during session;Limited activity within patient's tolerance    Home Living                      Prior Function            PT Goals (current goals can now be found in the care plan section) Acute Rehab PT Goals Patient Stated Goal: None stated PT Goal Formulation: With patient Time For Goal Achievement: 08-07-15 Potential to Achieve Goals: Fair Progress towards PT goals: Progressing toward goals (modest)    Frequency  Min  3X/week    PT Plan Discharge plan needs to be updated    Co-evaluation             End of Session Equipment Utilized During Treatment: Oxygen (5 liters O2 with saturations >92%) Activity Tolerance: Patient limited by fatigue;Patient limited by pain Patient left: in chair;with call bell/phone within reach;with chair alarm set;with SCD's reapplied     Time: 3748-2707 PT Time Calculation (min) (ACUTE ONLY): 25 min  Charges:  $Therapeutic Activity: 23-37 mins                    G CodesDuncan Dull 07/28/15, 9:52 AM Alben Deeds, PT DPT  [phone]

## 2015-06-29 NOTE — Care Management Note (Signed)
Case Management Note  Patient Details  Name: Cristian Moss MRN: [id number] Date of Birth: Feb 26, 1946    Subjective/Objective:    After meeting with oncology and attending, family has decided on home with hospice care.  CM and attending met with spouse, dtr, and son-in-law and discussed hospice services.  Provided list of agencies and referral made to [place] per dtr's choice.   [facility] geologist, Bambi, will meet with family this afternoon.                          Expected Discharge Plan:  [place]  Discharge planning Services  CM Consult  Post Acute Care Choice:  Durable Medical Equipment, Home Health Choice offered to:  Adult Children  DME Arranged:  Hospital bed, Wheelchair manual DME Agency:  [place]:  RN, Nurse's Aide, Social Work [employer] Agency:  Other - See comment  Status of Service:  In process, will continue to follow  Medicare Important Message Given:  Yes  Girard Cooter, RN 06/29/2015, 10:11 AM

## 2015-06-30 DIAGNOSIS — R7989 Other specified abnormal findings of blood chemistry: Secondary | ICD-10-CM

## 2015-06-30 MED ORDER — NICOTINE 14 MG/24HR TD PT24: 14.0000 mg | MEDICATED_PATCH | Freq: Every day | TRANSDERMAL | Status: AC

## 2015-06-30 MED ORDER — FUROSEMIDE 20 MG PO TABS: 20.0000 mg | ORAL_TABLET | Freq: Every day | ORAL | Status: AC

## 2015-06-30 MED ORDER — DIPHENOXYLATE-ATROPINE 2.5-0.025 MG PO TABS: 2.0000 | ORAL_TABLET | Freq: Four times a day (QID) | ORAL | Status: DC | PRN

## 2015-06-30 MED ORDER — FUROSEMIDE 20 MG PO TABS: 20.0000 mg | ORAL_TABLET | Freq: Every day | ORAL | Status: DC

## 2015-06-30 MED ORDER — DIPHENOXYLATE-ATROPINE 2.5-0.025 MG PO TABS
2.0000 | ORAL_TABLET | Freq: Once | ORAL | Status: AC
Start: 2015-06-30 — End: 2015-06-30
  Administered 2015-06-30: 2 via ORAL
  Filled 2015-06-30: qty 2

## 2015-06-30 MED ORDER — DIAZEPAM 5 MG PO TABS: 5.0000 mg | ORAL_TABLET | Freq: Three times a day (TID) | ORAL | Status: AC | PRN

## 2015-06-30 MED ORDER — FENTANYL 25 MCG/HR TD PT72
25.0000 ug | MEDICATED_PATCH | TRANSDERMAL | Status: DC
  Administered 2015-06-30: 25 ug via TRANSDERMAL
  Filled 2015-06-30: qty 1

## 2015-06-30 MED ORDER — DIPHENOXYLATE-ATROPINE 2.5-0.025 MG PO TABS: 2.0000 | ORAL_TABLET | Freq: Once | ORAL | Status: AC

## 2015-06-30 MED ORDER — FENTANYL 25 MCG/HR TD PT72: 25.0000 ug | MEDICATED_PATCH | TRANSDERMAL | Status: AC

## 2015-06-30 MED ORDER — FENTANYL 25 MCG/HR TD PT72: 25.0000 ug | MEDICATED_PATCH | TRANSDERMAL | Status: DC

## 2015-06-30 MED ORDER — ARFORMOTEROL TARTRATE 15 MCG/2ML IN NEBU: 15.0000 ug | INHALATION_SOLUTION | Freq: Two times a day (BID) | RESPIRATORY_TRACT | Status: AC

## 2015-06-30 MED ORDER — PREDNISONE 20 MG PO TABS: 20.0000 mg | ORAL_TABLET | Freq: Every day | ORAL | Status: AC

## 2015-06-30 MED ORDER — DRONABINOL 5 MG PO CAPS: 5.0000 mg | ORAL_CAPSULE | Freq: Two times a day (BID) | ORAL | Status: AC

## 2015-06-30 MED ORDER — OXYCODONE HCL 5 MG PO TABS: 5.0000 mg | ORAL_TABLET | Freq: Four times a day (QID) | ORAL | Status: AC | PRN

## 2015-06-30 MED ORDER — BUDESONIDE 0.5 MG/2ML IN SUSP: 0.5000 mg | Freq: Two times a day (BID) | RESPIRATORY_TRACT | Status: AC

## 2015-06-30 NOTE — Care Management Important Message (Signed)
Important Message  Patient Details  Name: Cristian Moss MRN: [id number] Date of Birth: 1946-03-23   Medicare Important Message Given:  Yes    Barb Merino Tayja Manzer 06/30/2015, 12:38 PM

## 2015-06-30 NOTE — Progress Notes (Signed)
Nutrition Follow-up  DOCUMENTATION CODES:   Not applicable  INTERVENTION:   -Continue Ensure Enlive po TID, each supplement provides 350 kcal and 20 grams of protein  NUTRITION DIAGNOSIS:   Inadequate oral intake related to poor appetite as evidenced by meal completion < 50%.  Ongoing  GOAL:   Patient will meet greater than or equal to 90% of their needs  Progressing  MONITOR:   PO intake, Supplement acceptance, Labs, Weight trends, Skin, I & O's  REASON FOR ASSESSMENT:   Consult Assessment of nutrition requirement/status  ASSESSMENT:   Cristian Moss is a 70 y.o. male with a history of hypertension, tobacco abuse, and stage II-III chronic kidney disease. 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  Pt received diagnosis of metastatic lung cancer on 06/28/15, per pathology confirmation. Reviewed oncology note from 06/28/15; pt with poor performance status. Plan is for pt to transition to home hospice services.   Pt in with multiple healthcare providers at time of visit (RN and MD). Staff report pt with flat affect due to recent diagnosis. Meal completion remains poor; PO 0-50% (averaging 25% intake). Pt has been refusing Ensure supplements. Suspect nutritional decline will be ongoing given transition to hospice status.   Labs reviewed.   Diet Order:  Diet Heart Room service appropriate?: Yes; Fluid consistency:: Thin Diet - low sodium heart healthy  Skin:  Reviewed, no issues  Last BM:  06/30/15  Height:   Ht Readings from Last 1 Encounters:  06/24/15 '5\' 10"'$  (1.778 m)    Weight:   Wt Readings from Last 1 Encounters:  06/30/15 172 lb (78.019 kg)    Ideal Body Weight:  75.45 kg  BMI:  Body mass index is 24.68 kg/(m^2).  Estimated Nutritional Needs:   Kcal:  2000-2200  Protein:  100-115 grams  Fluid:  >2.0 L  EDUCATION NEEDS:   No education needs identified at this  time  Preesha Benjamin A. Jimmye Norman, RD, LDN, CDE Pager: [phone] After hours Pager: [phone]

## 2015-06-30 NOTE — Progress Notes (Signed)
Patient Name: Konrad Hoak Date of Encounter: 06/30/2015  Principal Problem:   Sepsis [facility]) Active Problems:   Essential hypertension   Seizure disorder ([place])   Elevated troponin   Normocytic anemia   Tobacco abuse   CKD (chronic kidney disease), stage III   Cavitating mass in right upper lung lobe   Left adrenal mass (HCC)   Coronary artery disease due to lipid rich plaque   Fatigue   Claudication (HCC)   Dehydration   Low back pain   Chronic obstructive pulmonary disease (HCC)   PAD (peripheral artery disease) (HCC)   Back pain   Hypotension   CAD in native artery   Acute respiratory failure with hypoxia (HCC)   Acute diastolic heart failure (HCC)   Postobstructive pneumonia   Metastatic lung cancer (metastasis from lung to other site) ([place])   Cachexia (HCC)   Failure to thrive in adult   Centrilobular emphysema (HCC)   Non-small cell lung cancer ([place])   Cancer associated pain   Patient Profile:   Tarvaris Puglia is a 70 y.o. male with a history of hypertension, tobacco abuse, and stage  II-III chronic kidney disease. 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 He has a RUL mass which was recently found in Feb. 2017.  He had left heart cath on 05/06/15, he received DES to RCA. He was admitted for LE angiography on 06/20/15.  He had hypotension, leukocytosis, and mildly elevated lactate following the procedure. Found to have cavitating lung mass.      SUBJECTIVE:  Underwent biopsy of lung mass on Friday. He was given diagnosis of metastatic lung cancer and is very depressed about it.  He feels SOB, able to lay flat.   OBJECTIVE Filed Vitals:   06/30/15 0000 06/30/15 0400 06/30/15 0758 06/30/15 0825  BP: 122/70 115/76  113/67  Pulse: 96 95  95  Temp:  98.3 F (36.8 C)  98.1 F (36.7 C)  TempSrc:  Oral  Oral  Resp: 23   22  Height:      Weight:  172 lb (78.019 kg)    SpO2: 90% 94% 95%  99%    Intake/Output Summary (Last 24 hours) at 06/30/15 0947 Last data filed at 06/30/15 0836  Gross per 24 hour  Intake    180 ml  Output    550 ml  Net   -370 ml   Filed Weights   06/28/15 0500 06/29/15 0400 06/30/15 0400  Weight: 170 lb 6.4 oz (77.293 kg) 169 lb 12.1 oz (77 kg) 172 lb (78.019 kg)   PHYSICAL EXAM General: Cachetic. Frail appearing Head: Normocephalic, atraumatic.  Neck: Supple without bruits, JVP 8-9 Lungs:  clear Heart: RRR, S1, S2, no S3, S4, or murmur; no rub. Abdomen: Soft, non-tender, non-distended, BS + x 4.  Extremities: No cyanosis, no edema. + clubbing Neuro: Alert and oriented X 3. Moves all extremities spontaneously. Psych: Normal affect.  LABS: CBC: No results for input(s): WBC, NEUTROABS, HGB, HCT, MCV, PLT in the last 72 hours. No results for input(s): NA, K, CL, CO2, GLUCOSE, BUN, CREATININE, CALCIUM, MG, PHOS in the last 72 hours.  Current facility-administered medications:  .  acetaminophen (TYLENOL) tablet 975 mg, 975 mg, Oral, Q6H PRN, Minus Breeding, MD, 975 mg at 06/22/15 1942 .  albuterol (PROVENTIL) (2.5 MG/3ML) 0.083% nebulizer solution 2.5 mg, 2.5 mg, Inhalation, Q3H PRN, Rahul P Desai, PA-C, 2.5 mg at  06/28/15 1505 .  antiseptic oral rinse (CPC / CETYLPYRIDINIUM CHLORIDE 0.05%) solution 7 mL, 7 mL, Mouth Rinse, BID, Almyra Deforest, PA, 7 mL at 06/29/15 2200 .  arformoterol (BROVANA) nebulizer solution 15 mcg, 15 mcg, Nebulization, BID, Rahul P Desai, PA-C, 15 mcg at 06/30/15 0757 .  aspirin EC tablet 81 mg, 81 mg, Oral, Daily, Troy Sine, MD, 81 mg at 06/29/15 1038 .  atorvastatin (LIPITOR) tablet 80 mg, 80 mg, Oral, q1800, Lorretta Harp, MD, 80 mg at 06/29/15 1633 .  bisacodyl (DULCOLAX) EC tablet 5 mg, 5 mg, Oral, Daily PRN, Lorretta Harp, MD, 5 mg at 06/24/15 1545 .  bromocriptine (PARLODEL) tablet 2.5 mg, 2.5 mg, Oral, BID, Lorretta Harp, MD, 2.5 mg at 06/29/15 2123 .  budesonide (PULMICORT) nebulizer solution 0.5 mg, 0.5  mg, Nebulization, BID, Rahul P Desai, PA-C, 0.5 mg at 06/30/15 0757 .  clopidogrel (PLAVIX) tablet 75 mg, 75 mg, Oral, Daily, Arbutus Leas, NP, 75 mg at 06/29/15 1038 .  diazepam (VALIUM) tablet 5 mg, 5 mg, Oral, Q8H PRN, Venetia Maxon Rama, MD, 5 mg at 06/28/15 1448 .  dronabinol (MARINOL) capsule 5 mg, 5 mg, Oral, BID AC, Venetia Maxon Rama, MD, 5 mg at 06/29/15 1633 .  feeding supplement (ENSURE ENLIVE) (ENSURE ENLIVE) liquid 237 mL, 237 mL, Oral, TID BM, Venetia Maxon Rama, MD, 237 mL at 06/28/15 2100 .  fentaNYL (DURAGESIC - dosed mcg/hr) 12.5 mcg, 12.5 mcg, Transdermal, Q72H, Venetia Maxon Rama, MD, 12.5 mcg at 06/29/15 1038 .  fentaNYL (SUBLIMAZE) injection 25-50 mcg, 25-50 mcg, Intravenous, Q4H PRN, Nishant Dhungel, MD, 50 mcg at 06/30/15 0721 .  ferrous sulfate tablet 325 mg, 325 mg, Oral, BID WC, Lorretta Harp, MD, 325 mg at 06/30/15 3810 .  folic acid (FOLVITE) tablet 1 mg, 1 mg, Oral, Daily, Troy Sine, MD, 1 mg at 06/29/15 1038 .  levETIRAcetam (KEPPRA) tablet 1,500 mg, 1,500 mg, Oral, Q12H, Lorretta Harp, MD, 1,500 mg at 06/29/15 2123 .  Living Better with Heart Failure Book, , Does not apply, Once, Lorretta Harp, MD .  methylPREDNISolone sodium succinate (SOLU-MEDROL) 40 mg/mL injection 40 mg, 40 mg, Intravenous, BID, Venetia Maxon Rama, MD, 40 mg at 06/29/15 2122 .  morphine 2 MG/ML injection 2 mg, 2 mg, Intravenous, Once, Louellen Molder, MD, Stopped at 06/24/15 1530 .  nicotine (NICODERM CQ - dosed in mg/24 hours) patch 14 mg, 14 mg, Transdermal, Daily, Lorretta Harp, MD, 14 mg at 06/29/15 0823 .  nitroGLYCERIN (NITROSTAT) SL tablet 0.4 mg, 0.4 mg, Sublingual, Q5 min PRN, Lorretta Harp, MD .  ondansetron [facility]) injection 4 mg, 4 mg, Intravenous, Q6H PRN, Lorretta Harp, MD, 4 mg at 06/20/15 1445 .  oxyCODONE (Oxy IR/ROXICODONE) immediate release tablet 10 mg, 10 mg, Oral, Q4H PRN, Venetia Maxon Rama, MD, 10 mg at 06/30/15 0835 .  pantoprazole (PROTONIX) EC tablet 40 mg,  40 mg, Oral, BID, Lorretta Harp, MD, 40 mg at 06/29/15 2123 .  phenytoin (DILANTIN) chewable tablet 50 mg, 50 mg, Oral, QHS, Lorretta Harp, MD, 50 mg at 06/29/15 2123 .  phenytoin (DILANTIN) ER capsule 300 mg, 300 mg, Oral, QHS, Lorretta Harp, MD, 300 mg at 06/29/15 2123 .  polyethylene glycol (MIRALAX / GLYCOLAX) packet 17 g, 17 g, Oral, Daily PRN, Venetia Maxon Rama, MD .  potassium chloride (K-DUR,KLOR-CON) CR tablet 20 mEq, 20 mEq, Oral, BID, Jolaine Artist, MD, 20 mEq at 06/29/15 2123  TELE:   NSR     Radiology/Studies: Dg Chest Port 1 View  06/28/2015  CLINICAL DATA:  Respiratory distress. History of lung cancer. Plain film of the chest 06/27/2015.EXAM: PORTABLE CHEST 1 VIEW COMPARISON:  CT chest 06/08/2015. FINDINGS: Large mass in the right chest is again seen. Prominence of the surrounding pulmonary interstitium is identified. The lungs are emphysematous. The left lung appears clear. Heart size is normal. 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 Emphysema. Electronically Signed   By: Inge Rise M.D.   On: 06/28/2015 16:03    Current Medications:  . antiseptic oral rinse  7 mL Mouth Rinse BID  . arformoterol  15 mcg Nebulization BID  . aspirin EC  81 mg Oral Daily  . atorvastatin  80 mg Oral q1800  . bromocriptine  2.5 mg Oral BID  . budesonide (PULMICORT) nebulizer solution  0.5 mg Nebulization BID  . clopidogrel  75 mg Oral Daily  . dronabinol  5 mg Oral BID AC  . feeding supplement (ENSURE ENLIVE)  237 mL Oral TID BM  . fentaNYL  12.5 mcg Transdermal Q72H  . ferrous sulfate  325 mg Oral BID WC  . folic acid  1 mg Oral Daily  . levETIRAcetam  1,500 mg Oral Q12H  . Living Better with Heart Failure Book   Does not apply Once  . methylPREDNISolone (SOLU-MEDROL) injection  40 mg Intravenous BID  .  morphine injection  2 mg Intravenous Once  . nicotine  14 mg Transdermal Daily  .  pantoprazole  40 mg Oral BID  . phenytoin  50 mg Oral QHS  . phenytoin  300 mg Oral QHS  . potassium chloride  20 mEq Oral BID      ASSESSMENT AND PLAN: Principal Problem:   Sepsis ([place]) Active Problems:   Essential hypertension   Seizure disorder (HCC)   Elevated troponin   Normocytic anemia   Tobacco abuse   CKD (chronic kidney disease), stage III   Cavitating mass in right upper lung lobe   Left adrenal mass (HCC)   Coronary artery disease due to lipid rich plaque   Fatigue   Claudication (HCC)   Dehydration   Low back pain   Chronic obstructive pulmonary disease (HCC)   PAD (peripheral artery disease) (HCC)   Back pain   Hypotension   CAD in native artery   Acute respiratory failure with hypoxia (HCC)   Acute diastolic heart failure (HCC)   Postobstructive pneumonia   Metastatic lung cancer (metastasis from lung to other site) (HCC)   Cachexia (HCC)   Failure to thrive in adult   Centrilobular emphysema (HCC)   Non-small cell lung cancer (HCC)   Cancer associated pain   1. Acute hypoxic respiratory failure post procedure 3/31 due to acute diastolic HF -improved with diuresis. IV lasix over the weekend, then held as he has minimal PO intake. The patient appears euvolemic, his SOB is related to underlying emphysema and lung cancer. - He can be discharged from cardiac standpoint on maintenance Lasix 20 mg daily when necessary, family educated on how to use it. They understand. Patient refuses to go to skilled nursing facility and is going to be discharged home.  We will sign off at this point, call us if you have any further questions.    -EF 45-50% by echo 2/17  2. PAD: - doppler 05/31/2015 right ABI of 0.72 with high grade disease  in his R SFA and L ABI 0.59 with occluded L  SFA - LE angiography 06/20/2015 [facility] 1 directional atherectomy with drug eluding balloon angioplasty R SFA  3. SIRS with leukocytosis:         --improved. IM managing  4. Cavitating  mass in RUL, note to have large 8.9x6.8 xm lung mass in posterior R upper lobe: - s/p biopsy 3/31, metastatic lung CA  5. CAD s/p DES to RCA: - cath 05/06/2015 with 80% prox RCA treated with DES, 65% 1st Marginal treated medically.  - echo 05/04/2015 EF 45-50%, hypokinesis of apicalanteroseptal myocardium, PA peak pressure 17mHg  6. Worsening anemia:  - suspect possibly due to his malignancy. Ct negative for bleed.     - started on po iron. Had transfusion yesterday, Hb improved 7--> 8.3  Jannelle Notaro H,MD 9:47 AM

## 2015-06-30 NOTE — Progress Notes (Signed)
Pt being discharged home via transport. Pt alert and oriented x4. VSS. Pt c/o no pain at this time. No signs of respiratory distress. Education complete and care plans resolved. IV removed with catheter intact and pt tolerated well. No further issues at this time. Pt to follow up with PCP. Leanne Chang, RN

## 2015-06-30 NOTE — Discharge Summary (Addendum)
Physician Discharge Summary  Cristian Moss XBL:[id number] DOB: [birth year] DOA: 06/20/2015  PCP: [name], MD  Admit date: 06/20/2015 Discharge date: 06/30/2015   Recommendations for Outpatient Follow-Up:   1. The patient is being discharged with hospice services. Prognosis poor, can follow-up with Dr. Julien Nordmann to functional status improves.   Discharge Diagnosis:   Principal Problem:    Suspected Sepsis ([place]) versus SIRS from procedure Active Problems:    Essential hypertension    Seizure disorder (HCC)    Elevated troponin    Normocytic anemia    Tobacco abuse    CKD (chronic kidney disease), stage III    Cavitating mass in right upper lung lobe    Left adrenal mass (HCC)    Coronary artery disease due to lipid rich plaque    Fatigue    Claudication (HCC)    Dehydration    Low back pain    Chronic obstructive pulmonary disease (HCC)    PAD (peripheral artery disease) (HCC)    Back pain    Hypotension    CAD in native artery    Acute respiratory failure with hypoxia (HCC)    Acute diastolic heart failure (HCC)    Postobstructive pneumonia    Metastatic lung cancer (metastasis from lung to other site) (HCC)    Cachexia (HCC)    Failure to thrive in adult    Centrilobular emphysema (HCC)    Non-small cell lung cancer ([place])    Cancer associated pain   Discharge disposition:  Home with hospice  Discharge Condition: Stable but likely terminal.  Diet recommendation: Low sodium, heart healthy.    History of Present Illness:   Cristian Moss is an 70 y.o. male recently admitted with seizures and found to have right upper lobe mass, severe peripheral artery disease admitted for right angiography on 06/20/2015 with atherectomy and drug-eluting balloon angioplasty of right SFA. Postoperatively patient became hypertensive and had leukocytosis with elevated lactate of 2. Placed on empiric antibiotic and given IV hydration. On the evening  of 3/28 hemoglobin dropped to 7.3 and given 1 unit PRBC. While being transfused he had low-grade fever and subsequently became persistently hypotensive, was transferred to ICU. Patient was placed on IV hydrocortisone and given IV fluid resuscitation, improved subsequently and transferred to telemetry. On 06/24/15, the patient was transferred back to the SDU secondary to acute hypoxic respiratory failure requiring BiPAP.His breathing has remained intermittently tenuous, but has improved on steroids. 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  Hospital Course by Problem:   Principle Problems: Sepsis with hypotension with hypovolemia/dehydration status post procedure (abdominal aortogram with bifemoral runoff and angioplasty of the right SFA)/probable postobstructive pneumonia Initial concern was for infection (lactic acid and Pro calcitonin both elevated on admission), but cultures have remained negative. The patient has improved on empiric antibiotics (status post 5 days of Zosyn/vancomycin, followed by Levaquin for an additional 4 days) to treat a possible postobstructive pneumonia given right upper lobe mass, stress dose steroids and IV fluids. He remains off blood pressure medications. Cortisol was within normal limits at 12.8. Stress dose steroids have now been weaned and the patient's blood pressure has remained stable. WBC count has steadily risen, possibly from a leukemoid reaction (paraneoplastic from underlying malignancy). Chest x-ray stable. Antibiotics were stopped 06/28/15.  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 History of >40-pack-year smoking. Evaluated by Dr. Julien Nordmann 06/28/15. Currently not a candidate  for treatment given poor performance status, but can follow-up with Dr. Julien Nordmann as an outpatient if his performance status improves.  Acute on chronic hypoxic respiratory failure/acute diastolic CHF Multifactorial. Initially likely from sepsis, acute diastolic heart failure related to volume overload in the setting for need for fluid volume resuscitation. Developed acute respiratory failure overnight 06/24/15 and was transferred back to the stepdown unit placed on BiPAP. He was diuresed further and was weaned back to nasal cannula oxygen. EF 45-50 percent by echocardiogram done 04/2015. Cardiology following and assisting with management. Repeat chest x-ray done 06/27/15 showed a stable right upper lobe mass and improved surrounding edema. Additionally, placed back on Solu-Medrol for treatment of probable lymphangitic spread of lung cancer. We'll discharge on prednisone 20 mg daily.  Active Problems Cancer related pain Will need to transition to oral pain meds/patch for D/C home with hospice. Fentanyl patch was increased to 25 g prior to discharge. CM consulted for Hospice referral and equipment including hospital bed will be arranged for transition to home.  COPD Continue Brovana and budesonide. Continue albuterol as needed for wheezing. He has an appt with Dr. Vaughan Browner scheduled for 07/13/15.  Severe peripheral artery disease High-grade disease a SFA status post angiography, atherectomy with drug-eluting balloon angioplasty of R SFA.  CAD with recent cardiac cath with 80% proximal RCA stenosis treated with DES/elevated troponin Continue aspirin/Plavix and statin. Had mild elevation of troponin 1. Cardiology following. Recommends discharge him on Lasix daily as needed.  Iron deficiency anemia Status post 3 units of PRBCs. Continue iron supplement.  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 Follows with outpatient neurology.  Tobacco  abuse Counseled strongly on cessation. Nicotine patch provided.  Chronic kidney disease stage III Stable.  Low back pain Patient be due to L1 compression fracture seen on imaging. Pain control and PT ordered.   Non anion gap Metabolic acidosis Resolved with bicarbonate supplementation.  Fatigue and protein calorie malnutrition, likely severe / Failure to thrive / Cachexia Patient is very weak and has had a poor appetite for a long time, likely from metastatic lung cancer. He is losing a significant amount of weight and has muscle wasting. Tolerating Marinol for appetite stimulation. BMI 25.37. Dietitian evaluated 06/27/15. Nutritional supplements ordered.     Medical Consultants:    Oncology  Cardiology  Pulmonology   Discharge Exam:   Filed Vitals:   06/30/15 0825 06/30/15 1130  BP: 113/67 114/65  Pulse: 95 88  Temp: 98.1 F (36.7 C) 97.7 F (36.5 C)  Resp: 22 22   Filed Vitals:   06/30/15 0400 06/30/15 0758 06/30/15 0825 06/30/15 1130  BP: 115/76  113/67 114/65  Pulse: 95  95 88  Temp: 98.3 F (36.8 C)  98.1 F (36.7 C) 97.7 F (36.5 C)  TempSrc: Oral  Oral Oral  Resp:   22 22  Height:      Weight: 78.019 kg (172 lb)     SpO2: 94% 95% 99% 93%    Gen: Frail, Remains weak Cardiovascular: RRR, No M/R/G Respiratory: Lungs with faint rhonchi bilaterally Gastrointestinal: Abdomen soft, NT/ND, + BS Extremities: Clubbing present, no edema   The results of significant diagnostics from this hospitalization (including imaging, microbiology, ancillary and laboratory) are listed below for reference.     Procedures and Diagnostic Studies:   Ct Abdomen  Pelvis Wo Contrast  06/21/2015  CLINICAL DATA:  Back pain. Catheterization yesterday. Evaluate for retroperitoneal hematoma. EXAM: CT ABDOMEN AND PELVIS WITHOUT CONTRAST TECHNIQUE: Multidetector CT imaging of the abdomen and pelvis was performed following the standard protocol without IV contrast. COMPARISON:   05/05/2015 FINDINGS: Lower chest and abdominal wall: Fatty enlargement of the bilateral inguinal canal. There is a small right pleural effusion which is newly seen recent chest CT, with atelectatic type opacity in the right lower lobe. Hepatobiliary: Low-density foci best in the liver seen on prior enhanced study are not visible by this technique.Cholelithiasis. No signs of obstruction or inflammation. Pancreas: There is a 6 mm low-density mass in the uncinate process, doubtful clinical significance given the lung and left adrenal findings. This will be followed with the adrenal mass. Spleen: Unremarkable. Adrenals/Urinary Tract: Continued enlargement of a left adrenal mass which has cystic density but mural complexity on prior enhanced CT. Mass measures up to 85 mm today. Cortical retention and continued excretion of IV contrast from catheterization yesterday. Although initially concerning for ATN there is stable creatinine clearance. No hydronephrosis. Negative bladder. Reproductive:No pathologic findings. Stomach/Bowel: Suspect proximal gastric diverticulum containing high-density material. Prominent volume of formed stool. No obstruction. No appendicitis. Vascular/Lymphatic: No retroperitoneal hematoma. Diffuse atherosclerosis No mass or adenopathy. Peritoneal: No ascites or pneumoperitoneum. Musculoskeletal: L1 superior endplate fracture with mild depression, stable from chest CT 06/08/2015. No visualized osseous metastasis. Chronic L5 right pars defect. IMPRESSION: 1. Negative for retroperitoneal hematoma. 2. Trace right pleural effusion with lower lobe atelectasis. 3. Continued growth of the cystic left adrenal mass, now ~8 cm, metastasis favored. 4. L1 superior endplate fracture with stable depression since 06/08/2015. 5. Probable developing constipation. Electronically Signed   By: Monte Fantasia M.D.   On: 06/21/2015 12:54   Dg Chest Port 1 View  06/20/2015  CLINICAL DATA:  Tachycardia, chills,  elevated white blood cells EXAM: PORTABLE CHEST 1 VIEW COMPARISON:  06/08/2015 FINDINGS: There is right upper lobe consolidation. There is no pleural effusion or pneumothorax. The heart and mediastinal contours are unremarkable. The osseous structures are unremarkable. IMPRESSION: Right upper lobe consolidation most concerning for pneumonia. Given the masslike area in the right upper lobe on previous CT of the chest dated 06/09/2015 this is concerning for possible intrapulmonary abscess. Electronically Signed   By: Kathreen Devoid   On: 06/20/2015 16:31     Labs:   Basic Metabolic Panel:  Recent Labs Lab 06/24/15 0345 06/25/15 0251 06/26/15 0226 06/27/15 0246  NA 140 140 139 138  K 3.8 3.8 4.2 4.5  CL 115* 114* 109 106  CO2 16* 18* 22 25  GLUCOSE 102* 102* 114* 109*  BUN '12 10 8 8  '$ CREATININE 0.98 0.73 0.93 1.02  CALCIUM 7.6* 7.5* 7.5* 7.8*   GFR Estimated Creatinine Clearance: 68.6 mL/min (by C-G formula based on Cr of 1.02). Liver Function Tests: No results for input(s): AST, ALT, ALKPHOS, BILITOT, PROT, ALBUMIN in the last 168 hours. No results for input(s): LIPASE, AMYLASE in the last 168 hours. No results for input(s): AMMONIA in the last 168 hours. Coagulation profile  Recent Labs Lab 06/24/15 0345  INR 1.44    CBC:  Recent Labs Lab 06/24/15 0345 06/25/15 0251 06/26/15 0226 06/27/15 0246  WBC 29.3* 20.6* 22.2* 25.6*  NEUTROABS  --   --   --  20.3*  HGB 7.5* 7.1* 7.0* 8.7*  HCT 24.8* 22.5* 22.6* 26.7*  MCV 90.5 89.3 89.3 89.6  PLT 330 324 295 274  Microbiology Recent Results (from the past 240 hour(s))  Culture, blood (x 2)     Status: None   Collection Time: 06/20/15  7:35 PM  Result Value Ref Range Status   Specimen Description BLOOD BLOOD LEFT FOREARM  Final   Special Requests BOTTLES DRAWN AEROBIC AND ANAEROBIC 5CC  Final   Culture NO GROWTH 5 DAYS  Final   Report Status 06/25/2015 FINAL  Final  Culture, blood (x 2)     Status: None   Collection  Time: 06/20/15  7:50 PM  Result Value Ref Range Status   Specimen Description BLOOD RIGHT ARM  Final   Special Requests BOTTLES DRAWN AEROBIC AND ANAEROBIC 5CC  Final   Culture NO GROWTH 5 DAYS  Final   Report Status 06/25/2015 FINAL  Final  Urine culture     Status: None   Collection Time: 06/21/15 12:22 AM  Result Value Ref Range Status   Specimen Description URINE, CLEAN CATCH  Final   Special Requests Normal  Final   Culture 4,000 COLONIES/mL INSIGNIFICANT GROWTH  Final   Report Status 06/22/2015 FINAL  Final  MRSA PCR Screening     Status: None   Collection Time: 06/22/15  2:13 AM  Result Value Ref Range Status   MRSA by PCR NEGATIVE NEGATIVE Final    Comment:        The GeneXpert MRSA Assay (FDA approved for NASAL specimens only), is one component of a comprehensive MRSA colonization surveillance program. It is not intended to diagnose MRSA infection nor to guide or monitor treatment for MRSA infections.   MRSA PCR Screening     Status: None   Collection Time: 06/24/15  7:22 PM  Result Value Ref Range Status   MRSA by PCR NEGATIVE NEGATIVE Final    Comment:        The GeneXpert MRSA Assay (FDA approved for NASAL specimens only), is one component of a comprehensive MRSA colonization surveillance program. It is not intended to diagnose MRSA infection nor to guide or monitor treatment for MRSA infections.      Discharge Instructions:       Discharge Instructions    Call MD for:  persistant nausea and vomiting    Complete by:  As directed      Call MD for:  severe uncontrolled pain    Complete by:  As directed      Diet - low sodium heart healthy    Complete by:  As directed      Increase activity slowly    Complete by:  As directed      Walk with assistance    Complete by:  As directed      Walker     Complete by:  As directed             Medication List    STOP taking these medications        budesonide-formoterol 160-4.5 MCG/ACT inhaler    Commonly known as:  SYMBICORT     losartan 50 MG tablet  Commonly known as:  COZAAR     metoprolol tartrate 25 MG tablet  Commonly known as:  LOPRESSOR      TAKE these medications        acetaminophen 500 MG tablet  Commonly known as:  TYLENOL  Take 1,000 mg by mouth every 6 (six) hours as needed for moderate pain.     albuterol 108 (90 Base) MCG/ACT inhaler  Commonly known as:  PROVENTIL HFA;VENTOLIN HFA  Inhale 2 puffs into the lungs every 6 (six) hours as needed for wheezing or shortness of breath.     arformoterol 15 MCG/2ML Nebu  Commonly known as:  BROVANA  Take 2 mLs (15 mcg total) by nebulization 2 (two) times daily.     aspirin 81 MG EC tablet  Take 1 tablet (81 mg total) by mouth daily.     atorvastatin 80 MG tablet  Commonly known as:  LIPITOR  Take 1 tablet (80 mg total) by mouth daily at 6 PM.     bisacodyl 5 MG EC tablet  Commonly known as:  DULCOLAX  Take 5 mg by mouth daily as needed for moderate constipation.     bromocriptine 2.5 MG tablet  Commonly known as:  PARLODEL  Take 2.5 mg by mouth 2 (two) times daily.     budesonide 0.5 MG/2ML nebulizer solution  Commonly known as:  PULMICORT  Take 2 mLs (0.5 mg total) by nebulization 2 (two) times daily.     clopidogrel 75 MG tablet  Commonly known as:  PLAVIX  Take 1 tablet (75 mg total) by mouth daily with breakfast.     diazepam 5 MG tablet  Commonly known as:  VALIUM  Take 1 tablet (5 mg total) by mouth every 8 (eight) hours as needed for anxiety.     diphenoxylate-atropine 2.5-0.025 MG tablet  Commonly known as:  LOMOTIL  Take 2 tablets by mouth once.     dronabinol 5 MG capsule  Commonly known as:  MARINOL  Take 1 capsule (5 mg total) by mouth 2 (two) times daily before lunch and supper.     fentaNYL 25 MCG/HR patch  Commonly known as:  DURAGESIC - dosed mcg/hr  Place 1 patch (25 mcg total) onto the skin every 3 (three) days.     ferrous sulfate 325 (65 FE) MG tablet  Take 1 tablet  (325 mg total) by mouth 2 (two) times daily with a meal.     furosemide 20 MG tablet  Commonly known as:  LASIX  Take 1 tablet (20 mg total) by mouth daily.  Start taking on:  07/01/2015     levETIRAcetam 750 MG tablet  Commonly known as:  KEPPRA  Take 2 tablets (1,500 mg total) by mouth every 12 (twelve) hours.     nicotine 14 mg/24hr patch  Commonly known as:  NICODERM CQ  Place 1 patch (14 mg total) onto the skin daily.     nitroGLYCERIN 0.4 MG SL tablet  Commonly known as:  NITROSTAT  Place 1 tablet (0.4 mg total) under the tongue every 5 (five) minutes as needed for chest pain. FOR 3 DOSES ONLY     oxyCODONE 5 MG immediate release tablet  Commonly known as:  Oxy IR/ROXICODONE  Take 1 tablet (5 mg total) by mouth every 6 (six) hours as needed for moderate pain.     pantoprazole 40 MG tablet  Commonly known as:  PROTONIX  Take 1 tablet (40 mg total) by mouth 2 (two) times daily.     phenytoin 100 MG ER capsule  Commonly known as:  DILANTIN  Take 3 capsules (300 mg total) by mouth at bedtime.     phenytoin 50 MG tablet  Commonly known as:  DILANTIN  Chew 1 tablet (50 mg total) by mouth at bedtime. Along with 300 mg-for a total of 350 mg daily     predniSONE 20 MG tablet  Commonly known as:  DELTASONE  Take 1 tablet (  20 mg total) by mouth daily with breakfast.       Follow-up Information    Follow up with Quay Burow, MD On 07/26/2015.   Specialties:  Cardiology, Radiology   Why:  9:45AM.    Contact information:   [address] [place] [address] [phone]       Follow up with [facility] On 07/07/2015.   Specialty:  Cardiology   Why:  1:30PM. Right lower extremity arterial doppler   Contact information:   [address] [place] [phone]      Schedule an appointment as soon as possible for a visit with [name], MD.   Specialty:  Family Medicine   Why:  If symptoms  worsen   Contact information:   [address] [place] [address] [phone]        Time coordinating discharge: 35 minutes.  Signed:  RAMA,CHRISTINA  Pager [phone] [facility] 06/30/2015, 2:01 PM

## 2015-07-01 ENCOUNTER — Other Ambulatory Visit: Payer: Self-pay

## 2015-07-01 NOTE — Patient Outreach (Signed)
[facility]) Care Management  07/01/2015  Cristian Moss 08-11-1945 [id number]   Assessment: Member discharged from hospital on yesterday with [facility]. Spoke with member's daughter, per wife's request. Daughter expresses concerns that the nurse and nursing assistant has not been out to see member yet. Daughter states she was getting ready to call Hospice. Daughter reports they have delivered the equipment, states she has the nebulizer machine, but does not have the medications.   RNCM discussed case closure due to care is now being managed by Hospice. Daughter acknowledged understanding. And is aware that she can call RNCM if she has any additional questions/concerns. Daughter confirmed that she has RNCM's contact information.  RNCM called and spoke with Bambi with [facility]-Informed of daughter's concerns. Bambi reports both the nurse and nursing assistant will see member today and that she has spoken with daughter regarding member's medication. Bambi stated she would reach out to the family again to help with easing concerns/fears.   Plan: Close case.  [name], RN, MSN, [place] Coordinator Cell: [phone]

## 2015-07-04 ENCOUNTER — Other Ambulatory Visit: Payer: Self-pay | Admitting: Licensed Clinical Social Worker

## 2015-07-04 ENCOUNTER — Telehealth: Payer: Self-pay | Admitting: *Deleted

## 2015-07-04 NOTE — Patient Outreach (Signed)
[facility]) Care Management  07/04/2015  Cristian Moss 05-03-1945 [id number]   Assessment-CSW will complete case closure at this time as Hospice is now involved with patient.  Plan-CSW will send case closure to PCP. CSW will notify [place] Management Assistant.  Eula Fried, BSW, MSW, [email or web address] Phone: [phone] Fax: [phone]

## 2015-07-05 ENCOUNTER — Other Ambulatory Visit: Payer: Self-pay | Admitting: Cardiovascular Disease

## 2015-07-05 DIAGNOSIS — I739 Peripheral vascular disease, unspecified: Secondary | ICD-10-CM

## 2015-07-07 ENCOUNTER — Encounter (HOSPITAL_COMMUNITY): Payer: Self-pay

## 2015-07-13 ENCOUNTER — Ambulatory Visit: Payer: Self-pay | Admitting: Pulmonary Disease

## 2015-07-21 ENCOUNTER — Ambulatory Visit: Payer: Self-pay | Admitting: Neurology

## 2015-07-25 DEATH — deceased

## 2015-07-26 ENCOUNTER — Ambulatory Visit: Payer: Self-pay | Admitting: Cardiovascular Disease

## 2015-08-23 ENCOUNTER — Ambulatory Visit: Payer: Self-pay | Admitting: Cardiovascular Disease

## 2016-06-29 IMAGING — CR DG CHEST 1V PORT
1 series · 1 of 1 positions shown · non-contrast
Comparison: 06/08/2015

CLINICAL DATA: Tachycardia, chills, elevated white blood cells

EXAM:
PORTABLE CHEST 1 VIEW

[AP]
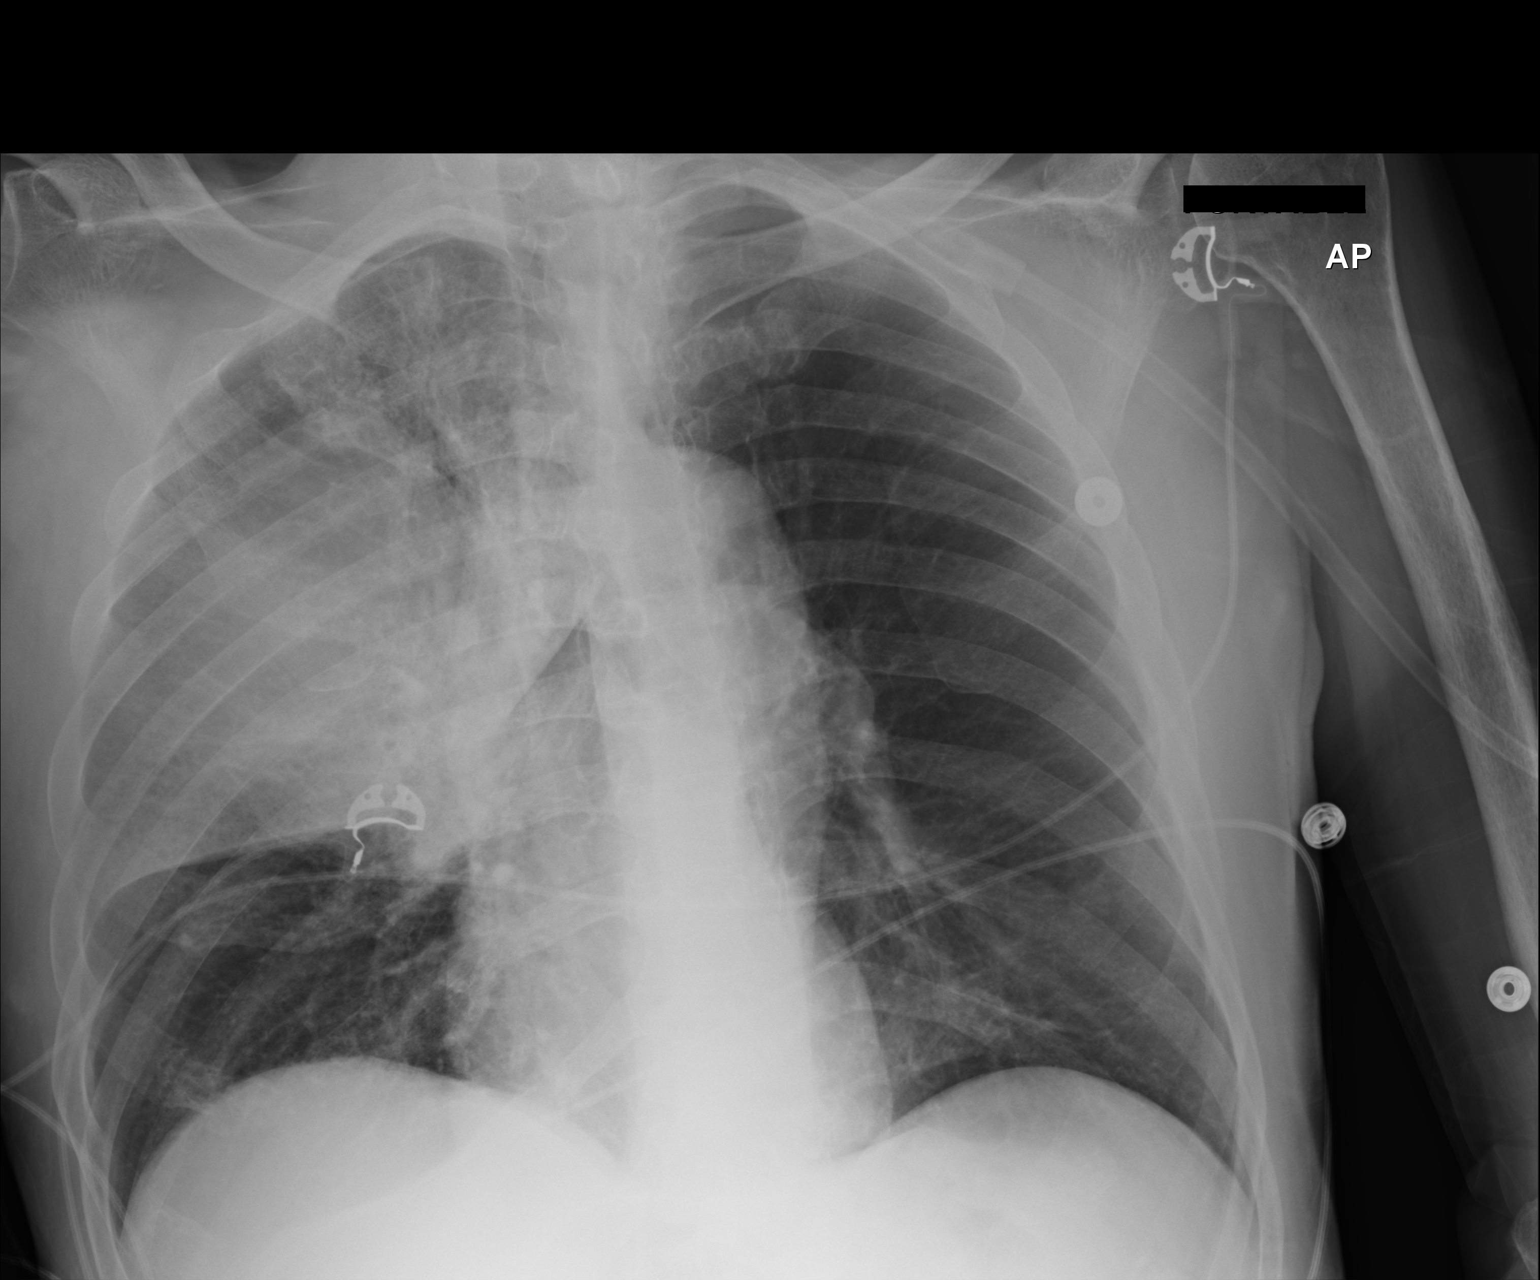

[1 of 1 positions shown; findings below may reference images not displayed]

FINDINGS: There is right upper lobe consolidation. There is no pleural
effusion or pneumothorax. The heart and mediastinal contours are
unremarkable.

The osseous structures are unremarkable.
IMPRESSION: Right upper lobe consolidation most concerning for pneumonia. Given
the masslike area in the right upper lobe on previous CT of the
chest dated 06/09/2015 this is concerning for possible
intrapulmonary abscess.

## 2016-07-03 IMAGING — CR DG CHEST 1V PORT
1 series · 1 of 1 positions shown · non-contrast
Comparison: 06/22/2015 and CT 06/08/2015

CLINICAL DATA: Cavitating right upper lobe lung mass.

EXAM:
PORTABLE CHEST 1 VIEW

[AP]
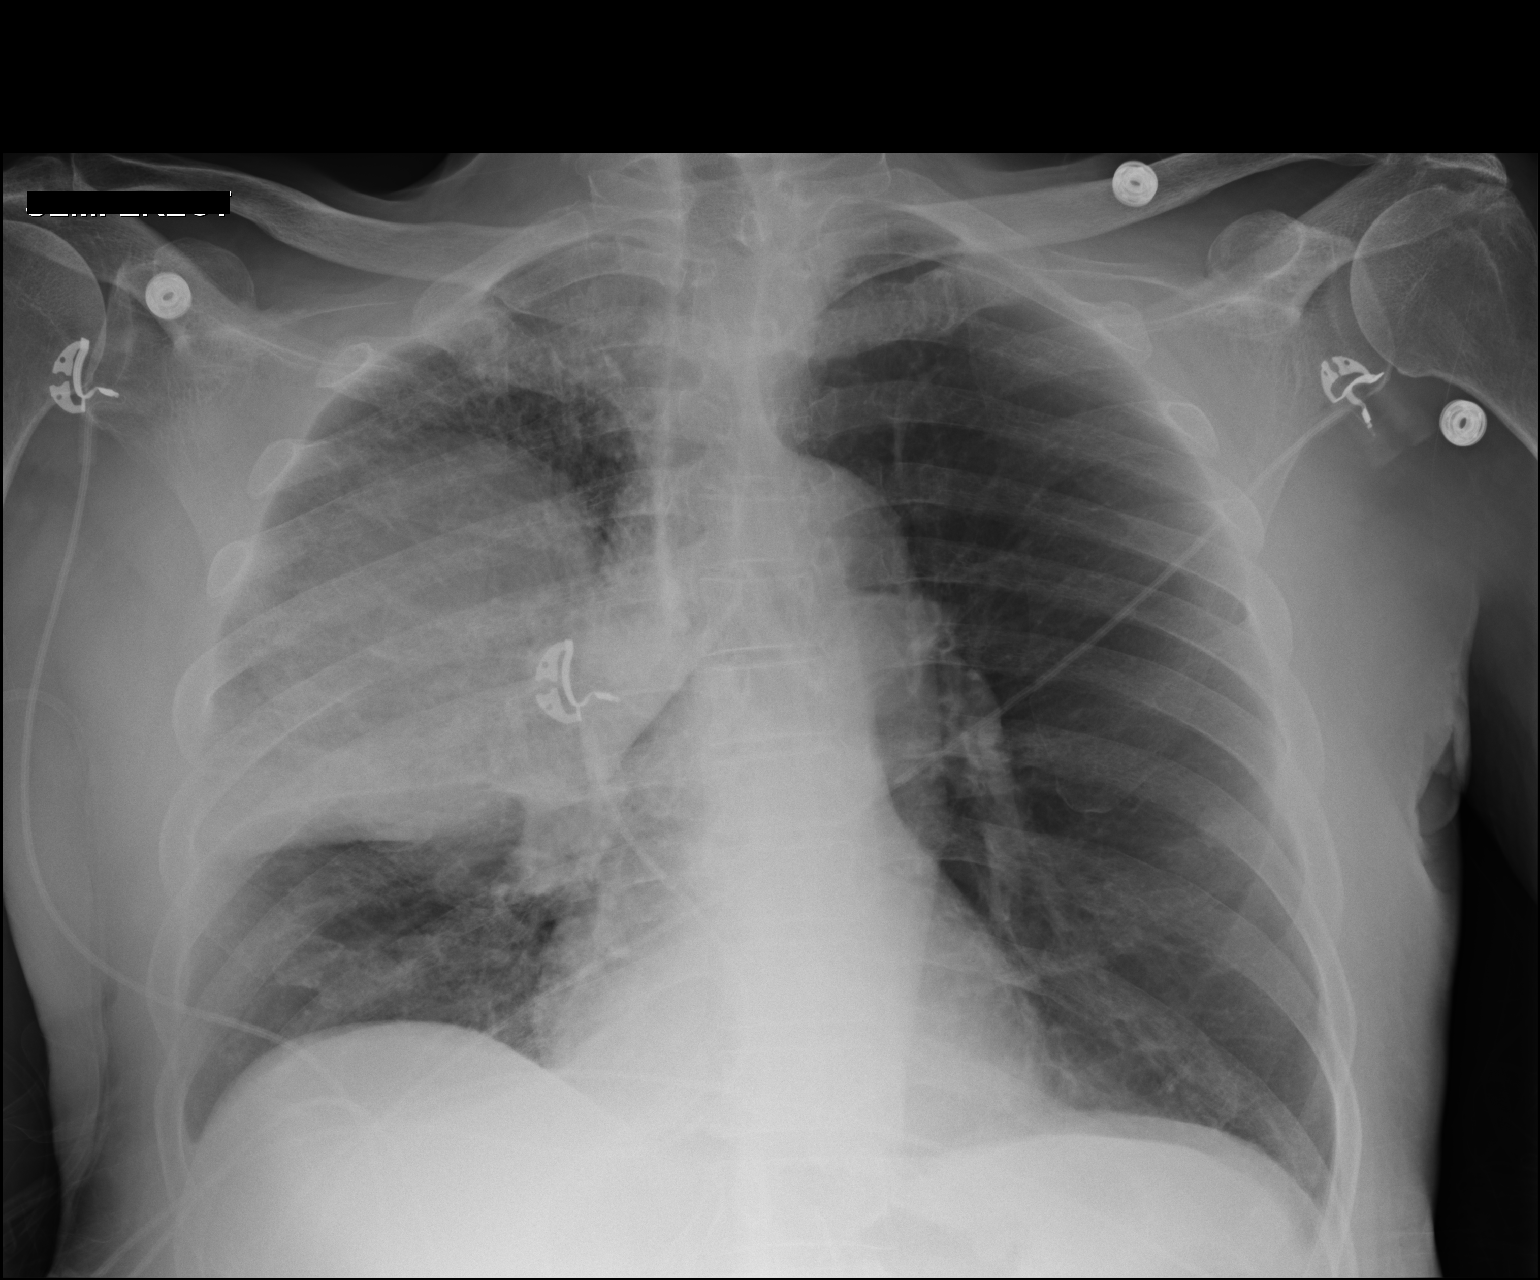

[1 of 1 positions shown; findings below may reference images not displayed]

FINDINGS: Examination demonstrates adequate lung volumes with no significant
change in patient's known large right lung mass which has undergone
recent percutaneous biopsy. There is less interstitial density
adjacent to the mass. No significant effusion. No evidence of
pneumothorax. Cardiomediastinal silhouette and remainder of the exam
is unchanged.
IMPRESSION: Stable known large right upper lobe lung mass post recent
percutaneous biopsy. Near resolution of previously seen
heterogeneous density adjacent the mass.

## 2016-07-03 IMAGING — CT CT BIOPSY
1 of 2 series · 14 of 32 positions shown, 19 images · non-contrast
Comparison: none

INDICATION: Enlarging posterior right upper lobe mass

[Series 2: i-spiral 5.0 b40f · axial · 0.73mm/px · z∈[+1335,+1566]mm · 14 of 74 slices shown, 19 images]
[im 4/74  soft-tissue]
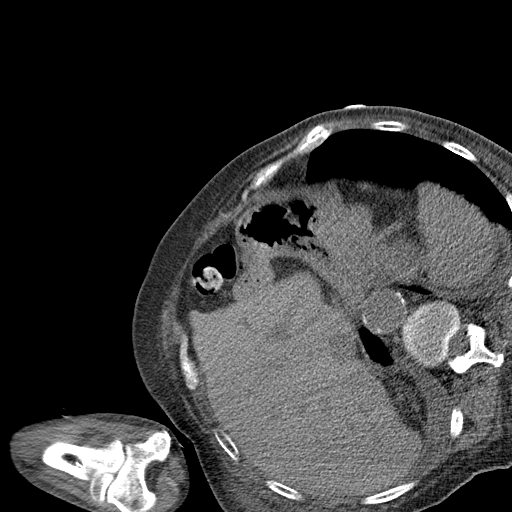
[im 4/74  bone]
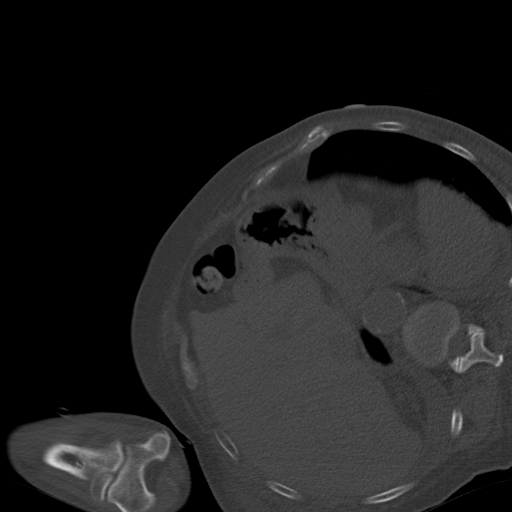
[im 11/74  soft-tissue]
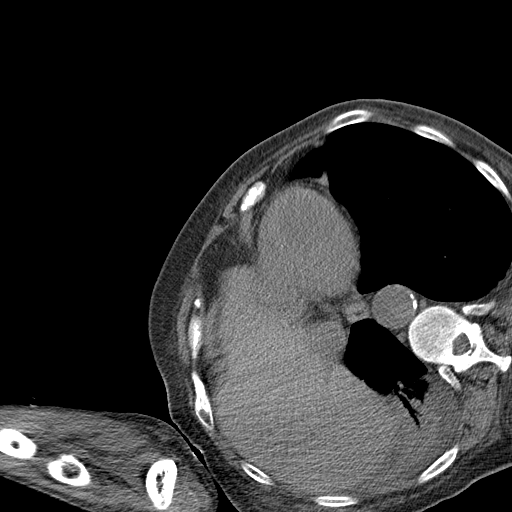
[im 14/74  soft-tissue]
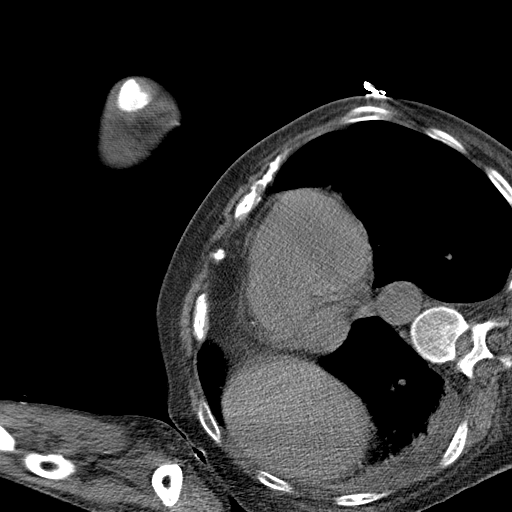
[im 21/74  soft-tissue]
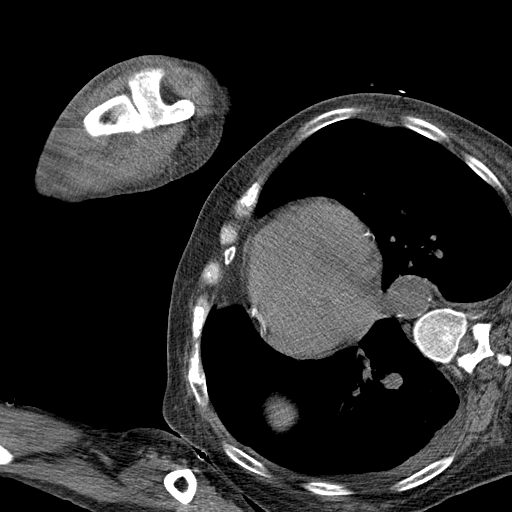
[im 25/74  soft-tissue]
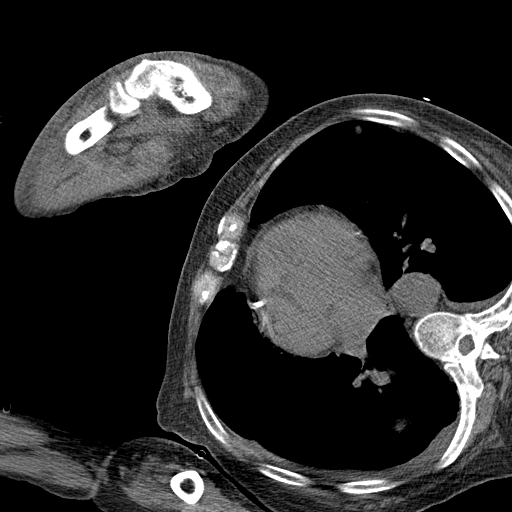
[im 32/74  soft-tissue]
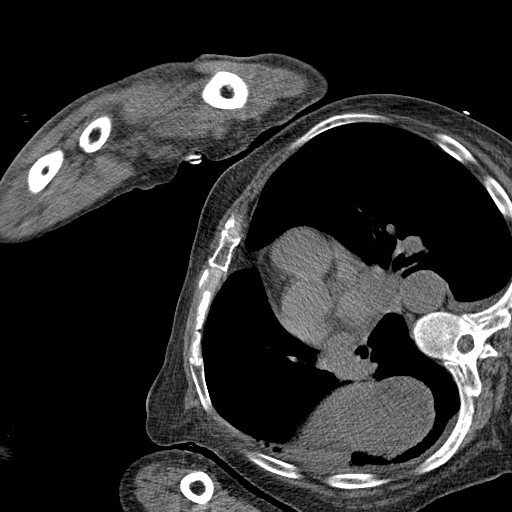
[im 39/74  soft-tissue]
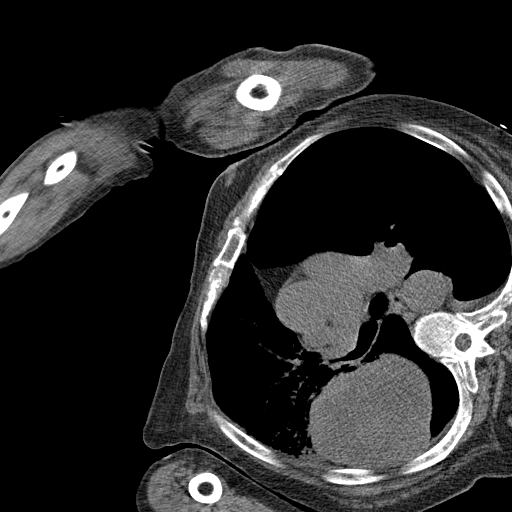
[im 42/74  soft-tissue]
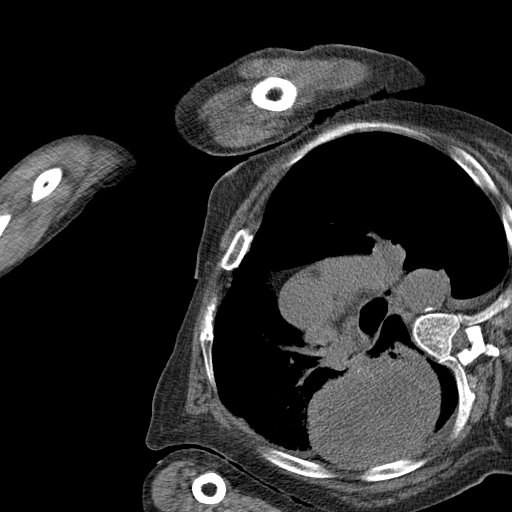
[im 49/74  soft-tissue]
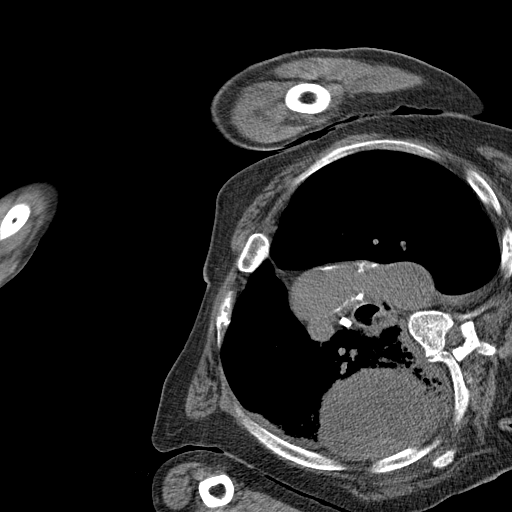
[im 49/74  bone]
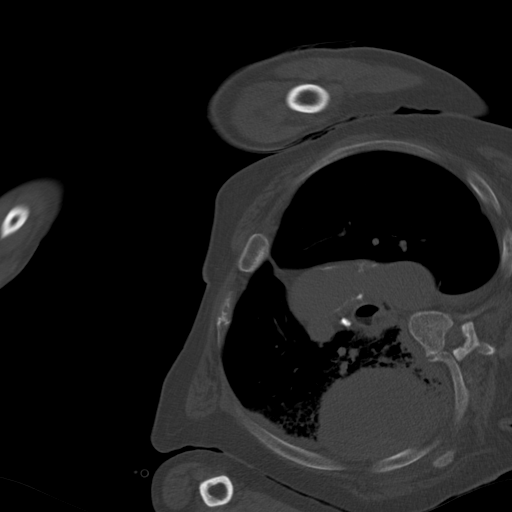
[im 53/74  soft-tissue]
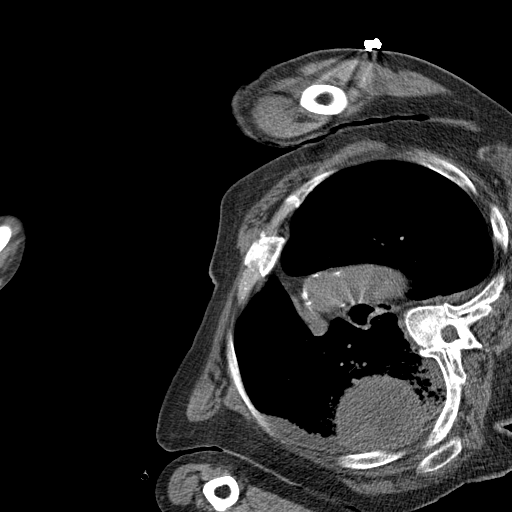
[im 60/74  soft-tissue]
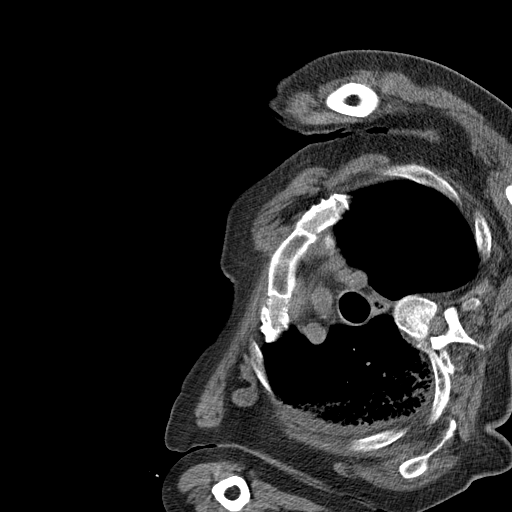
[im 60/74  lung]
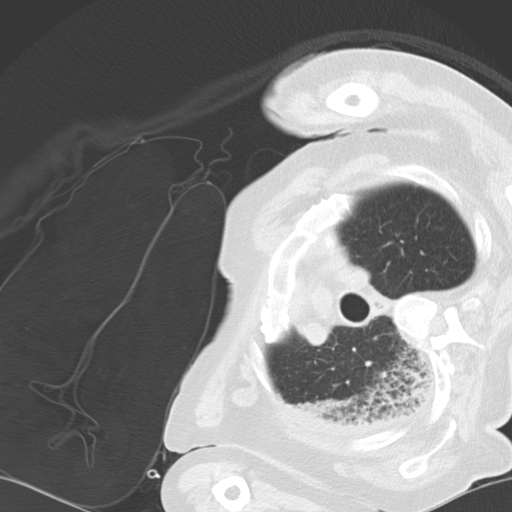
[im 63/74  soft-tissue]
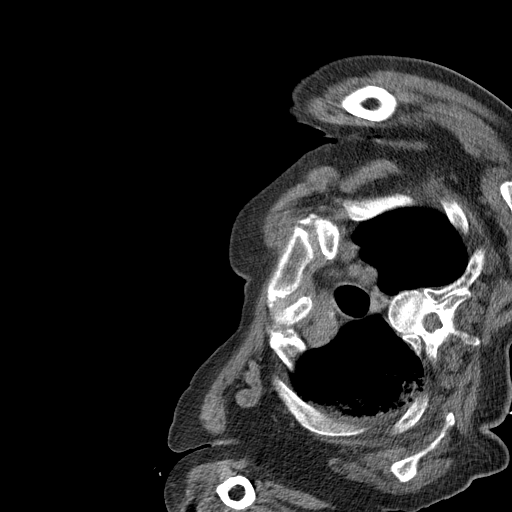
[im 63/74  lung]
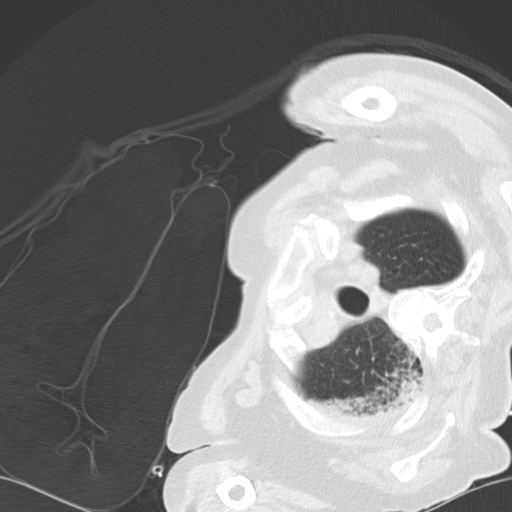
[im 67/74  lung]
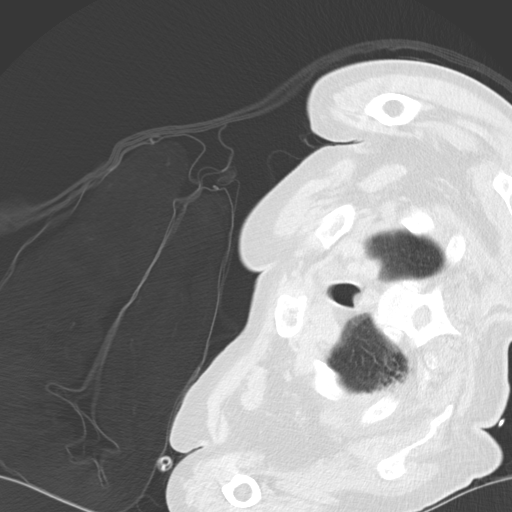
[im 70/74  soft-tissue]
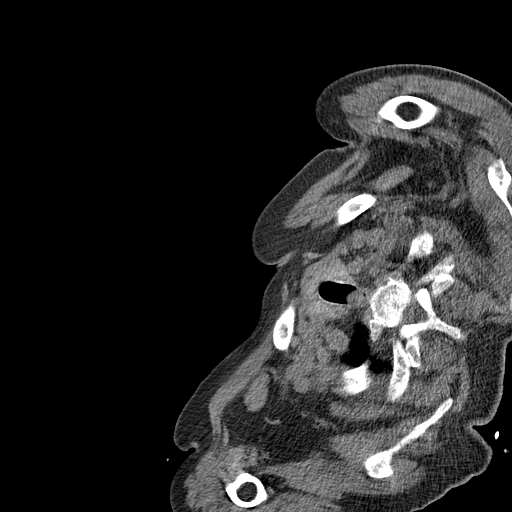
[im 70/74  lung]
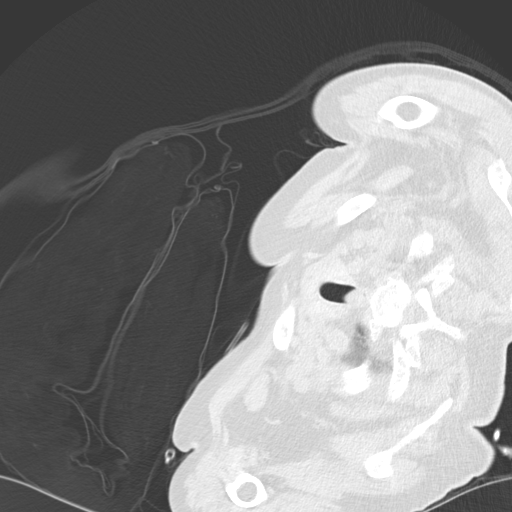

[14 of 32 positions shown; findings below may reference images not displayed]

EXAM:
CT-GUIDED BIOPSY LARGE RIGHT UPPER LOBE CENTRALLY NECROTIC MASS

MEDICATIONS:
1% LIDOCAINE LOCALLY

ANESTHESIA/SEDATION:
1.0 mg IV Versed; 25 mcg IV Fentanyl

Moderate Sedation Time:  20 MINUTES

The patient was continuously monitored during the procedure by the
interventional radiology nurse under my direct supervision.

PROCEDURE:
The procedure, risks, benefits, and alternatives were explained to
the patient. Questions regarding the procedure were encouraged and
answered. The patient understands and consents to the procedure.

The right posterior back was prepped with ChloraPrep in a sterile
fashion, and a sterile drape was applied covering the operative
field. A sterile gown and sterile gloves were used for the
procedure.

Previous imaging reviewed. Patient positioned right side down
decubitus. Noncontrast localization CT performed. The large
posterior right upper lobe mass was localized. Under sterile
conditions and local anesthesia, a 17 gauge 6.8 cm access needle was
advanced from a posterior intercostal approach to the periphery of
the right upper lobe mass. Needle position confirmed with CT. 18
gauge core biopsies obtained and placed in formalin. Needle tract
embolized with the biosentry device to aide in pneumothorax
prevention. Postprocedure imaging demonstrates no complication.

Patient tolerated the procedure well without complication. Vital
sign monitoring by nursing staff during the procedure will continue
as patient is in the special procedures unit for post procedure
observation.
FINDINGS: The images document guide needle placement within the large
posterior right upper lobe mass. Post biopsy images demonstrate no
effusion or pneumothorax.

COMPLICATIONS:
None immediate.
IMPRESSION: Successful CT-guided core biopsy of the right upper lobe mass.

## 2018-08-22 ENCOUNTER — Other Ambulatory Visit: Payer: Self-pay

## 2018-08-29 NOTE — Patient Outreach (Signed)
[facility]) Care Management  08/29/2018  Cristian Moss 04/04/1945 [id number]    Encounter opened in error.  [name], RN, MSN, [place] [phone]
